# Patient Record
Sex: Male | Born: 1955 | Race: White | Hispanic: No | Marital: Married | Smoking: Former smoker
Health system: Southern US, Community
[De-identification: ages and names within clinical notes are randomized; demographics above are authoritative.]

## PROBLEM LIST (undated history)

## (undated) DIAGNOSIS — M19079 Primary osteoarthritis, unspecified ankle and foot: Secondary | ICD-10-CM

## (undated) DIAGNOSIS — I82409 Acute embolism and thrombosis of unspecified deep veins of unspecified lower extremity: Secondary | ICD-10-CM

## (undated) DIAGNOSIS — Z5189 Encounter for other specified aftercare: Secondary | ICD-10-CM

## (undated) DIAGNOSIS — D689 Coagulation defect, unspecified: Secondary | ICD-10-CM

## (undated) DIAGNOSIS — R519 Headache, unspecified: Secondary | ICD-10-CM

## (undated) DIAGNOSIS — R51 Headache: Secondary | ICD-10-CM

## (undated) DIAGNOSIS — I509 Heart failure, unspecified: Secondary | ICD-10-CM

## (undated) DIAGNOSIS — I739 Peripheral vascular disease, unspecified: Secondary | ICD-10-CM

## (undated) DIAGNOSIS — M545 Low back pain, unspecified: Secondary | ICD-10-CM

## (undated) DIAGNOSIS — Z944 Liver transplant status: Secondary | ICD-10-CM

## (undated) DIAGNOSIS — K635 Polyp of colon: Secondary | ICD-10-CM

## (undated) DIAGNOSIS — R030 Elevated blood-pressure reading, without diagnosis of hypertension: Secondary | ICD-10-CM

## (undated) DIAGNOSIS — T7840XA Allergy, unspecified, initial encounter: Secondary | ICD-10-CM

## (undated) DIAGNOSIS — C801 Malignant (primary) neoplasm, unspecified: Secondary | ICD-10-CM

## (undated) DIAGNOSIS — S32000A Wedge compression fracture of unspecified lumbar vertebra, initial encounter for closed fracture: Secondary | ICD-10-CM

## (undated) DIAGNOSIS — I2699 Other pulmonary embolism without acute cor pulmonale: Secondary | ICD-10-CM

## (undated) DIAGNOSIS — K7460 Unspecified cirrhosis of liver: Secondary | ICD-10-CM

## (undated) DIAGNOSIS — E119 Type 2 diabetes mellitus without complications: Secondary | ICD-10-CM

## (undated) HISTORY — DX: Polyp of colon: K63.5

## (undated) HISTORY — DX: Acute embolism and thrombosis of unspecified deep veins of unspecified lower extremity: I82.409

## (undated) HISTORY — DX: Primary osteoarthritis, unspecified ankle and foot: M19.079

## (undated) HISTORY — DX: Coagulation defect, unspecified: D68.9

## (undated) HISTORY — DX: Low back pain: M54.5

## (undated) HISTORY — PX: FOOT SURGERY: SHX648

## (undated) HISTORY — PX: LAMINECTOMY: SHX219

## (undated) HISTORY — DX: Low back pain, unspecified: M54.50

## (undated) HISTORY — DX: Wedge compression fracture of unspecified lumbar vertebra, initial encounter for closed fracture: S32.000A

## (undated) HISTORY — PX: FRACTURE SURGERY: SHX138

## (undated) HISTORY — DX: Liver transplant status: Z94.4

## (undated) HISTORY — DX: Allergy, unspecified, initial encounter: T78.40XA

## (undated) HISTORY — PX: LIVER TRANSPLANT: SHX410

## (undated) HISTORY — DX: Encounter for other specified aftercare: Z51.89

## (undated) HISTORY — PX: HAND SURGERY: SHX662

## (undated) HISTORY — DX: Elevated blood-pressure reading, without diagnosis of hypertension: R03.0

## (undated) HISTORY — DX: Other pulmonary embolism without acute cor pulmonale: I26.99

## (undated) HISTORY — DX: Type 2 diabetes mellitus without complications: E11.9

---

## 1998-05-08 ENCOUNTER — Ambulatory Visit (HOSPITAL_BASED_OUTPATIENT_CLINIC_OR_DEPARTMENT_OTHER): Admission: RE | Admit: 1998-05-08 | Discharge: 1998-05-08 | Payer: Self-pay | Admitting: Orthopedic Surgery

## 2008-04-28 ENCOUNTER — Inpatient Hospital Stay: Payer: Self-pay | Admitting: Internal Medicine

## 2008-04-28 IMAGING — CR DG CHEST 2V
1 series · 2 of 2 positions shown · non-contrast
Comparison: none

REASON FOR EXAM: Chest pain
COMMENTS:

PROCEDURE:     DXR - DXR CHEST PA (OR AP) AND LATERAL  - [DATE]  [DATE]
RESULT:     The lungs are adequately inflated. There is no focal infiltrate.
The heart is normal in size. The pulmonary vascularity is not engorged.

[Series 1: view not recorded · 0.17mm/px · 2 of 2 slices shown]
[im 1/2]
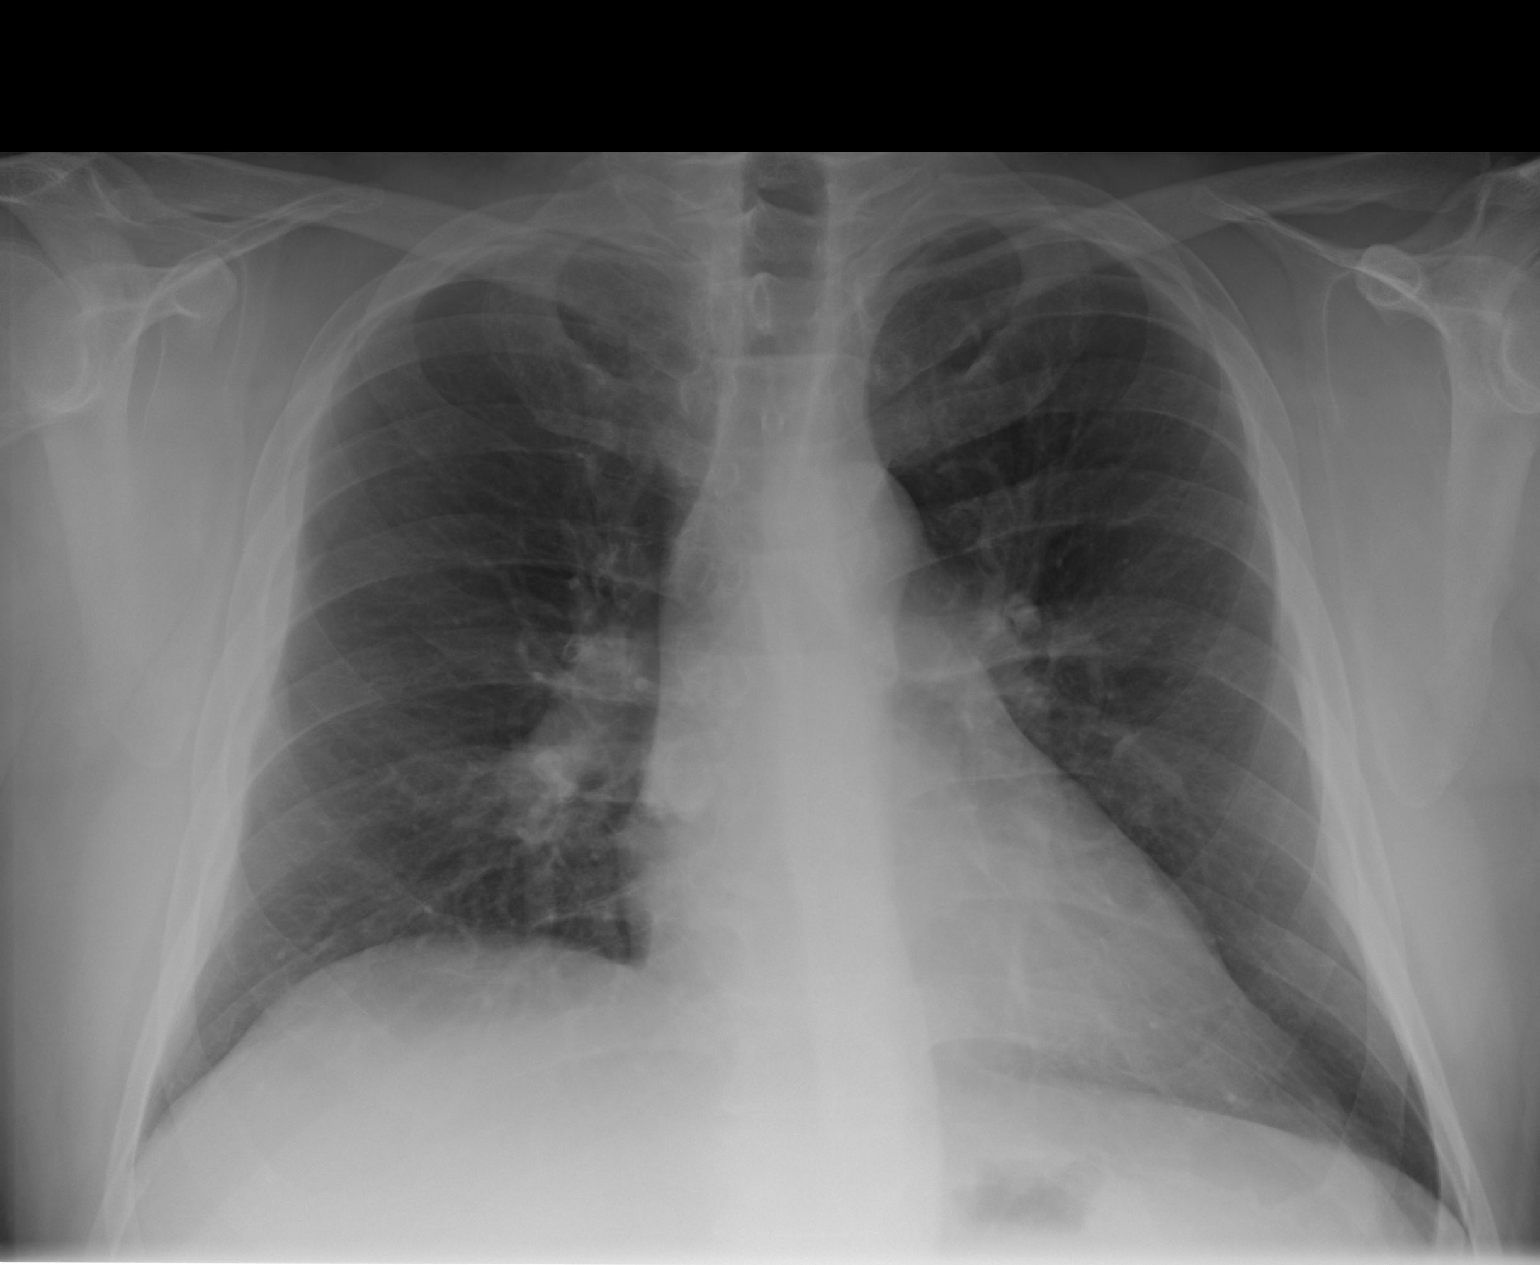
[im 2/2]
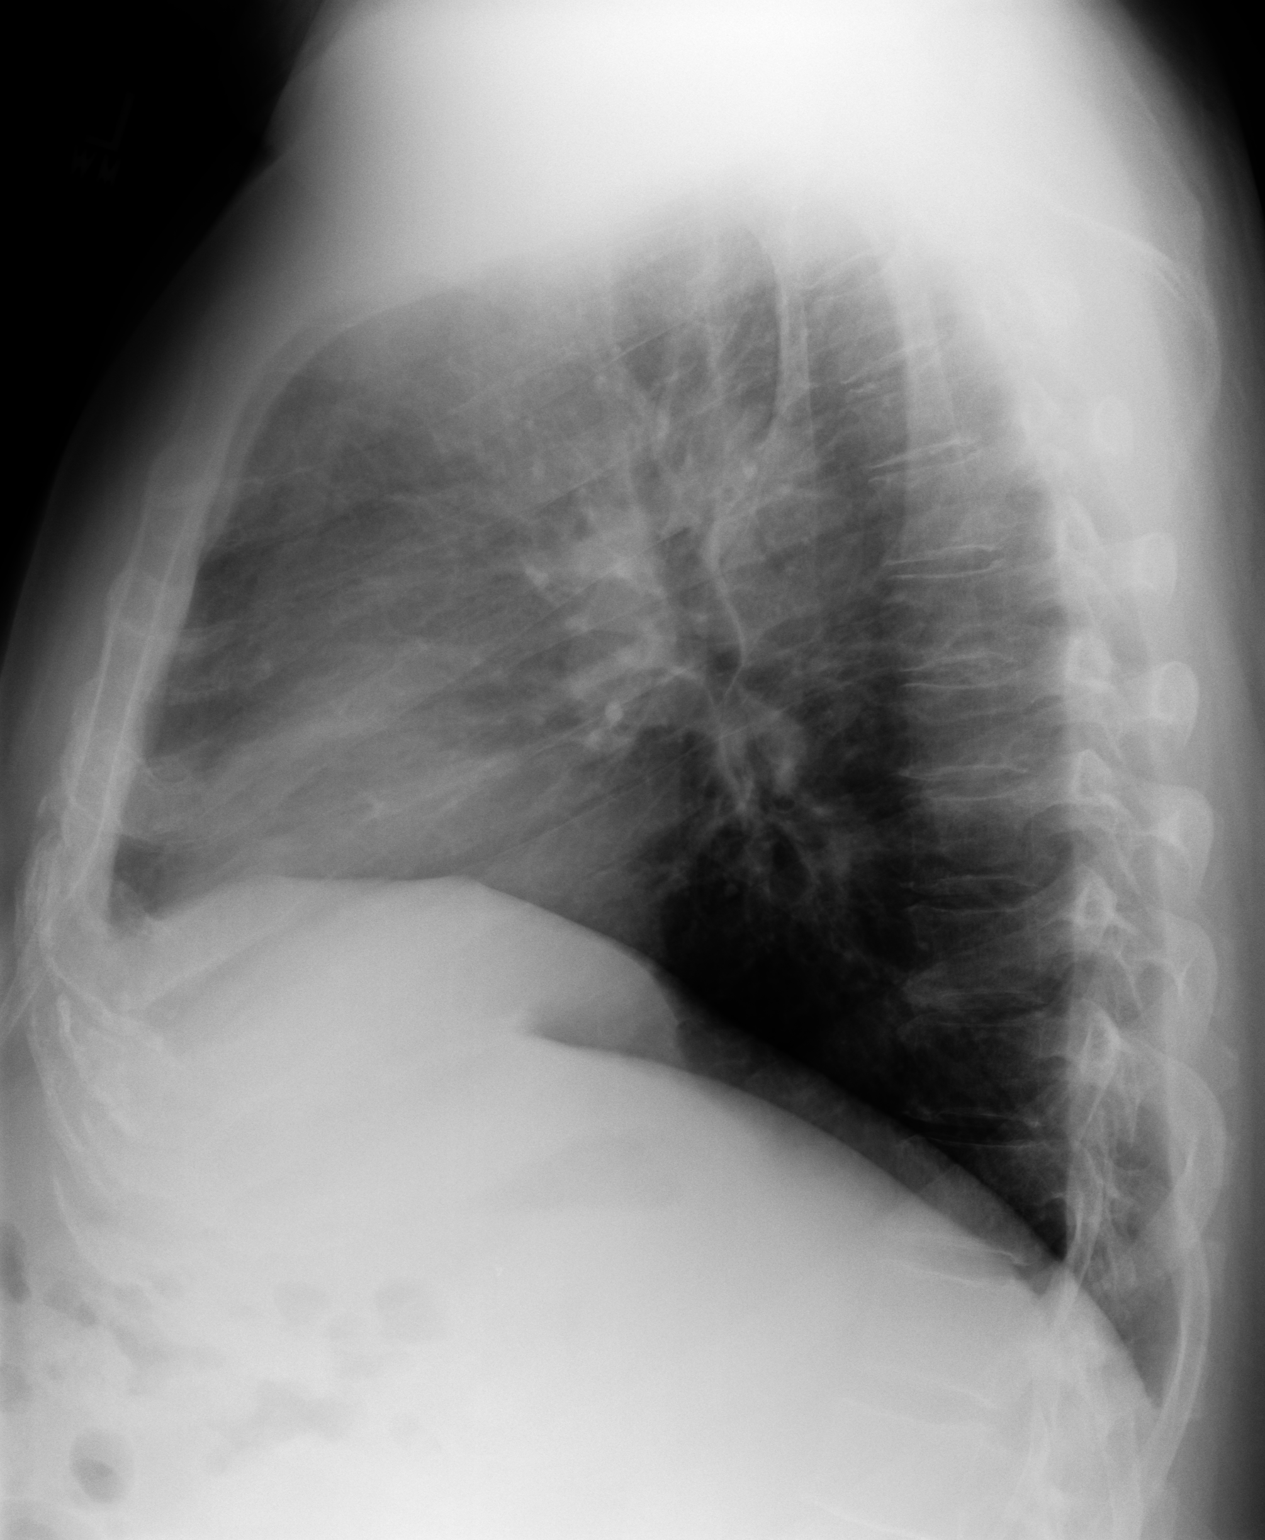

[2 of 2 positions shown; findings below may reference images not displayed]

IMPRESSION: I do not see evidence of acute cardiopulmonary abnormality.
Follow-up imaging is available if the patient's symptoms persist and remain
unexplained.

## 2008-04-28 IMAGING — CT CT CHEST W/O CM
1 series · 15 of 33 positions shown, 19 images · non-contrast
Comparison: none

REASON FOR EXAM: JIM lymphadenopathy allergic to dye
COMMENTS:

PROCEDURE:     CT  - CT CHEST WITHOUT CONTRAST  - [DATE] [DATE]
RESULT:     Comparison: No comparison
INDICATION: Chest pain
TECHNIQUE: Multiple axial images of the chest are obtained without
intravenous contrast.

[Series 3: soft tissue · axial · 0.84mm/px · z∈[+126,+416]mm · 15 of 70 slices shown, 19 images]
[im 6/70  mediastinal]
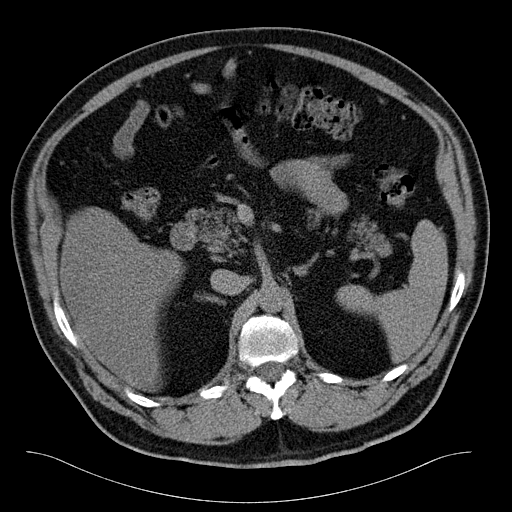
[im 6/70  lung]
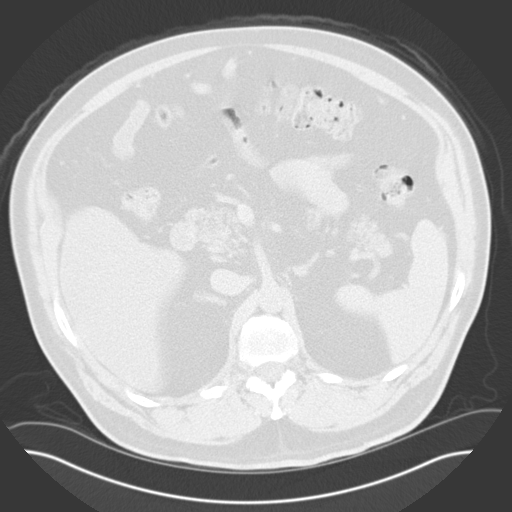
[im 11/70  lung]
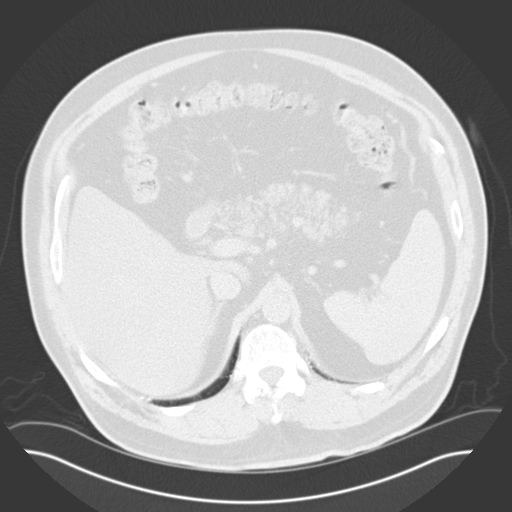
[im 14/70  lung]
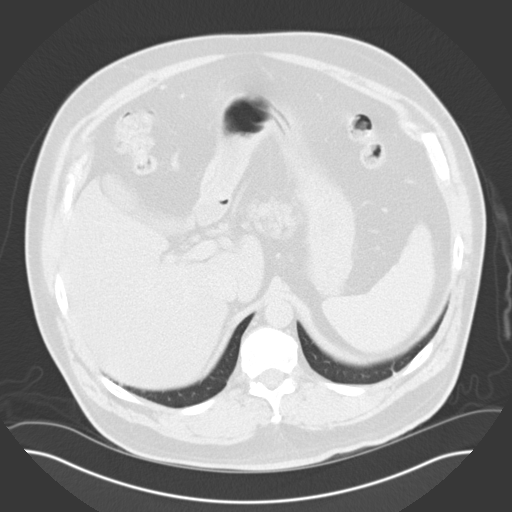
[im 18/70  lung]
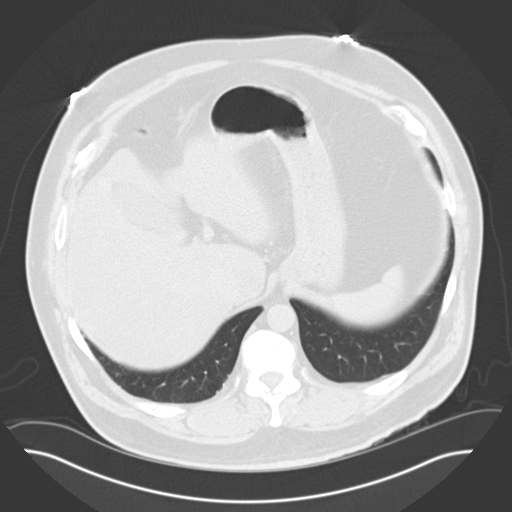
[im 24/70  mediastinal]
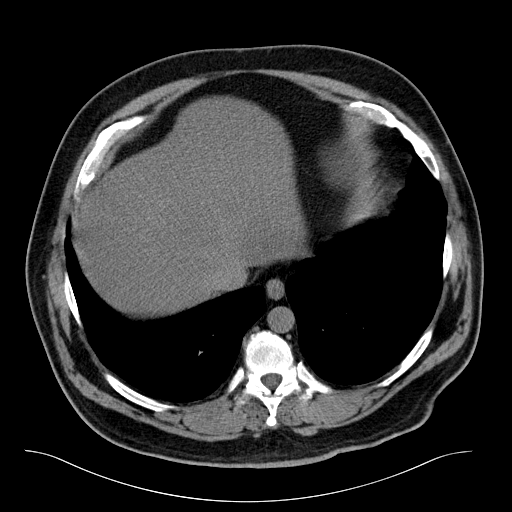
[im 24/70  lung]
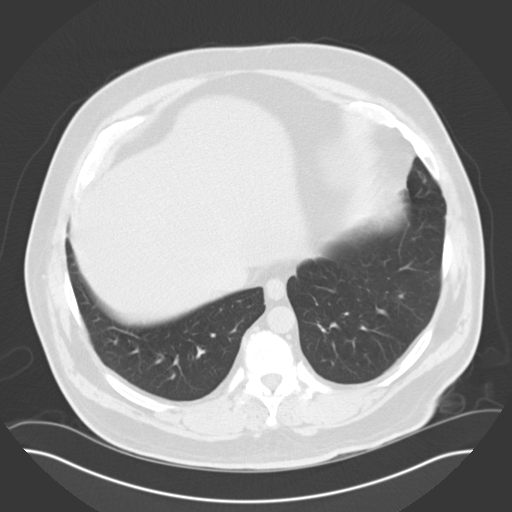
[im 28/70  lung]
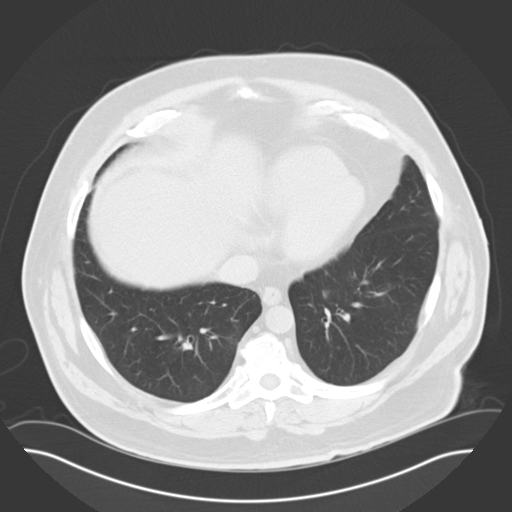
[im 31/70  lung]
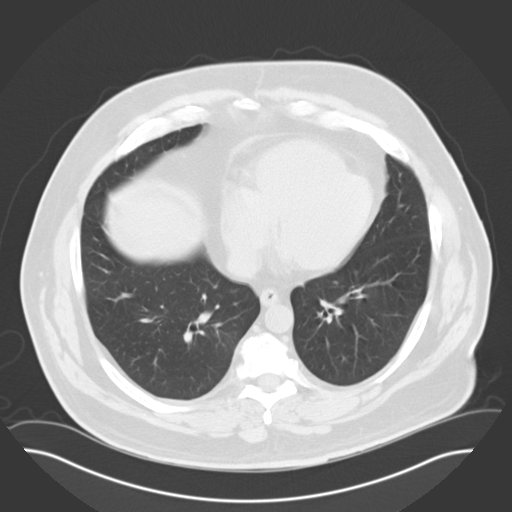
[im 36/70  lung]
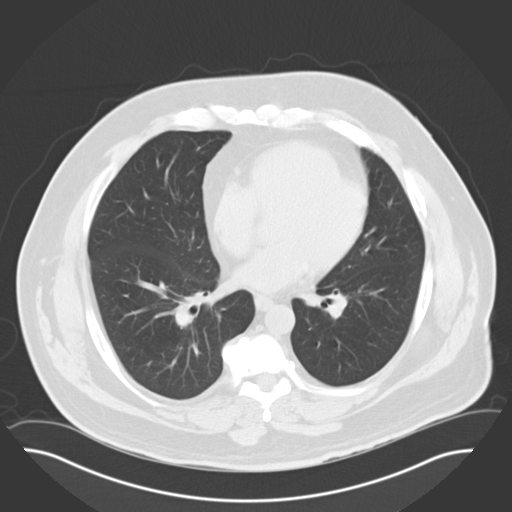
[im 39/70  mediastinal]
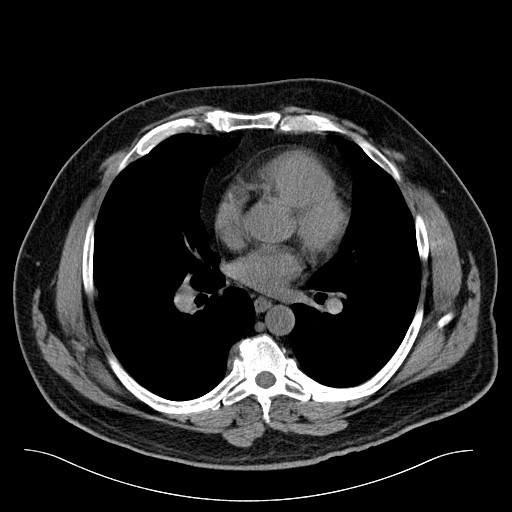
[im 39/70  lung]
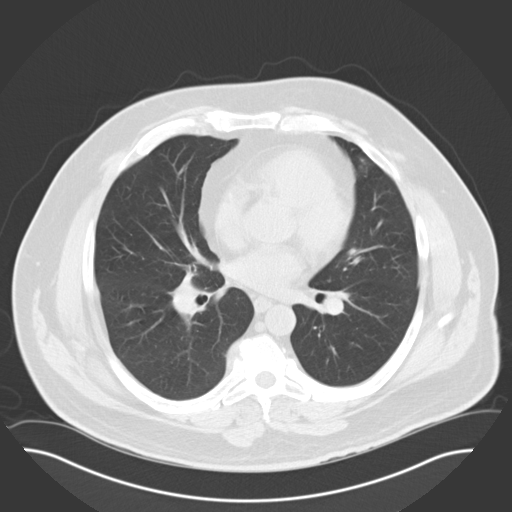
[im 42/70  lung]
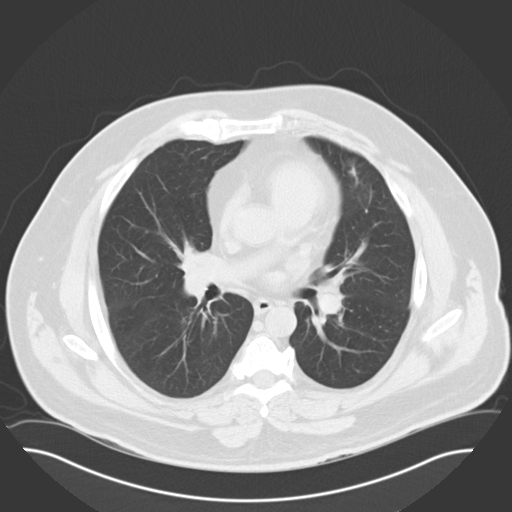
[im 47/70  lung]
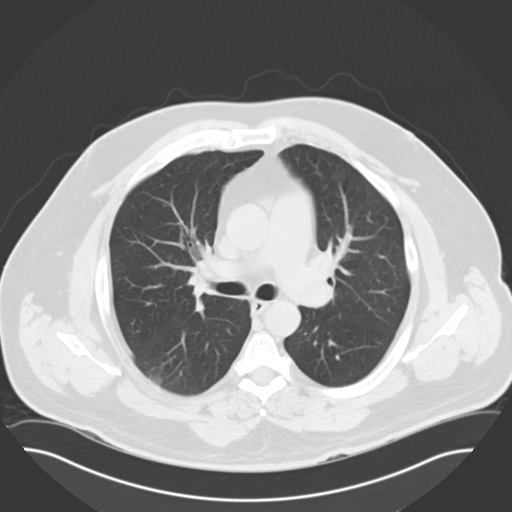
[im 52/70  lung]
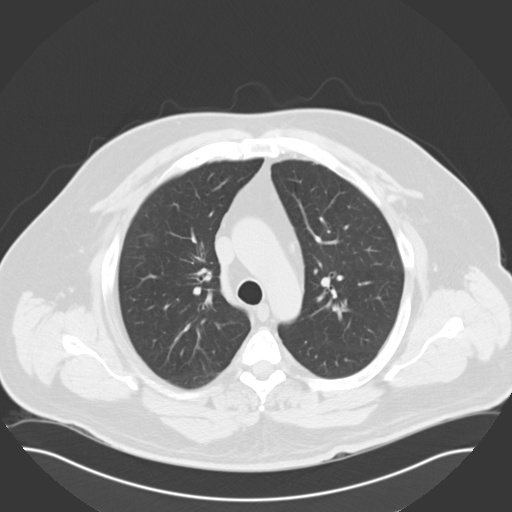
[im 56/70  mediastinal]
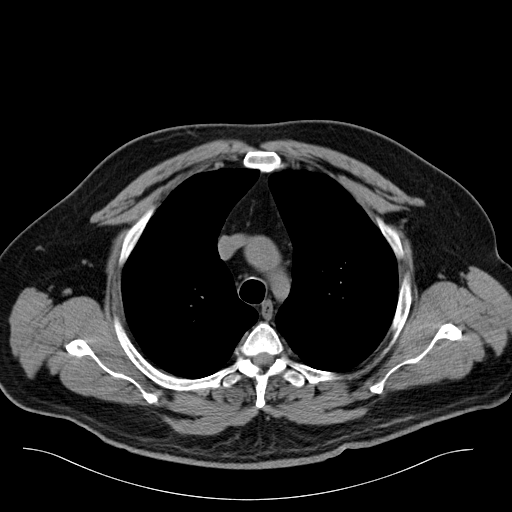
[im 56/70  lung]
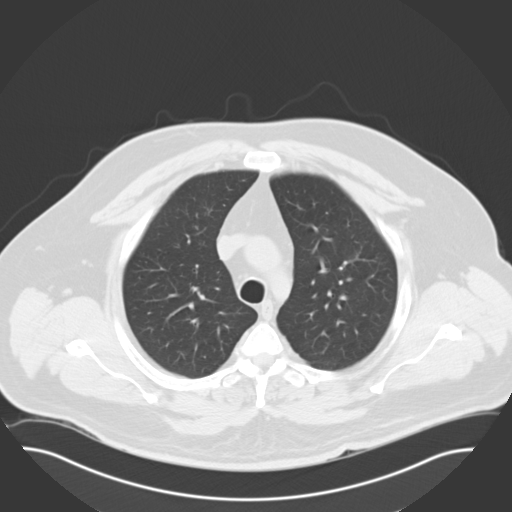
[im 59/70  lung]
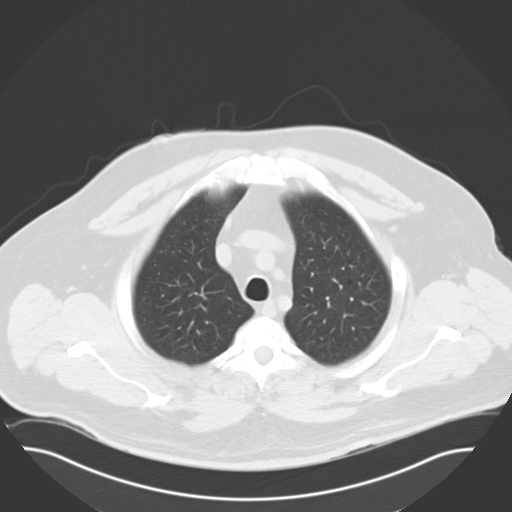
[im 64/70  lung]
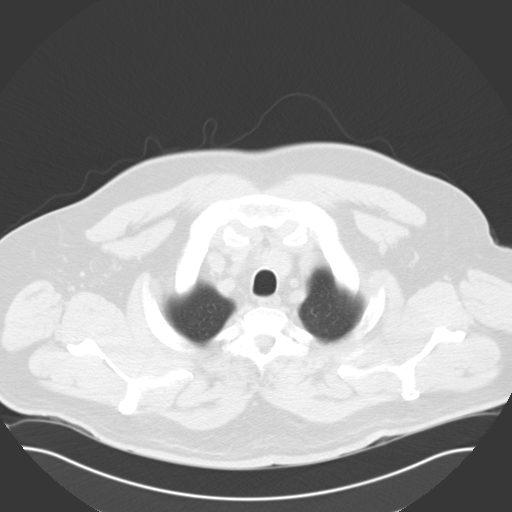

[15 of 33 positions shown; findings below may reference images not displayed]

FINDINGS: The central airways are patent. There is faint airspace disease in the left
upper lobe. There is no pleural effusion or pneumothorax.

There are no pathologically enlarged axillary or mediastinal lymph nodes.
There is no gross hilar lymphadenopathy, but evaluation is limited secondary
to lack of intravenous contrast.

The heart size is normal. There is no pericardial effusion. The thoracic
aorta is normal in caliber.

Review of bone windows demonstrates no focal lytic or sclerotic lesions.

Limited noncontrast images of the upper abdomen were obtained. The adrenal
glands appear normal. The liver is diffusely low in attenuation most
consistent with hepatic steatosis.
IMPRESSION: Small area of faint airspace disease in the left upper lobe which may
represent atelectasis versus developing infiltrate.

## 2008-04-29 IMAGING — NM NM LUNG SCAN
2 series · 14 of 14 positions shown · non-contrast
Comparison: none

REASON FOR EXAM: chest pain, pos d dimer, + dvt
COMMENTS:   LMP: (Male)

[Series 1000: lung perfusion · 1.65mm/px · 3 acquisitions, 6 frames shown]
[im 1/3]
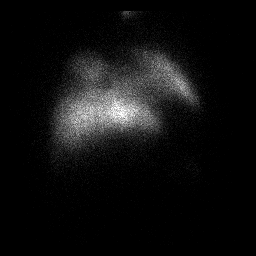
[im 1/3]
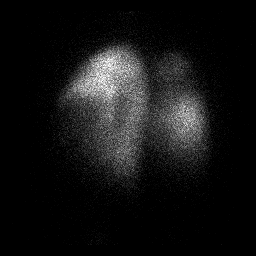
[im 2/3  full-range]
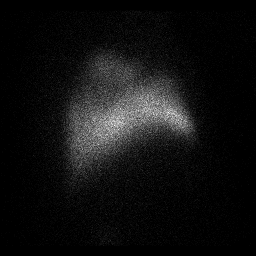
[im 2/3  full-range]
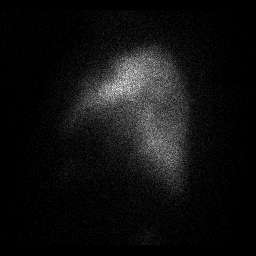
[im 3/3]
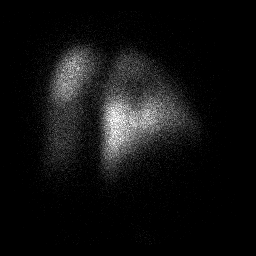
[im 3/3]
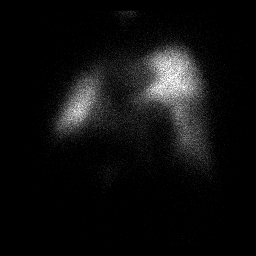

[Series 1000: lung ventilation · 3.30mm/px · 4 acquisitions, 8 frames shown]
[im 1/4]
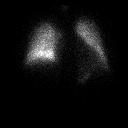
[im 1/4]
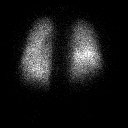
[im 2/4]
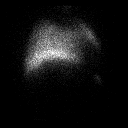
[im 2/4]
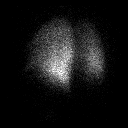
[im 3/4]
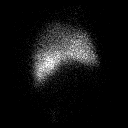
[im 3/4]
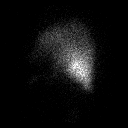
[im 4/4]
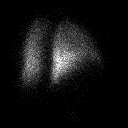
[im 4/4]
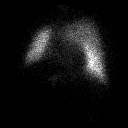

[14 of 14 positions shown; findings below may reference images not displayed]

PROCEDURE:     NM  - NM VQ LUNG SCAN  - [DATE]  [DATE] [DATE]  [DATE]

RESULT:     The patient received 37.2 mCi of technetium 99m labeled DTPA for
the ventilation study via nebulizer. The patient received 4.79 mCi of
technetium 99m labeled MAA for the perfusion study. The patient has known
positive D-dimer and lower extremitydeep venous thrombosis and  is
complaining of chest discomfort. Comparison is made to a chest x-ray [DATE].

There is relatively is symmetric distribution of the radiopharmaceutical
over both lungs on the ventilation study. On the perfusion study however
much of the left mid and lower lung and the right upper lung exhibit
photopenia consistent with pulmonary embolism.
IMPRESSION: The findings are consistent with high probability for acute
pulmonary embolism involving principally the left mid and lower lung and the
right upper lung.

Dr. BOJAN was page with this result at[DATE] on [DATE].at
the conclusion of the study.

Critical Value

## 2008-04-29 IMAGING — US US EXTREM LOW VENOUS BILAT
1 series · 17 of 24 positions shown · non-contrast
Comparison: none

REASON FOR EXAM: Elevated D-Ddimer, evaluate for DVT
COMMENTS:

[Series 1: us extrem low venous bilat · 17 of 45 slices shown]
[im 1/45]
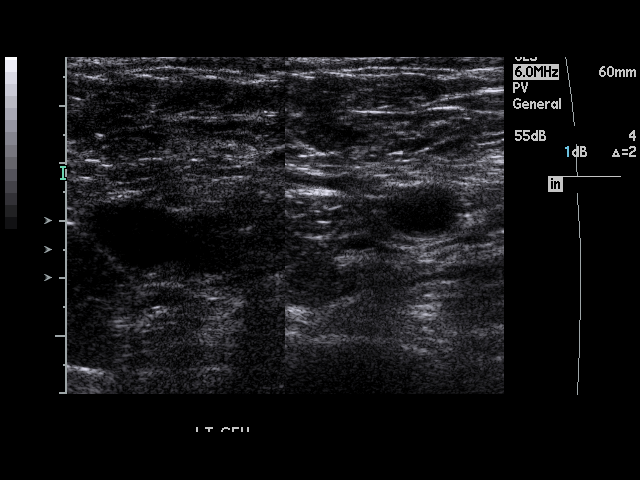
[im 4/45]
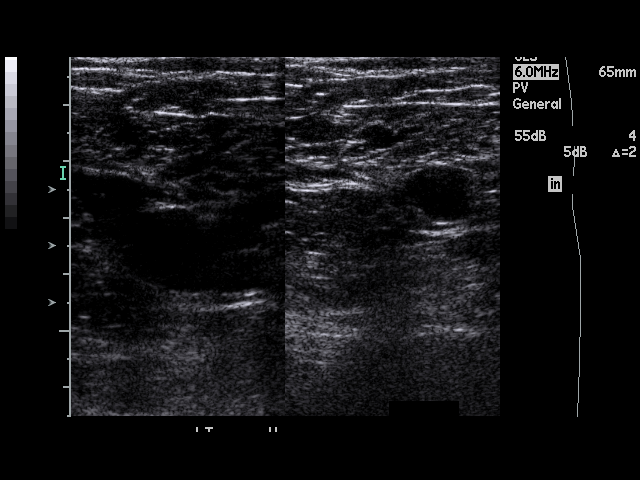
[im 6/45]
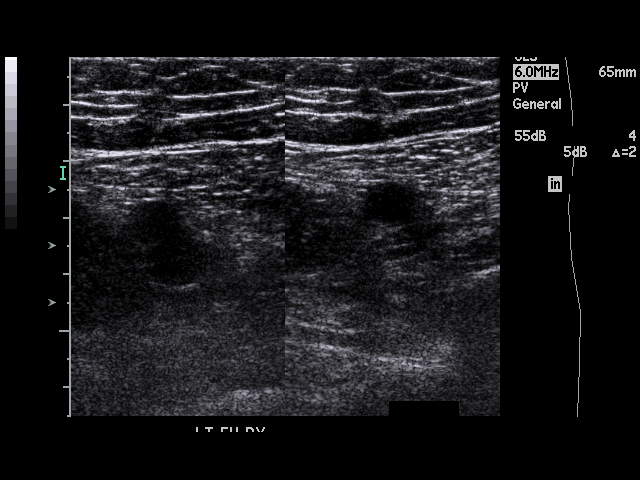
[im 8/45]
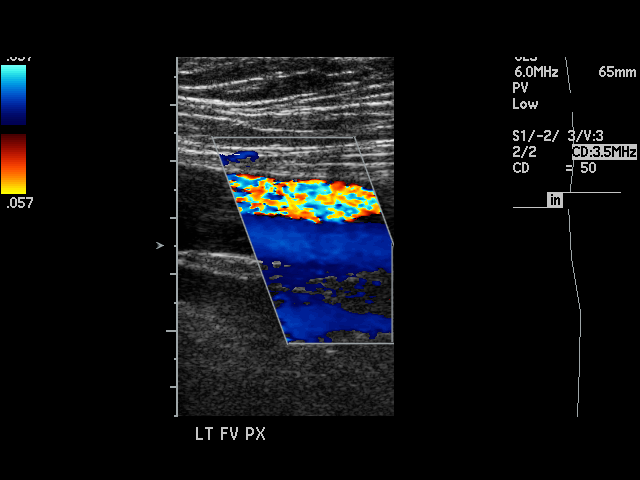
[im 12/45]
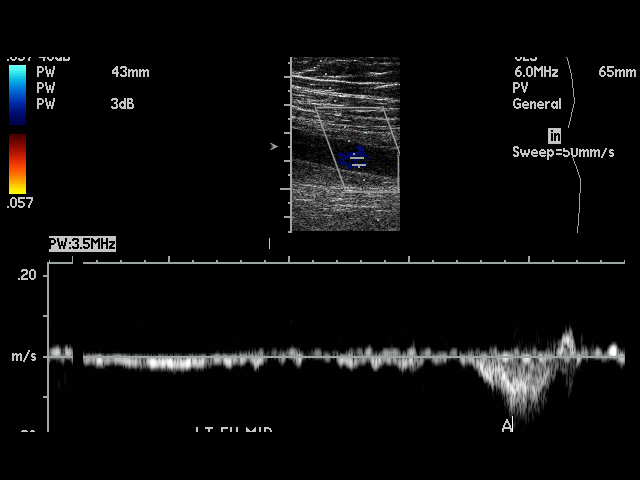
[im 14/45]
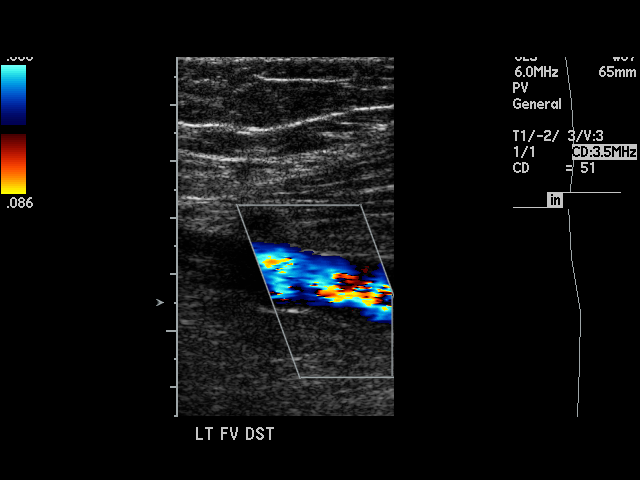
[im 18/45]
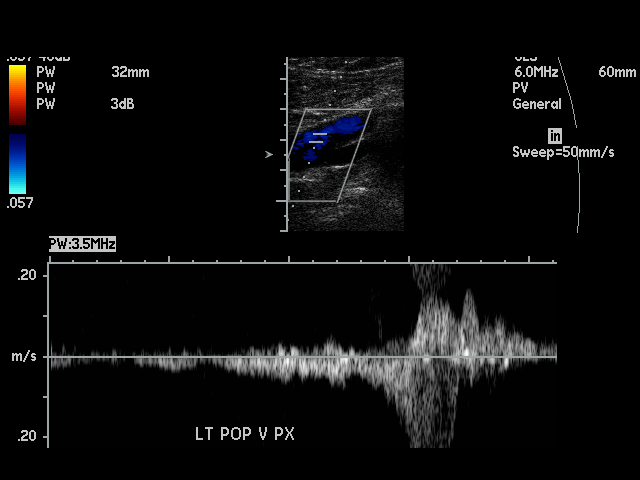
[im 20/45]
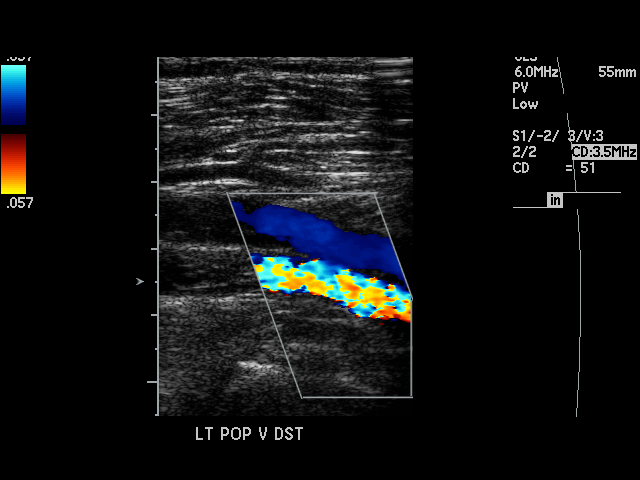
[im 23/45]
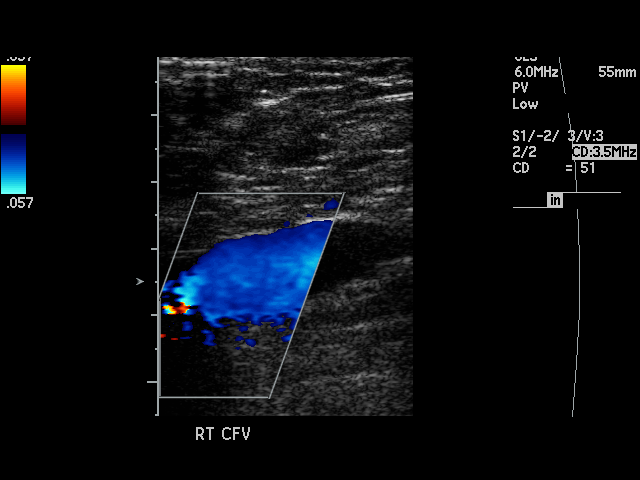
[im 25/45]
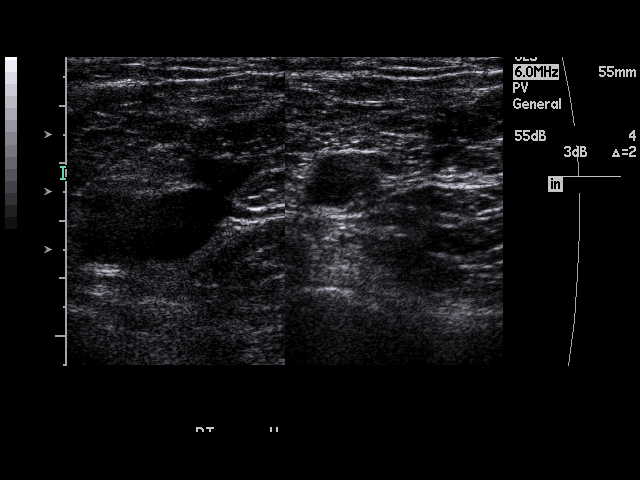
[im 27/45]
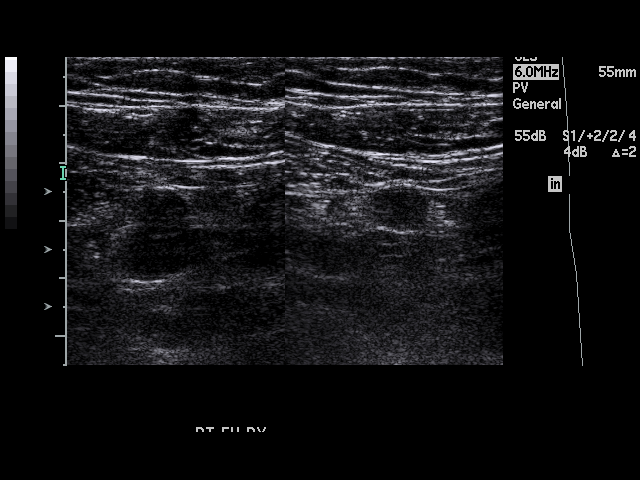
[im 31/45]
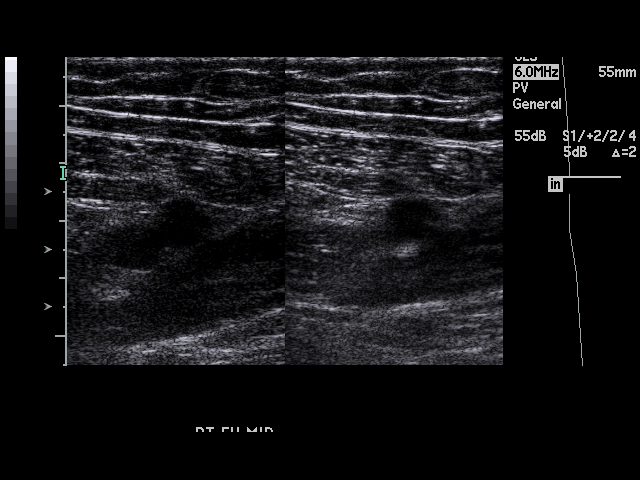
[im 33/45]
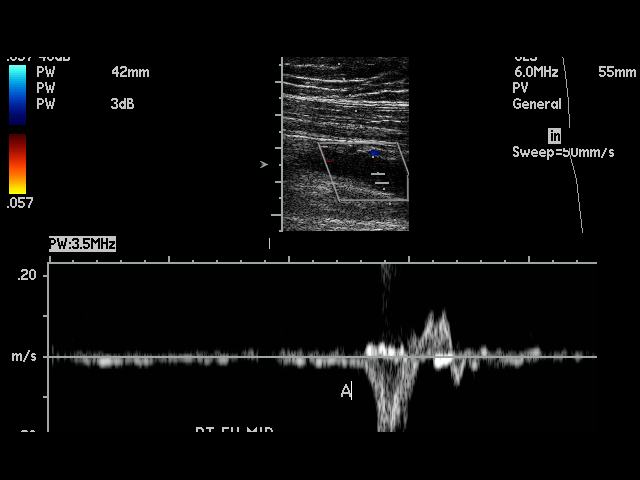
[im 37/45]
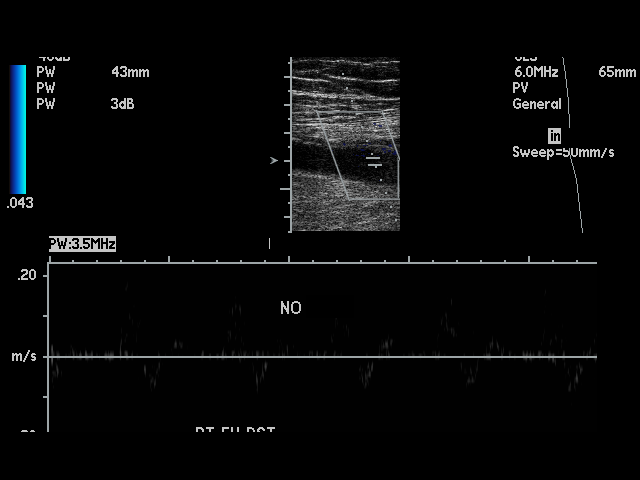
[im 39/45]
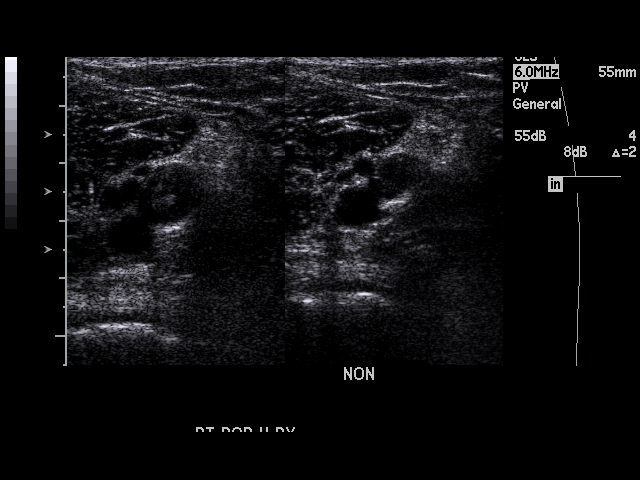
[im 41/45]
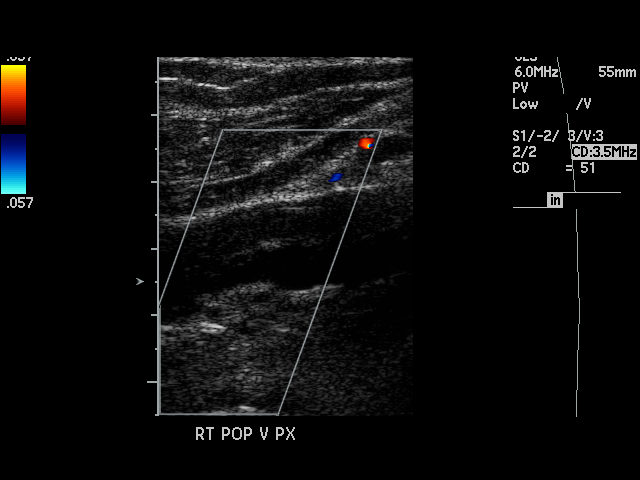
[im 45/45]
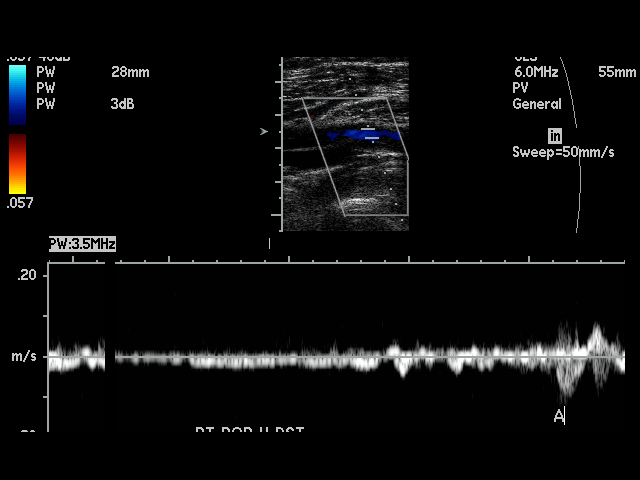

[17 of 24 positions shown; findings below may reference images not displayed]

PROCEDURE:     US  - US DOPPLER LOW EXTR BILATERAL  - [DATE] [DATE]

RESULT:     On the right, there is evidence of thrombus in the distal
superficial femoral vein. This extends into the popliteal vein. The vessel
here is non-compressible and abnormal intraluminal echoes are demonstrated
and the waveform patterns are disrupted. The common femoral vein does not
exhibit evidence of thrombus.

On the left, the common femoral as well as superficial femoral and popliteal
veins are clear.
IMPRESSION: There is obstructing thrombus in the superficial femoral
and popliteal veins on the right. The deep veins of the left lower extremity
appear clear.

## 2010-02-21 ENCOUNTER — Encounter (INDEPENDENT_AMBULATORY_CARE_PROVIDER_SITE_OTHER): Payer: Self-pay | Admitting: *Deleted

## 2010-03-07 ENCOUNTER — Encounter (INDEPENDENT_AMBULATORY_CARE_PROVIDER_SITE_OTHER): Payer: Self-pay | Admitting: *Deleted

## 2010-04-26 ENCOUNTER — Ambulatory Visit: Admit: 2010-04-26 | Payer: Self-pay | Admitting: Gastroenterology

## 2010-05-22 NOTE — Letter (Signed)
Summary: New Patient letter  [facility]  [address] [place], [place] [address]   Phone: [phone]  Fax: [phone]       03/07/2010 MRN: [id number]  Tim Hodge [address] [place], [place]  [address]  Dear Tim Hodge,  Welcome to the Gastroenterology Division at [facility].    You are scheduled to see Dr.  Arlyce Dice on 04-26-09 at 8:30a.m. on the 3rd floor at [facility], [address].  We ask that you try to arrive at our office 15 minutes prior to your appointment time to allow for check-in.  We would like you to complete the enclosed self-administered evaluation form prior to your visit and bring it with you on the day of your appointment.  We will review it with you.  Also, please bring a complete list of all your medications or, if you prefer, bring the medication bottles and we will list them.  Please bring your insurance card so that we may make a copy of it.  If your insurance requires a referral to see a specialist, please bring your referral form from your primary care physician.  Co-payments are due at the time of your visit and may be paid by cash, check or credit card.     Your office visit will consist of a consult with your physician (includes a physical exam), any laboratory testing he/she may order, scheduling of any necessary diagnostic testing (e.g. x-ray, ultrasound, CT-scan), and scheduling of a procedure (e.g. Endoscopy, Colonoscopy) if required.  Please allow enough time on your schedule to allow for any/all of these possibilities.    If you cannot keep your appointment, please call [phone] to cancel or reschedule prior to your appointment date.  This allows [place] the opportunity to schedule an appointment for another patient in need of care.  If you do not cancel or reschedule by 5 p.m. the business day prior to your appointment date, you will be charged a $50.00 late cancellation/no-show fee.    Thank you for choosing [facility] for your medical needs.  We appreciate the opportunity to care for you.  Please visit [place] at our website  to learn more about our practice.                     Sincerely,                                                             The Gastroenterology Division

## 2010-05-22 NOTE — Letter (Signed)
Summary: Pre Visit Letter Revised  [facility]  [address] [place], [place] [address]   Phone: [phone]  Fax: [phone]        02/21/2010 MRN: [id number] Tim Hodge [address] [place], [place]  [address]             Procedure Date:  04-03-10  Welcome to the Gastroenterology Division at [facility].    You are scheduled to see a nurse for your pre-procedure visit on 03-20-10 at 8:00A.M. on the 3rd floor at [facility], [address].  We ask that you try to arrive at our office 15 minutes prior to your appointment time to allow for check-in.  Please take a minute to review the attached form.  If you answer "Yes" to one or more of the questions on the first page, we ask that you call the person listed at your earliest opportunity.  If you answer "No" to all of the questions, please complete the rest of the form and bring it to your appointment.    Your nurse visit will consist of discussing your medical and surgical history, your immediate family medical history, and your medications.   If you are unable to list all of your medications on the form, please bring the medication bottles to your appointment and we will list them.  We will need to be aware of both prescribed and over the counter drugs.  We will need to know exact dosage information as well.    Please be prepared to read and sign documents such as consent forms, a financial agreement, and acknowledgement forms.  If necessary, and with your consent, a friend or relative is welcome to sit-in on the nurse visit with you.  Please bring your insurance card so that we may make a copy of it.  If your insurance requires a referral to see a specialist, please bring your referral form from your primary care physician.  No co-pay is required for this nurse visit.     If you cannot keep your appointment, please call [phone] to cancel or reschedule prior to your appointment date.  This allows [place]  the opportunity to schedule an appointment for another patient in need of care.    Thank you for choosing [facility] for your medical needs.  We appreciate the opportunity to care for you.  Please visit [place] at our website  to learn more about our practice.  Sincerely, The Gastroenterology Division

## 2010-08-08 LAB — HM COLONOSCOPY

## 2012-07-01 ENCOUNTER — Ambulatory Visit: Payer: Self-pay | Admitting: Internal Medicine

## 2012-07-07 IMAGING — US US EXTREM LOW VENOUS*R*
1 series · 14 of 24 positions shown · non-contrast
Comparison: none

REASON FOR EXAM: Follow up DVT
COMMENTS:

[Series 1: us extrem low venous*right* · 0.13mm/px · 14 of 25 slices shown]
[im 1/25]
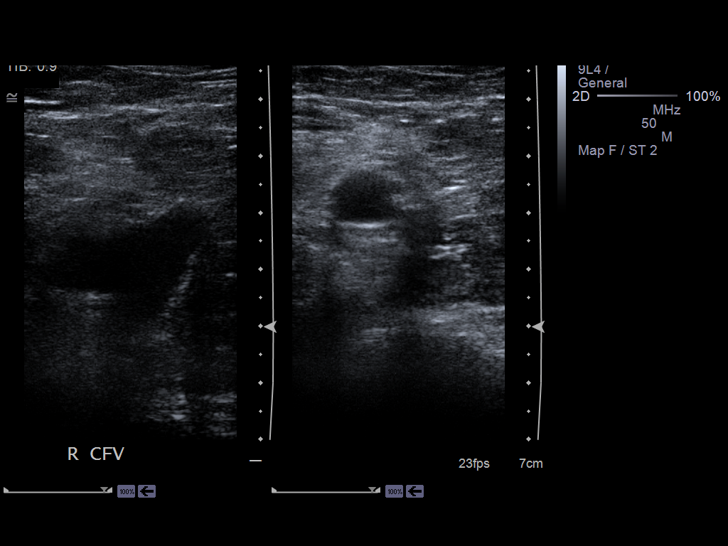
[im 3/25]
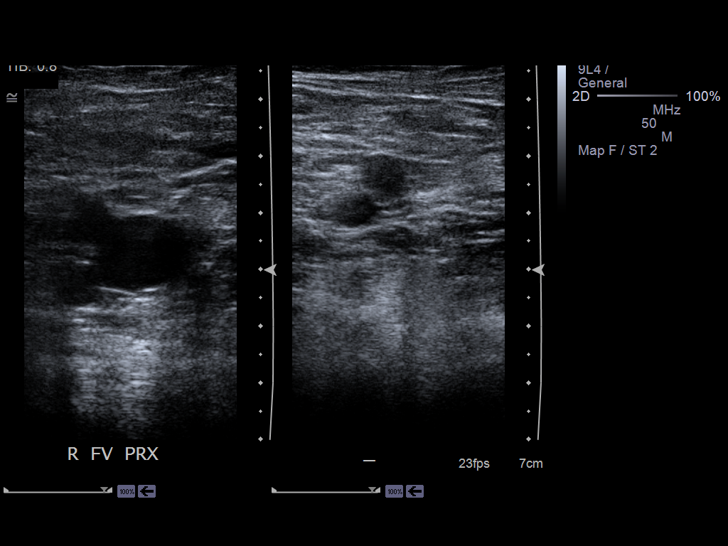
[im 5/25]
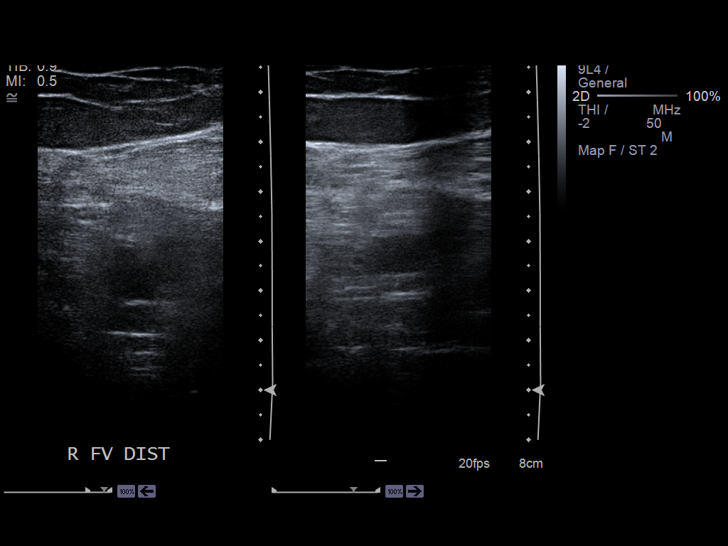
[im 7/25]
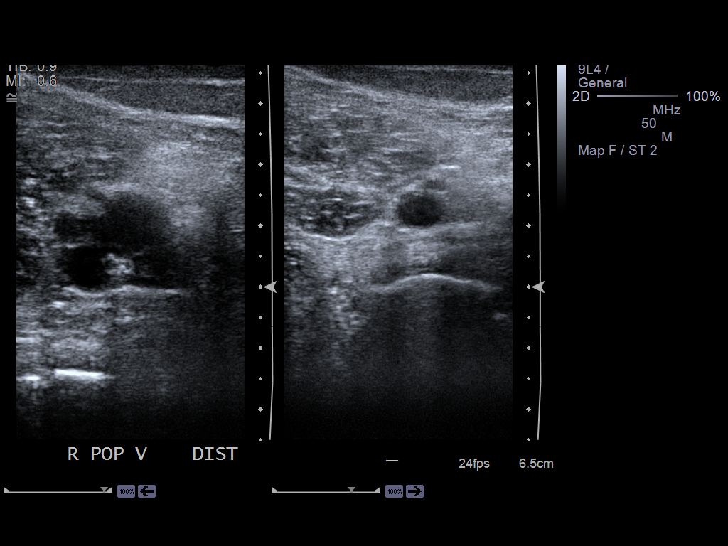
[im 8/25]
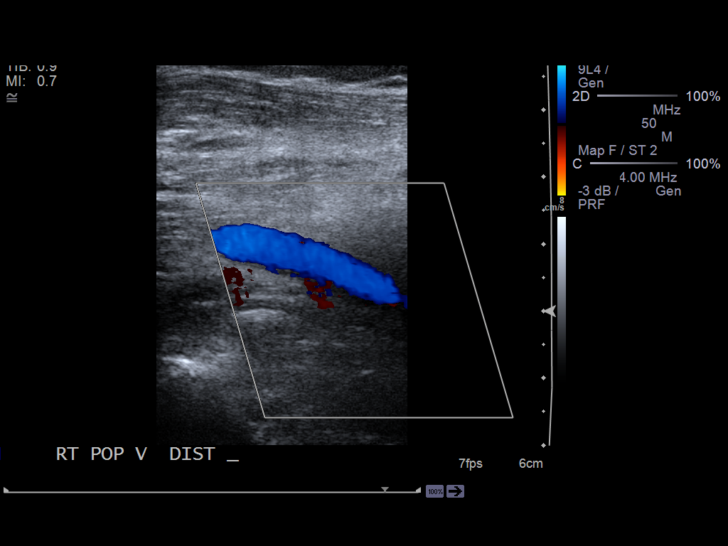
[im 10/25]
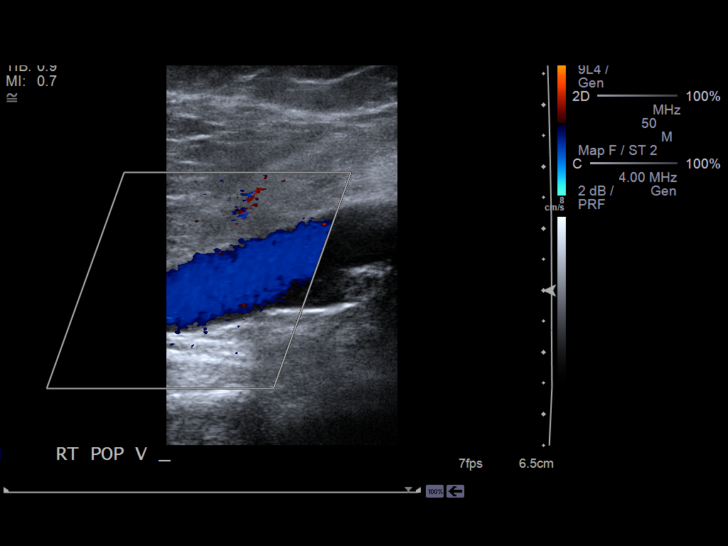
[im 12/25]
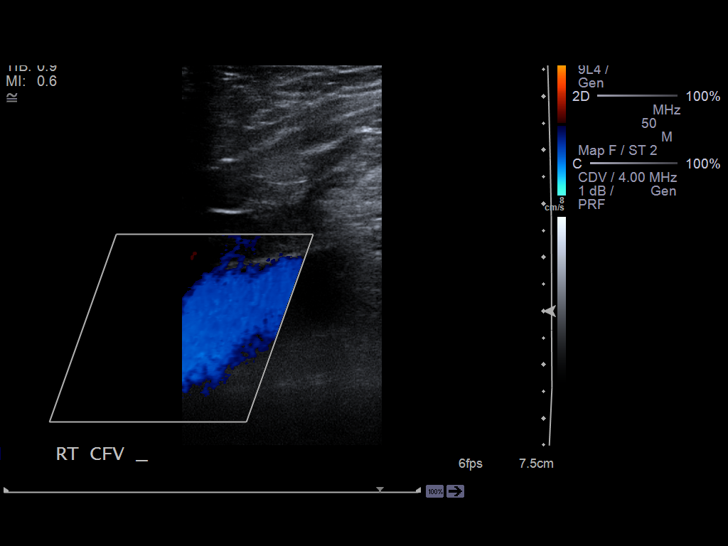
[im 13/25]
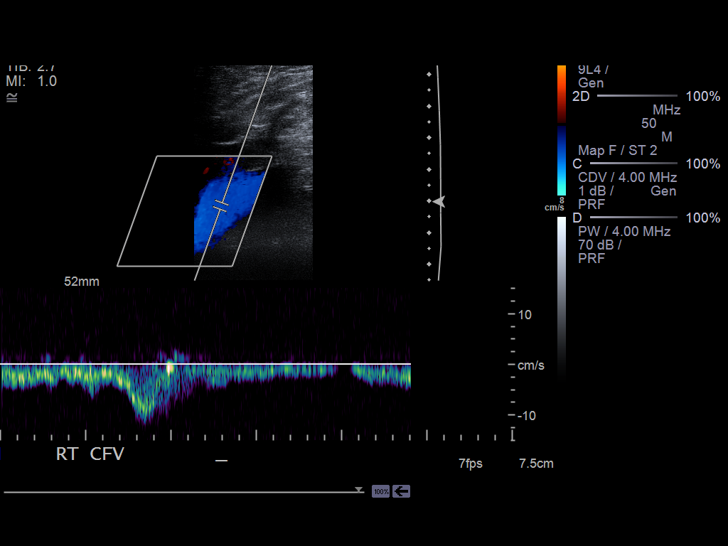
[im 15/25]
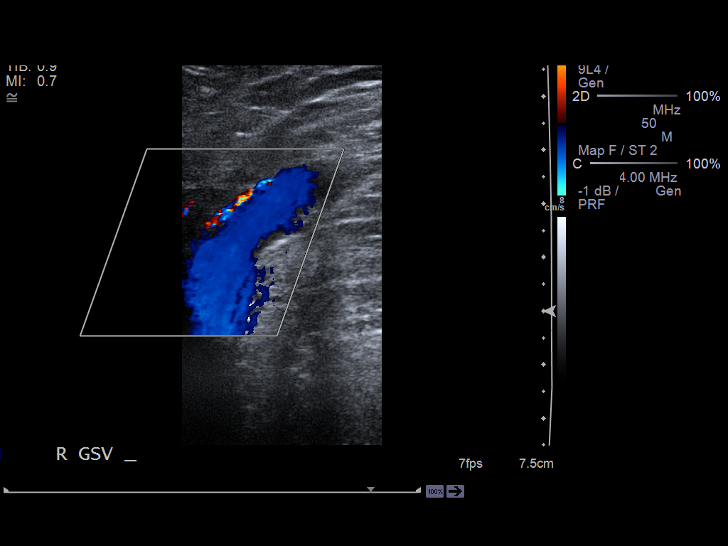
[im 17/25]
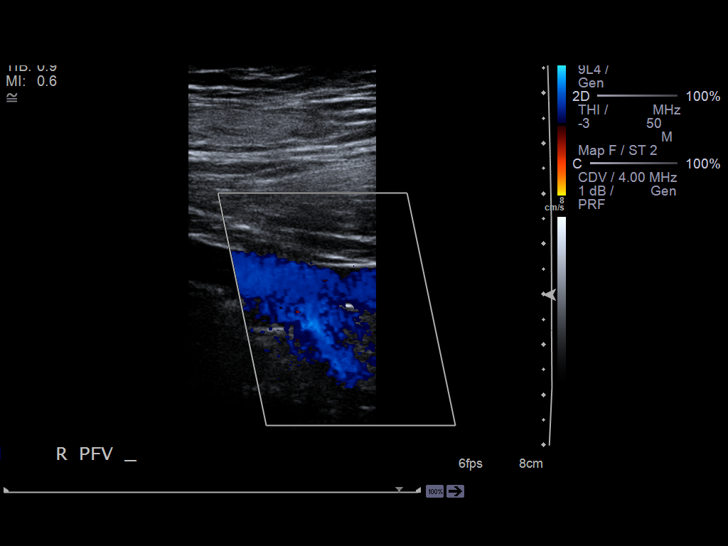
[im 19/25]
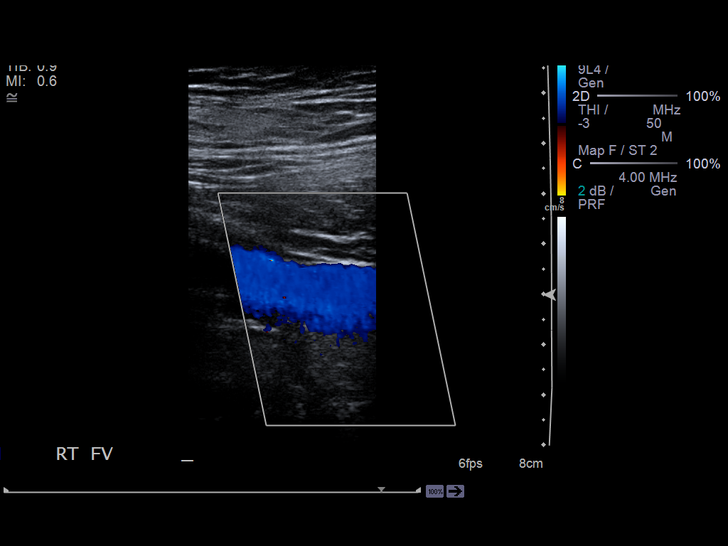
[im 20/25]
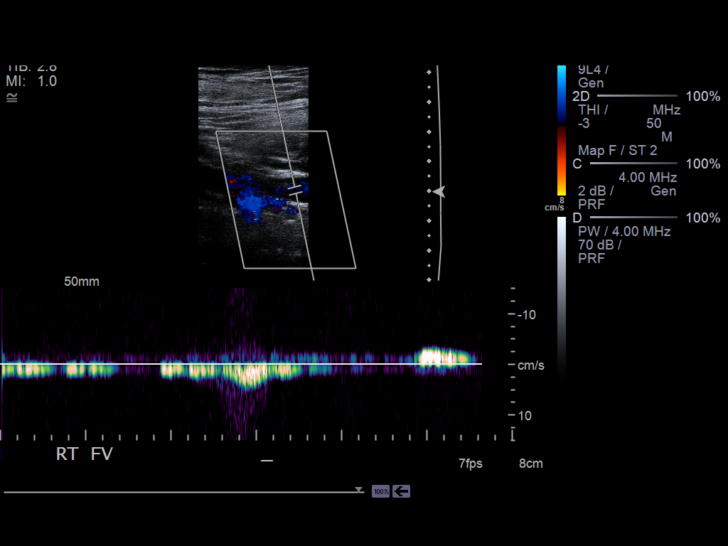
[im 22/25]
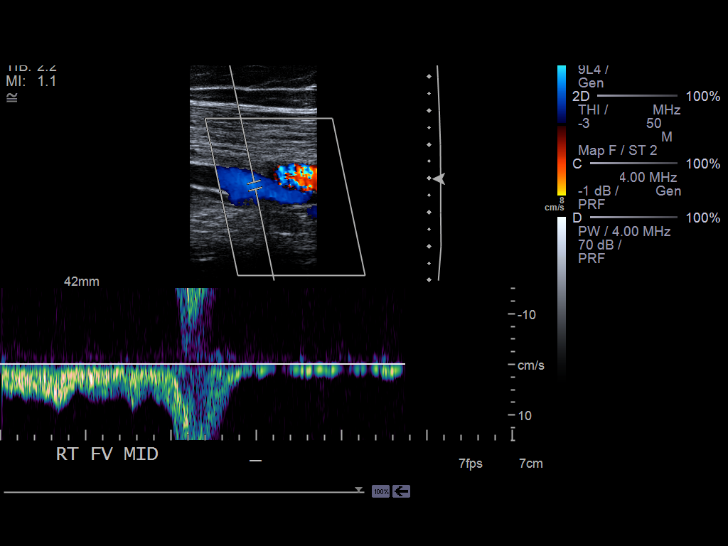
[im 25/25]
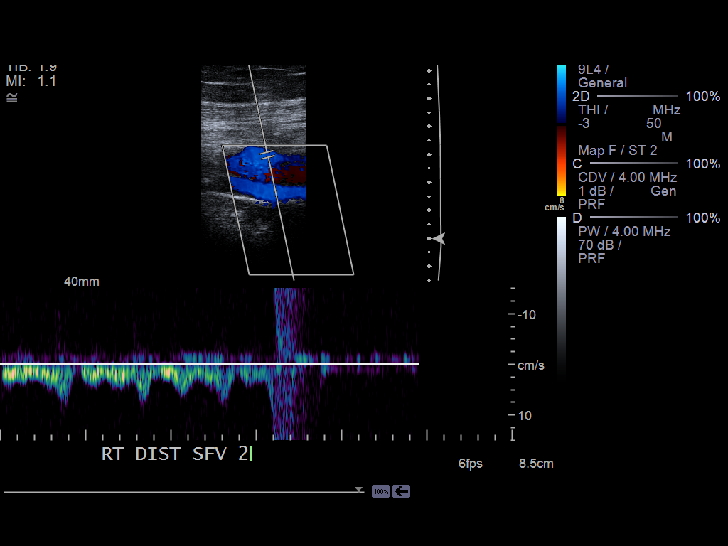

[14 of 24 positions shown; findings below may reference images not displayed]

PROCEDURE:     MARIA CLARA - MARIA CLARA DOPPLER VEINS RT LEG EXTR  - [DATE]  [DATE]

RESULT:     Comparison: None

Technique and findings: Multiple longitudinal and transverse grayscale as
well as color and spectral Doppler images of the right lower extremity veins
were obtained from the common femoral veins through the popliteal veins.

The right common femoral, femoral, and popliteal veins are patent,
demonstrating normal color-flow and compressibility. No intraluminal
thrombus is identified.  There is normal respiratory variation and
augmentation demonstrated at all vein levels.
IMPRESSION: No evidence of DVT in the right lower extremity.

## 2012-07-21 ENCOUNTER — Ambulatory Visit: Payer: Self-pay | Admitting: Internal Medicine

## 2013-03-01 ENCOUNTER — Ambulatory Visit (INDEPENDENT_AMBULATORY_CARE_PROVIDER_SITE_OTHER): Payer: BC Managed Care – PPO | Admitting: Cardiovascular Disease

## 2013-03-01 ENCOUNTER — Encounter: Payer: Self-pay | Admitting: *Deleted

## 2013-03-01 ENCOUNTER — Encounter: Payer: Self-pay | Admitting: Cardiovascular Disease

## 2013-03-01 ENCOUNTER — Encounter (INDEPENDENT_AMBULATORY_CARE_PROVIDER_SITE_OTHER): Payer: Self-pay

## 2013-03-01 VITALS — BP 114/79 | HR 80 | Ht 73.0 in | Wt 307.0 lb

## 2013-03-01 DIAGNOSIS — I4949 Other premature depolarization: Secondary | ICD-10-CM

## 2013-03-01 DIAGNOSIS — R0600 Dyspnea, unspecified: Secondary | ICD-10-CM

## 2013-03-01 DIAGNOSIS — R0989 Other specified symptoms and signs involving the circulatory and respiratory systems: Secondary | ICD-10-CM

## 2013-03-01 DIAGNOSIS — R0609 Other forms of dyspnea: Secondary | ICD-10-CM

## 2013-03-01 DIAGNOSIS — R9431 Abnormal electrocardiogram [ECG] [EKG]: Secondary | ICD-10-CM

## 2013-03-01 DIAGNOSIS — I493 Ventricular premature depolarization: Secondary | ICD-10-CM | POA: Insufficient documentation

## 2013-03-01 NOTE — Assessment & Plan Note (Signed)
Patient has an abnormal EKG with left anterior fascicular block with PVCs. His symptoms included exertional dyspnea without chest discomfort. I recommend a treadmill nuclear stress test for evaluation. A treadmill stress test unknown is not sufficient due to abnormal baseline EKG.

## 2013-03-01 NOTE — Progress Notes (Signed)
Primary care physician: Dr. Marguerite Olea  HPI  This is a 57 year old man who was referred for evaluation of abnormal EKG and PVCs. 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 Other chronic medical conditions include type 2 diabetes, hypertension and obesity. He has no previous cardiac history. He was noted recently to have irregular heartbeats. EKG showed sinus rhythm with a PVC. He denies any chest discomfort. He has chronic exertional dyspnea with no recent worsening. No orthopnea or PND. He denies dizziness, syncope or presyncope. He is a former smoker. There is no family history of premature coronary artery disease or arrhythmia. He consumes one cup of coffee a day only.  Allergies  Allergen Reactions  . Iodinated Diagnostic Agents   . Metformin And Related   . Vioxx [Rofecoxib]      No current outpatient prescriptions on file prior to visit.   No current facility-administered medications on file prior to visit.     Past Medical History  Diagnosis Date  . Unspecified arthropathy, ankle and foot   . Elevated blood pressure reading without diagnosis of hypertension   . Pulmonary embolism   . Lumbago   . Acute venous embolism and thrombosis of unspecified deep vessels of lower extremity   . Type II or unspecified type diabetes mellitus without mention of complication, not stated as uncontrolled   . Polyp of colon   . Clotting disorder     Hx lung blood clot     Past Surgical History  Procedure Laterality Date  . Foot surgery Left   . Hand surgery Right      Family History  Problem Relation Age of Onset  . Breast cancer Mother   . Alzheimer's disease Mother   . Breast cancer Sister   . Stroke Father      History   Social History  . Marital Status: Single    Spouse Name: N/A    Number of Children: N/A  . Years of Education: N/A   Occupational History  . Not on file.   Social History Main Topics  .  Smoking status: Former Smoker    Types: Cigarettes  . Smokeless tobacco: Not on file  . Alcohol Use: Yes     Comment: occassional  . Drug Use: No  . Sexual Activity: Not on file   Other Topics Concern  . Not on file   Social History Narrative  . No narrative on file     ROS A 10 point review of system was performed. It is negative other than that mentioned in the history of present illness.   PHYSICAL EXAM   BP 114/79  Pulse 80  Ht 6\' 1"  (1.854 m)  Wt 307 lb (139.254 kg)  BMI 40.51 kg/m2 Constitutional: He is oriented to person, place, and time. He appears well-developed and well-nourished. No distress.  HENT: No nasal discharge.  Head: Normocephalic and atraumatic.  Eyes: Pupils are equal and round.  No discharge. Neck: Normal range of motion. Neck supple. No JVD present. No thyromegaly present.  Cardiovascular: Normal rate, regular rhythm, normal heart sounds. Exam reveals no gallop and no friction rub. No murmur heard.  Pulmonary/Chest: Effort normal and breath sounds normal. No stridor. No respiratory distress. He has no wheezes. He has no rales. He exhibits no tenderness.  Abdominal: Soft. Bowel sounds are normal. He exhibits no distension. There is no tenderness. There is no rebound and no guarding.  Musculoskeletal: Normal range of motion. He exhibits no  edema and no tenderness.  Neurological: He is alert and oriented to person, place, and time. Coordination normal.  Skin: Skin is warm and dry. No rash noted. He is not diaphoretic. No erythema. No pallor.  Psychiatric: He has a normal mood and affect. His behavior is normal. Judgment and thought content normal.       EKG: Recent EKG was reviewed which showed sinus rhythm with a PVC and left anterior fascicular block.   ASSESSMENT AND PLAN

## 2013-03-01 NOTE — Assessment & Plan Note (Signed)
Overall, he denies symptoms related to this. We would have to make sure he has no evidence of underlying ischemic or structural heart disease which will be evaluated as outlined above. Recent TSH was normal. He does not consume excessive amounts of caffeine. If stress test is unremarkable, no indication to treat this. Previous echocardiogram in 2000 and showed normal LV systolic function with mild left ventricular hypertrophy and no evidence of pulmonary hypertension.

## 2013-03-01 NOTE — Patient Instructions (Addendum)
Your physician has requested that you have en exercise stress myoview. For further information please visit [email or web address] Please follow instruction sheet, as given.  Follow up as needed.   [facility] MYOVIEW  Your caregiver has ordered a Stress Test with nuclear imaging. The purpose of this test is to evaluate the blood supply to your heart muscle. This procedure is referred to as a "Non-Invasive Stress Test." This is because other than having an IV started in your vein, nothing is inserted or "invades" your body. Cardiac stress tests are done to find areas of poor blood flow to the heart by determining the extent of coronary artery disease (CAD). Some patients exercise on a treadmill, which naturally increases the blood flow to your heart, while others who are  unable to walk on a treadmill due to physical limitations have a pharmacologic/chemical stress agent called Lexiscan . This medicine will mimic walking on a treadmill by temporarily increasing your coronary blood flow.   Please note: these test may take anywhere between 2-4 hours to complete  PLEASE REPORT TO [facility] MEDICAL MALL ENTRANCE  THE VOLUNTEERS AT THE FIRST DESK WILL DIRECT YOU WHERE TO GO  Date of Procedure:_____________________________________  Arrival Time for Procedure:______________________________  Instructions regarding medication:   ____ : Hold diabetes medication morning of procedure  ____:  Hold betablocker(s) night before procedure and morning of procedure  ____:  Hold other medications as follows:_________________________________________________________________________________________________________________________________________________________________________________________________________________________________________________________________________________________  PLEASE NOTIFY THE OFFICE AT LEAST 24 HOURS IN ADVANCE IF YOU ARE UNABLE TO KEEP YOUR APPOINTMENT.  [phone] AND  PLEASE NOTIFY NUCLEAR  MEDICINE AT [facility] AT LEAST 24 HOURS IN ADVANCE IF YOU ARE UNABLE TO KEEP YOUR APPOINTMENT. [phone]  How to prepare for your Myoview test:  1. Do not eat or drink after midnight 2. No caffeine for 24 hours prior to test 3. No smoking 24 hours prior to test. 4. Your medication may be taken with water.  If your doctor stopped a medication because of this test, do not take that medication. 5. Ladies, please do not wear dresses.  Skirts or pants are appropriate. Please wear a short sleeve shirt. 6. No perfume, cologne or lotion. 7. Wear comfortable walking shoes. No heels!

## 2013-03-08 ENCOUNTER — Ambulatory Visit: Payer: Self-pay | Admitting: Cardiovascular Disease

## 2013-03-08 DIAGNOSIS — R0602 Shortness of breath: Secondary | ICD-10-CM

## 2013-03-12 ENCOUNTER — Other Ambulatory Visit: Payer: Self-pay

## 2013-03-12 DIAGNOSIS — R0600 Dyspnea, unspecified: Secondary | ICD-10-CM

## 2013-03-22 ENCOUNTER — Other Ambulatory Visit: Payer: Self-pay | Admitting: *Deleted

## 2013-03-22 DIAGNOSIS — I493 Ventricular premature depolarization: Secondary | ICD-10-CM

## 2013-03-22 DIAGNOSIS — R9431 Abnormal electrocardiogram [ECG] [EKG]: Secondary | ICD-10-CM

## 2013-03-23 ENCOUNTER — Telehealth: Payer: Self-pay

## 2013-03-23 NOTE — Telephone Encounter (Signed)
Patient would like to know why he needs to wear a holter monitor. Please advise.

## 2013-03-26 ENCOUNTER — Telehealth: Payer: Self-pay | Admitting: *Deleted

## 2013-03-26 NOTE — Telephone Encounter (Signed)
[name] called and stated did get Dr. Jari Sportsman voice mail regarding his test but would like to wait until the first of the year before he has his holter monitor placed. He will call when he is ready to have the monitor placed.

## 2013-03-26 NOTE — Telephone Encounter (Signed)
Patient stated he received Dr Sheilah Pigeon message and does not wish to have the cardiac monitor at this time.

## 2013-11-29 ENCOUNTER — Ambulatory Visit (INDEPENDENT_AMBULATORY_CARE_PROVIDER_SITE_OTHER): Payer: BC Managed Care – PPO | Admitting: Family Medicine

## 2013-11-29 ENCOUNTER — Encounter: Payer: Self-pay | Admitting: Family Medicine

## 2013-11-29 ENCOUNTER — Encounter (INDEPENDENT_AMBULATORY_CARE_PROVIDER_SITE_OTHER): Payer: Self-pay

## 2013-11-29 VITALS — BP 108/72 | HR 89 | Temp 98.0°F | Ht 73.0 in | Wt 306.2 lb

## 2013-11-29 DIAGNOSIS — Z86711 Personal history of pulmonary embolism: Secondary | ICD-10-CM

## 2013-11-29 DIAGNOSIS — E119 Type 2 diabetes mellitus without complications: Secondary | ICD-10-CM

## 2013-11-29 DIAGNOSIS — G8929 Other chronic pain: Secondary | ICD-10-CM

## 2013-11-29 DIAGNOSIS — I2699 Other pulmonary embolism without acute cor pulmonale: Secondary | ICD-10-CM

## 2013-11-29 DIAGNOSIS — I10 Essential (primary) hypertension: Secondary | ICD-10-CM

## 2013-11-29 DIAGNOSIS — M549 Dorsalgia, unspecified: Secondary | ICD-10-CM

## 2013-11-29 LAB — POCT INR: INR: 2.1

## 2013-11-29 MED ORDER — WARFARIN SODIUM 4 MG PO TABS: 4.0000 mg | ORAL_TABLET | Freq: Every day | ORAL | Status: DC

## 2013-11-29 MED ORDER — WARFARIN SODIUM 1 MG PO TABS: 2.0000 mg | ORAL_TABLET | ORAL | Status: DC

## 2013-11-29 MED ORDER — LISINOPRIL 10 MG PO TABS: 10.0000 mg | ORAL_TABLET | Freq: Every day | ORAL | Status: DC

## 2013-11-29 MED ORDER — GLIMEPIRIDE 4 MG PO TABS: 4.0000 mg | ORAL_TABLET | Freq: Every day | ORAL | Status: DC

## 2013-11-29 MED ORDER — HYDROCODONE-ACETAMINOPHEN 5-325 MG PO TABS: 1.0000 | ORAL_TABLET | Freq: Every day | ORAL | Status: DC | PRN

## 2013-11-29 NOTE — Patient Instructions (Addendum)
Let me know where you want to go for the follow up colonoscopy.   Try to get me the name of the previous GI clinic so we can request your records.   See about getting the records on the previous pneumonia shot.   Recheck in about 6 months, labs ahead of time.   Take care.  Glad to see you.

## 2013-11-29 NOTE — Progress Notes (Signed)
Pre visit review using our clinic review tool, if applicable. No additional management support is needed unless otherwise documented below in the visit note.  Hypertension:    Using medication without problems or lightheadedness: yes Chest pain with exertion:no Edema:no Short of breath:no  Diabetes:  Using medications without difficulties: yes Hypoglycemic episodes: no Hyperglycemic episodes: no Feet problems: arthritis at baseline, no tingling.  Blood Sugars averaging: ~150 recently.  eye exam within last year: due for fu this fall.  A1c done at prev MD office recently.  Requesting records.  Weight d/w pt.  Diet and exercise d/w pt.  Cut out soda for 4 weeks. More walking. Not weight changes yet.  He is working on it.  He had lost up to 60 lbs prev.   He is aware of diabetic diet.  He cut out bread and is packing his lunch.    H/o PE.  No bleeding, bruising.  Compliant with meds.  Due for INR.   Back pain and foot pain. Both chronic.  Rare med use overall.  A few hydrocodone a month, with relief, work cautions given.    PMH and SH reviewed  Meds, vitals, and allergies reviewed.   ROS: See HPI.  Otherwise negative.    GEN: nad, alert and oriented HEENT: mucous membranes moist NECK: supple w/o LA CV: rrr. PULM: ctab, no inc wob ABD: soft, +bs EXT: no edema SKIN: no acute rash

## 2013-12-01 ENCOUNTER — Telehealth: Payer: Self-pay | Admitting: Family Medicine

## 2013-12-01 ENCOUNTER — Encounter: Payer: Self-pay | Admitting: Family Medicine

## 2013-12-01 DIAGNOSIS — I10 Essential (primary) hypertension: Secondary | ICD-10-CM | POA: Insufficient documentation

## 2013-12-01 DIAGNOSIS — Z86711 Personal history of pulmonary embolism: Secondary | ICD-10-CM | POA: Insufficient documentation

## 2013-12-01 DIAGNOSIS — Z1211 Encounter for screening for malignant neoplasm of colon: Secondary | ICD-10-CM

## 2013-12-01 DIAGNOSIS — E119 Type 2 diabetes mellitus without complications: Secondary | ICD-10-CM | POA: Insufficient documentation

## 2013-12-01 DIAGNOSIS — E11649 Type 2 diabetes mellitus with hypoglycemia without coma: Secondary | ICD-10-CM | POA: Insufficient documentation

## 2013-12-01 DIAGNOSIS — G8929 Other chronic pain: Secondary | ICD-10-CM | POA: Insufficient documentation

## 2013-12-01 DIAGNOSIS — M549 Dorsalgia, unspecified: Secondary | ICD-10-CM | POA: Insufficient documentation

## 2013-12-01 NOTE — Assessment & Plan Note (Signed)
Continue prn hydrocodone, taken rarely.  No ADE on meds.

## 2013-12-01 NOTE — Telephone Encounter (Signed)
Referral for GI placed.  Rosaria Ferries- see the hard copy before calling/making appointment.  Thanks.

## 2013-12-01 NOTE — Assessment & Plan Note (Addendum)
D/w pt about weight and diet.  Requesting records.  Okay for outpatient f/u.  >30 minutes spent in face to face time with patient, >50% spent in counselling or coordination of care.

## 2013-12-01 NOTE — Assessment & Plan Note (Signed)
Controlled, continue current meds.   

## 2013-12-01 NOTE — Assessment & Plan Note (Signed)
INR at goal, requesting records.

## 2013-12-02 ENCOUNTER — Encounter: Payer: Self-pay | Admitting: Family Medicine

## 2014-01-07 DIAGNOSIS — Z86718 Personal history of other venous thrombosis and embolism: Secondary | ICD-10-CM | POA: Insufficient documentation

## 2014-01-07 DIAGNOSIS — K635 Polyp of colon: Secondary | ICD-10-CM | POA: Insufficient documentation

## 2014-01-10 ENCOUNTER — Ambulatory Visit: Payer: BC Managed Care – PPO

## 2014-01-11 ENCOUNTER — Telehealth: Payer: Self-pay | Admitting: Family Medicine

## 2014-01-11 NOTE — Telephone Encounter (Signed)
Patient did not come for their scheduled appointment 01/10/14 pt/inr  Please let me know if the patient needs to be contacted immediately for follow up or if no follow up is necessary.

## 2014-01-11 NOTE — Telephone Encounter (Signed)
Need INR f/u.  Thanks.

## 2014-01-12 NOTE — Telephone Encounter (Signed)
Pt called to r/s appointment 01/13/14 @ 8:30 Pt aware

## 2014-01-13 ENCOUNTER — Ambulatory Visit (INDEPENDENT_AMBULATORY_CARE_PROVIDER_SITE_OTHER): Payer: BC Managed Care – PPO | Admitting: Family Medicine

## 2014-01-13 DIAGNOSIS — I2699 Other pulmonary embolism without acute cor pulmonale: Secondary | ICD-10-CM

## 2014-01-13 LAB — POCT INR: INR: 1.9

## 2014-02-07 ENCOUNTER — Ambulatory Visit (INDEPENDENT_AMBULATORY_CARE_PROVIDER_SITE_OTHER): Payer: BC Managed Care – PPO | Admitting: Family Medicine

## 2014-02-07 ENCOUNTER — Encounter: Payer: Self-pay | Admitting: Family Medicine

## 2014-02-07 VITALS — BP 120/70 | HR 60 | Temp 98.1°F | Wt 311.5 lb

## 2014-02-07 DIAGNOSIS — E119 Type 2 diabetes mellitus without complications: Secondary | ICD-10-CM

## 2014-02-07 DIAGNOSIS — Z86711 Personal history of pulmonary embolism: Secondary | ICD-10-CM

## 2014-02-07 NOTE — Patient Instructions (Addendum)
Get a diabetic eye exam.  Check to see that you got the pneumonia shot at work last year.  See what version of the vaccine you had previously gotten.   Thanks for getting a flu shot.   Go to the lab on the way out.  We'll contact you with your lab report. Try to gradually lose weight, 1-2 lbs a month.  Take care. Glad to see you.  I'll see you in 2016, sooner if needed.

## 2014-02-07 NOTE — Progress Notes (Signed)
Pre visit review using our clinic review tool, if applicable. No additional management support is needed unless otherwise documented below in the visit note.  Diabetes:  Using medications without difficulties: yes Hypoglycemic episodes: no Hyperglycemic episodes: no Feet problems: no Blood Sugars averaging: ~150, steady usually eye exam within last year: due, d/w pt.   He had gotten a PNA vaccine at work prev, he'll check on which one he got for our record keeping.  D/w pt.   Pending for colonoscopy.  Asking about lovenox bridge vs other oral meds in the meantime.  I told him I would look into this.  H/o PE noted.   Meds, vitals, and allergies reviewed.   ROS: See HPI.  Otherwise negative.    GEN: nad, alert and oriented HEENT: mucous membranes moist NECK: supple w/o LA CV: rrr. PULM: ctab, no inc wob ABD: soft, +bs EXT: no edema SKIN: no acute rash  Diabetic foot exam: Normal inspection No skin breakdown No calluses  Normal DP pulses Normal sensation to light touch and monofilament Nails normal

## 2014-02-08 LAB — LDL CHOLESTEROL, DIRECT: Direct LDL: 117.8 mg/dL

## 2014-02-08 LAB — HEMOGLOBIN A1C: HEMOGLOBIN A1C: 7.5 % — AB (ref 4.6–6.5)

## 2014-02-09 MED ORDER — RIVAROXABAN 20 MG PO TABS: 20.0000 mg | ORAL_TABLET | Freq: Every day | ORAL | Status: DC

## 2014-02-09 NOTE — Assessment & Plan Note (Signed)
D/w pt about gradual weight loss.

## 2014-02-09 NOTE — Assessment & Plan Note (Signed)
See notes on labs.  D/w pt about diet and exercise, gradual weight loss.  No change in DM2 meds.

## 2014-02-09 NOTE — Addendum Note (Signed)
Addended by: Tonia Ghent on: 02/09/2014 11:33 AM   Modules accepted: Level of Service

## 2014-02-09 NOTE — Assessment & Plan Note (Signed)
He would like to get off coumadin.  We can change him to xarelto 20mg  a day, but he can't start it until his INR is <3.  I would try to get the rx filled in the meantime, but don't start it yet.  Note that we may need to get a PA done.   When he has changed over to xarelto, he can notify GI about scheduling a colonoscopy.  He may be able to avoid a lovenox bridge if on xarelto.   >25 minutes spent in face to face time with patient, >50% spent in counselling or coordination of care.

## 2014-02-28 ENCOUNTER — Ambulatory Visit: Payer: BC Managed Care – PPO

## 2014-02-28 ENCOUNTER — Other Ambulatory Visit (INDEPENDENT_AMBULATORY_CARE_PROVIDER_SITE_OTHER): Payer: BC Managed Care – PPO

## 2014-02-28 ENCOUNTER — Telehealth: Payer: Self-pay | Admitting: *Deleted

## 2014-02-28 DIAGNOSIS — I493 Ventricular premature depolarization: Secondary | ICD-10-CM

## 2014-02-28 LAB — PROTIME-INR
INR: 2.9 ratio — ABNORMAL HIGH (ref 0.8–1.0)
Prothrombin Time: 31.2 s — ABNORMAL HIGH (ref 9.6–13.1)

## 2014-02-28 NOTE — Telephone Encounter (Signed)
Yes, we sent rx for xarelto to [facility] [address] [place], [place].  He is to try to get that filled in the meantime but not start it yet.  We may need to get a PA done.  Maudie Mercury talked to him about it on 02/11/14.  He'll need to notify us when he has xarelto filled.  We can't do much until he gets his xarelto rx filled.  Also, his INR was 2.9, okay for now. Continue current coumadin done.  Plan on recheck INR in 4 weeks.  Thanks.

## 2014-02-28 NOTE — Telephone Encounter (Signed)
He should be on coumadin currently, not on lovenox.  I didn't rx lovenox.  Did he get the xarelto filled yet? Let me know and we'll make plans for him.  Thanks.

## 2014-02-28 NOTE — Addendum Note (Signed)
Addended by: Marchia Bond on: 02/28/2014 08:44 AM   Modules accepted: Orders

## 2014-02-28 NOTE — Telephone Encounter (Signed)
Pt was in this am for inr check, he wanted to know when he could switch blood thinner and d/c lovenox?

## 2014-02-28 NOTE — Telephone Encounter (Signed)
Patient stated that he is on coumadin now, but is going to have a colonoscopy the first of the year. Patient stated that he has to be off of the coumadin for the procedure and will have to take Lovenox injections. Patient stated that the Lovenox is very expensive and he really prefers not to take those shots. Patient stated that he is going to need a refill on coumadin soon and does not want to spend the money for that if you are going to change it to something else. Patient stated that the last time he talked with Dr. Damita Dunnings they had talked about changing his coumadin to something else.

## 2014-03-01 NOTE — Telephone Encounter (Signed)
Left message on patient's voicemail to return call

## 2014-03-02 NOTE — Telephone Encounter (Signed)
Left message on voice mail  to call back

## 2014-03-02 NOTE — Telephone Encounter (Signed)
Pt left v/m returning call and requesting cb; did not leave contact #.

## 2014-03-03 NOTE — Telephone Encounter (Signed)
Patient notified as instructed by telephone and pt voiced understanding.

## 2014-03-18 ENCOUNTER — Ambulatory Visit (INDEPENDENT_AMBULATORY_CARE_PROVIDER_SITE_OTHER): Payer: BC Managed Care – PPO | Admitting: Family Medicine

## 2014-03-18 ENCOUNTER — Encounter: Payer: Self-pay | Admitting: Family Medicine

## 2014-03-18 VITALS — BP 124/84 | HR 64 | Temp 98.4°F | Wt 311.0 lb

## 2014-03-18 DIAGNOSIS — Z86711 Personal history of pulmonary embolism: Secondary | ICD-10-CM

## 2014-03-18 DIAGNOSIS — Z7901 Long term (current) use of anticoagulants: Secondary | ICD-10-CM

## 2014-03-18 LAB — PROTIME-INR
INR: 2.8 ratio — AB (ref 0.8–1.0)
PROTHROMBIN TIME: 29.7 s — AB (ref 9.6–13.1)

## 2014-03-18 NOTE — Patient Instructions (Signed)
Go to the lab on the way out.  We'll contact you with your lab report. If you INR is below 3, then we'll tell you stop the coumadin and start the xarelto.   Don't change before we talk.  We'll notify [facility] in the meantime.  Take care.

## 2014-03-18 NOTE — Progress Notes (Signed)
Pre visit review using our clinic review tool, if applicable. No additional management support is needed unless otherwise documented below in the visit note.  Here to discuss plans for long term anticoagulation.  See plan.  Father noted to have h/o of a blood clot.  On coumadin, wants to change to xarelto with colonoscopy upcoming with [facility].  No bleeding.  Not on xarelto yet.  See INR.   Meds, vitals, and allergies reviewed.   ROS: See HPI.  Otherwise, noncontributory.  nad ncat Mmm rrr ctab Abd soft, not ttp Ext w/o edema

## 2014-03-21 ENCOUNTER — Other Ambulatory Visit: Payer: Self-pay | Admitting: Family Medicine

## 2014-03-22 ENCOUNTER — Encounter: Payer: Self-pay | Admitting: Family Medicine

## 2014-03-22 ENCOUNTER — Telehealth: Payer: Self-pay | Admitting: Family Medicine

## 2014-03-22 NOTE — Telephone Encounter (Signed)
Paged Dr. Vira Agar.

## 2014-03-22 NOTE — Telephone Encounter (Signed)
Patient has an appointment with [facility] about a colonoscopy.  They should be able to get the last OV note off the EMR.  I'd like to talk to them in the meantime.  Please see if you can get them on the phone.  Thanks.

## 2014-03-22 NOTE — Assessment & Plan Note (Signed)
With his FH, I would continue anticoagulation.  On coumadin, wants to change to xarelto with colonoscopy upcoming with [facility].  No bleeding.  Not on xarelto yet.  See INR.  Addendum, will stop coumadin and change to xarelto.   Will notify GI.

## 2014-03-23 NOTE — Telephone Encounter (Signed)
I talked with Dr. Tamala Julian about the change to [facility] and he is aware, grateful for the call.  He should be able to get notes via epic.  I thanked him for the call.

## 2014-05-04 LAB — HM COLONOSCOPY

## 2014-05-19 ENCOUNTER — Encounter: Payer: Self-pay | Admitting: Family Medicine

## 2014-05-22 ENCOUNTER — Other Ambulatory Visit: Payer: Self-pay | Admitting: Family Medicine

## 2014-05-22 DIAGNOSIS — E119 Type 2 diabetes mellitus without complications: Secondary | ICD-10-CM

## 2014-05-26 ENCOUNTER — Other Ambulatory Visit (INDEPENDENT_AMBULATORY_CARE_PROVIDER_SITE_OTHER): Payer: BC Managed Care – PPO

## 2014-05-26 DIAGNOSIS — E119 Type 2 diabetes mellitus without complications: Secondary | ICD-10-CM

## 2014-05-26 LAB — HEMOGLOBIN A1C: Hgb A1c MFr Bld: 10.8 % — ABNORMAL HIGH (ref 4.6–6.5)

## 2014-06-02 ENCOUNTER — Encounter: Payer: Self-pay | Admitting: Family Medicine

## 2014-06-02 ENCOUNTER — Ambulatory Visit (INDEPENDENT_AMBULATORY_CARE_PROVIDER_SITE_OTHER): Payer: BC Managed Care – PPO | Admitting: Family Medicine

## 2014-06-02 VITALS — BP 120/80 | HR 73 | Temp 97.8°F | Wt 312.5 lb

## 2014-06-02 DIAGNOSIS — E119 Type 2 diabetes mellitus without complications: Secondary | ICD-10-CM

## 2014-06-02 MED ORDER — SITAGLIPTIN PHOSPHATE 100 MG PO TABS: 100.0000 mg | ORAL_TABLET | Freq: Every day | ORAL | Status: DC

## 2014-06-02 MED ORDER — GLIMEPIRIDE 4 MG PO TABS: 8.0000 mg | ORAL_TABLET | Freq: Every day | ORAL | Status: DC

## 2014-06-02 NOTE — Progress Notes (Signed)
Pre visit review using our clinic review tool, if applicable. No additional management support is needed unless otherwise documented below in the visit note.  DM2 f/u.  Prior to holidays sugar was better controlled.  Diet was totally off over the holidays.  A1c 7.5-->10.8.  Recently sugar 240 fasting.  Still on amaryl.  Intolerant of metformin.  Labs d/w pt.  No foot tingling.    Meds, vitals, and allergies reviewed.   ROS: See HPI.  Otherwise, noncontributory.  GEN: nad, alert and oriented HEENT: mucous membranes moist NECK: supple w/o LA CV: rrr. PULM: ctab, no inc wob ABD: soft, +bs EXT: no edema

## 2014-06-02 NOTE — Assessment & Plan Note (Signed)
A1c up, d/w pt.  Diet was way off.  Given diet handout, discussed.  He'll inc amaryl to 8mg  a day, try to add on januvia, work on diet, update me next week.  He'll check on insulin use with his job requirements.  He'll update me next week.  Recheck 3 months, sooner prn.  He had some recently nasal congestion, d/w pt about nasal saline and plain OTC claritin prn- nasal exam stuffy but sinuses not ttp, normal TMs and OP wnl, no fevers.

## 2014-06-02 NOTE — Patient Instructions (Signed)
Increase the glimepiride to 8mg  a day (2 pills in the AM). Use the eat right diet.  Price check the [place] and start that if at all possible.  Check on insulin restrictions at work.  Update me on your sugar in about 1 week.  Recheck in 3 months, but I want to hear from you in the meantime.

## 2014-06-09 ENCOUNTER — Telehealth: Payer: Self-pay

## 2014-06-09 NOTE — Telephone Encounter (Signed)
Left message on patient's voicemail to return call

## 2014-06-09 NOTE — Telephone Encounter (Signed)
Give it another week with the meds as is, check on insulin at work, and let me know next week about the work regulations and his sugars at that point.  Thanks.

## 2014-06-09 NOTE — Telephone Encounter (Signed)
Pt said his FBS is averaging 200-215 and after eating BS is running around 200 as well. Pt is trying to follow diet and pt taking glimepiride 8 mg daily; pt started Januvia 100 mg one daily last week. Pt has not checked with work about insulin restrictions at work. [place]

## 2014-06-13 ENCOUNTER — Telehealth: Payer: Self-pay

## 2014-06-13 NOTE — Telephone Encounter (Signed)
Tim Hodge from dental office Dr Geoffry Paradise called to verify instructions given earlier;Carrie spoke with Dr Damita Dunnings earlier and was advised pt should not have tooth pulled today; appt rescheduled on 06/20/14 and pt can stop Xarelto on 06/15/14 until has dental appt. I called Dr Elna Breslow office back and spoke with Roselyn Reef and verified pt needs to stop Xarelto 5 days prior to dental procedure. Roselyn Reef voiced understanding. Will send to Dr Damita Dunnings for confirmation.

## 2014-06-14 NOTE — Telephone Encounter (Signed)
Patient says his BS's have dropped now to much better levels.  He states he will not be able to do the insulin at work.  Patient states that from what he has discussed with them, it could cost him his job.

## 2014-06-14 NOTE — Telephone Encounter (Signed)
Agreed, thanks

## 2014-06-15 NOTE — Telephone Encounter (Signed)
Left message on voice mail  to call back

## 2014-06-15 NOTE — Telephone Encounter (Signed)
Have him update me on pre/2 hour post-meal sugars, continue on DM2 diet and work on weight loss.  Keep the May appointment as scheduled.  Thanks.

## 2014-06-17 NOTE — Telephone Encounter (Signed)
Wife advised and she will have him get the BS readings and call some in prior to his May appt.

## 2014-09-02 ENCOUNTER — Encounter: Payer: Self-pay | Admitting: Family Medicine

## 2014-09-02 ENCOUNTER — Ambulatory Visit (INDEPENDENT_AMBULATORY_CARE_PROVIDER_SITE_OTHER): Payer: BC Managed Care – PPO | Admitting: Family Medicine

## 2014-09-02 VITALS — BP 114/60 | HR 72 | Temp 98.5°F | Wt 303.0 lb

## 2014-09-02 DIAGNOSIS — E119 Type 2 diabetes mellitus without complications: Secondary | ICD-10-CM | POA: Diagnosis not present

## 2014-09-02 LAB — HEMOGLOBIN A1C
HEMOGLOBIN A1C: 6 % — AB (ref <5.7)
Mean Plasma Glucose: 126 mg/dL — ABNORMAL HIGH (ref <117)

## 2014-09-02 NOTE — Patient Instructions (Signed)
Go to the lab on the way out.  We'll contact you with your lab report. Take care.  Glad to see you.   We'll see about when to come back when I your labs.

## 2014-09-02 NOTE — Progress Notes (Signed)
Pre visit review using our clinic review tool, if applicable. No additional management support is needed unless otherwise documented below in the visit note.  Tolerating xarelto w/o bleeding.    Diabetes:  Using medications without difficulties: yes Hypoglycemic episodes:no Hyperglycemic episodes:no Feet problems:no, other than walking on concrete at work.  Blood Sugars averaging: much improved per patient.  Recently ~160 in AM.  2 hours after eating ~145.  All way better than prev.   eye exam within last year: due, d/w pt.   Due for labs.   Down 9.5 lbs.  He has been working on weight, diet.  He has noted his pants fit looser.   He is using eat right diet.    Meds, vitals, and allergies reviewed.   ROS: See HPI.  Otherwise, noncontributory.  GEN: nad, alert and oriented HEENT: mucous membranes moist NECK: supple w/o LA CV: rrr.  no murmur PULM: ctab, no inc wob ABD: soft, +bs EXT: no edema SKIN: no acute rash

## 2014-09-04 NOTE — Assessment & Plan Note (Signed)
A1c much improved,  See notes on labs.   I thanked patient for his effort.   Continue as is for now.  Recheck labs in about 6 months.   He'll update me if he has troubles in the meantime.

## 2014-09-06 ENCOUNTER — Telehealth: Payer: Self-pay | Admitting: Family Medicine

## 2014-09-06 NOTE — Telephone Encounter (Signed)
Pt returned your call.  

## 2014-09-06 NOTE — Telephone Encounter (Signed)
Patient advised of most recent lab work and says he will call for the 6 mos FU appt with labs prior when he is not at work and can write it down.

## 2014-10-04 ENCOUNTER — Telehealth: Payer: Self-pay | Admitting: Family Medicine

## 2014-10-04 NOTE — Telephone Encounter (Signed)
Pt came in requesting a whooping cough shot this Friday anytime after 3:30pm. His daughter is having a baby and he needs one before he can be around them. He can be contacted at [phone].

## 2014-10-05 NOTE — Telephone Encounter (Signed)
Okay to schedule for Tdap.  Congrats on the pending birth.  Thanks.

## 2014-10-06 NOTE — Telephone Encounter (Signed)
Pt left v/m returning missed call and request cb.

## 2014-10-07 ENCOUNTER — Ambulatory Visit (INDEPENDENT_AMBULATORY_CARE_PROVIDER_SITE_OTHER): Payer: BC Managed Care – PPO

## 2014-10-07 DIAGNOSIS — Z23 Encounter for immunization: Secondary | ICD-10-CM | POA: Diagnosis not present

## 2014-10-11 ENCOUNTER — Other Ambulatory Visit: Payer: Self-pay

## 2014-10-11 NOTE — Telephone Encounter (Signed)
Pt said [facility] [address] requesting cb about how many tabs pt is supposed to be taking daily; pt said the last rx filled was one daily but pt has been taking 2 tabs at breakfast per instruction 06/02/14. Spoke with Micki at [facility] [employer] and she will d/c old rx refills available with instructions 1 tab at breakfast. Micki found 06/02/14 rx on hold and will fill for pt. Pt notified and will ck with pharmacy later today for pick up.

## 2014-11-30 ENCOUNTER — Other Ambulatory Visit: Payer: Self-pay | Admitting: Family Medicine

## 2014-11-30 DIAGNOSIS — E119 Type 2 diabetes mellitus without complications: Secondary | ICD-10-CM

## 2014-12-06 ENCOUNTER — Other Ambulatory Visit (INDEPENDENT_AMBULATORY_CARE_PROVIDER_SITE_OTHER): Payer: BC Managed Care – PPO

## 2014-12-06 DIAGNOSIS — E119 Type 2 diabetes mellitus without complications: Secondary | ICD-10-CM

## 2014-12-06 LAB — COMPREHENSIVE METABOLIC PANEL
ALBUMIN: 3.3 g/dL — AB (ref 3.5–5.2)
ALT: 43 U/L (ref 0–53)
AST: 45 U/L — AB (ref 0–37)
Alkaline Phosphatase: 104 U/L (ref 39–117)
BUN: 16 mg/dL (ref 6–23)
CO2: 28 meq/L (ref 19–32)
CREATININE: 1.03 mg/dL (ref 0.40–1.50)
Calcium: 8.9 mg/dL (ref 8.4–10.5)
Chloride: 105 mEq/L (ref 96–112)
GFR: 78.69 mL/min (ref >60.00)
GLUCOSE: 146 mg/dL — AB (ref 70–99)
POTASSIUM: 4 meq/L (ref 3.5–5.1)
SODIUM: 138 meq/L (ref 135–145)
Total Bilirubin: 0.6 mg/dL (ref 0.2–1.2)
Total Protein: 6.6 g/dL (ref 6.0–8.3)

## 2014-12-06 LAB — LIPID PANEL
CHOL/HDL RATIO: 6
Cholesterol: 146 mg/dL (ref 0–200)
HDL: 26 mg/dL — ABNORMAL LOW (ref >39.00)
LDL Cholesterol: 81 mg/dL (ref 0–99)
NONHDL: 120.18
Triglycerides: 194 mg/dL — ABNORMAL HIGH (ref 0.0–149.0)
VLDL: 38.8 mg/dL (ref 0.0–40.0)

## 2014-12-06 LAB — HEMOGLOBIN A1C: HEMOGLOBIN A1C: 6.1 % (ref 4.6–6.5)

## 2014-12-08 ENCOUNTER — Encounter: Payer: Self-pay | Admitting: *Deleted

## 2014-12-18 ENCOUNTER — Other Ambulatory Visit: Payer: Self-pay | Admitting: Family Medicine

## 2015-03-03 ENCOUNTER — Other Ambulatory Visit: Payer: Self-pay | Admitting: *Deleted

## 2015-03-03 NOTE — Telephone Encounter (Signed)
Ok to fill 

## 2015-03-05 MED ORDER — RIVAROXABAN 20 MG PO TABS: 20.0000 mg | ORAL_TABLET | Freq: Every day | ORAL | Status: DC

## 2015-03-05 NOTE — Telephone Encounter (Signed)
Sent. Thanks.   

## 2015-03-06 ENCOUNTER — Other Ambulatory Visit: Payer: Self-pay | Admitting: Family Medicine

## 2015-03-06 ENCOUNTER — Ambulatory Visit (INDEPENDENT_AMBULATORY_CARE_PROVIDER_SITE_OTHER): Payer: BC Managed Care – PPO | Admitting: Family Medicine

## 2015-03-06 ENCOUNTER — Encounter: Payer: Self-pay | Admitting: Family Medicine

## 2015-03-06 VITALS — BP 110/70 | HR 104 | Temp 98.4°F | Wt 297.5 lb

## 2015-03-06 DIAGNOSIS — Z119 Encounter for screening for infectious and parasitic diseases, unspecified: Secondary | ICD-10-CM | POA: Diagnosis not present

## 2015-03-06 DIAGNOSIS — E119 Type 2 diabetes mellitus without complications: Secondary | ICD-10-CM

## 2015-03-06 DIAGNOSIS — Z23 Encounter for immunization: Secondary | ICD-10-CM | POA: Diagnosis not present

## 2015-03-06 NOTE — Progress Notes (Signed)
Pre visit review using our clinic review tool, if applicable. No additional management support is needed unless otherwise documented below in the visit note.  Diabetes:  Using medications without difficulties:yes Hypoglycemic episodes:no Hyperglycemic episodes:no Feet problems:not from DM2 but has pain at baseline from prev OA Blood Sugars averaging: 90-160s eye exam within last year: due, d/w pt.   Meds, vitals, and allergies reviewed.   ROS: See HPI.  Otherwise negative.    GEN: nad, alert and oriented HEENT: mucous membranes moist NECK: supple w/o LA CV: rrr. PULM: ctab, no inc wob EXT: no edema  Diabetic foot exam: Normal inspection No skin breakdown No calluses  Normal DP pulses Normal sensation to light touch and monofilament Nails normal

## 2015-03-06 NOTE — Patient Instructions (Signed)
Go to the lab on the way out.  We'll contact you with your lab report. Call about an eye exam.  Take care.  Glad to see you.  Plan on a recheck in about 4 months

## 2015-03-07 LAB — HEPATITIS C ANTIBODY: HCV Ab: NEGATIVE

## 2015-03-07 LAB — HIV ANTIBODY (ROUTINE TESTING W REFLEX): HIV: NONREACTIVE

## 2015-03-07 LAB — HEMOGLOBIN A1C: Hgb A1c MFr Bld: 6.2 % (ref 4.6–6.5)

## 2015-03-07 NOTE — Assessment & Plan Note (Signed)
D/w pt about diet and exercise, A1c pending.  No change in meds at this point. Compliant with meds.  See notes on labs.   Pt opts in for HCV and HIV screening.  D/w pt re: routine screening.

## 2015-03-10 ENCOUNTER — Encounter: Payer: Self-pay | Admitting: *Deleted

## 2015-04-19 ENCOUNTER — Other Ambulatory Visit: Payer: Self-pay

## 2015-04-19 MED ORDER — HYDROCODONE-ACETAMINOPHEN 5-325 MG PO TABS: 1.0000 | ORAL_TABLET | Freq: Every day | ORAL | Status: DC | PRN

## 2015-04-19 NOTE — Telephone Encounter (Signed)
Pt left v/m requesting rx hydrocodone apap due to foot pain after going hunting. Call when ready for pick up. rx last printed # 30 on 11/29/2013. Last f/u on 03/06/15.

## 2015-04-19 NOTE — Telephone Encounter (Signed)
Patient advised.  Rx left at front desk for pick up. 

## 2015-04-19 NOTE — Telephone Encounter (Signed)
Printed.  Thanks.  

## 2015-06-03 ENCOUNTER — Other Ambulatory Visit: Payer: Self-pay | Admitting: Family Medicine

## 2015-07-04 ENCOUNTER — Encounter: Payer: Self-pay | Admitting: Family Medicine

## 2015-07-04 ENCOUNTER — Ambulatory Visit (INDEPENDENT_AMBULATORY_CARE_PROVIDER_SITE_OTHER): Payer: BC Managed Care – PPO | Admitting: Family Medicine

## 2015-07-04 VITALS — BP 126/80 | HR 76 | Temp 97.8°F | Wt 299.5 lb

## 2015-07-04 DIAGNOSIS — E119 Type 2 diabetes mellitus without complications: Secondary | ICD-10-CM

## 2015-07-04 LAB — HEMOGLOBIN A1C: HEMOGLOBIN A1C: 7.3 % — AB (ref 4.6–6.5)

## 2015-07-04 MED ORDER — SITAGLIPTIN PHOSPHATE 100 MG PO TABS: 100.0000 mg | ORAL_TABLET | Freq: Every day | ORAL | Status: DC

## 2015-07-04 MED ORDER — GLIMEPIRIDE 4 MG PO TABS: 8.0000 mg | ORAL_TABLET | Freq: Every day | ORAL | Status: DC

## 2015-07-04 MED ORDER — LISINOPRIL 10 MG PO TABS: 10.0000 mg | ORAL_TABLET | Freq: Every day | ORAL | Status: DC

## 2015-07-04 NOTE — Patient Instructions (Addendum)
Call about an eye exam.  Go to the lab on the way out.  We'll contact you with your lab report. Plan on a physical in about 4-5 months, labs ahead of time if possible.  Take care.  Glad to see you.

## 2015-07-04 NOTE — Assessment & Plan Note (Signed)
overall improved historically.  D/w pt.  A1c pending.   Continue meds as is.   Continue work on diet and exercise.   Recheck labs in about 4-5 months at CPE.  He agrees.

## 2015-07-04 NOTE — Progress Notes (Signed)
Pre visit review using our clinic review tool, if applicable. No additional management support is needed unless otherwise documented below in the visit note.  Diabetes:  Using medications without difficulties: yes Hypoglycemic episodes: not happening often at all- rare- only if not eating on a schedule.  D/w pt.  Hyperglycemic episodes: no  Feet problems: not  Blood Sugars averaging: usually ~150 in the AM eye exam within last year: due, d/w pt.  He'll call for appointment.    Still using prn hydrocodone for foot pain. No ADE on med. Rarely used, minimal use since the fall.    Meds, vitals, and allergies reviewed.   ROS: See HPI.  Otherwise negative.    GEN: nad, alert and oriented HEENT: mucous membranes moist NECK: supple w/o LA CV: rrr. PULM: ctab, no inc wob ABD: soft, +bs EXT: no edema

## 2015-09-04 ENCOUNTER — Telehealth: Payer: Self-pay

## 2015-09-04 NOTE — Telephone Encounter (Signed)
[facility] office called, wanting reply regarding medication.  Please call [facility]. Please ask to speak with ashley or jamie.  Pt waiting at [facility] for answer.  cb number is [phone] Thank you

## 2015-09-04 NOTE — Telephone Encounter (Signed)
Dr Geoffry Paradise called office and Dr Geoffry Paradise notified as instructed. Dr Geoffry Paradise voiced understanding and call ended.

## 2015-09-04 NOTE — Telephone Encounter (Signed)
Would have patient take last dose two days before procedure (ie, skip the dose on the day before the procedure and also skip the dose on the day of procedure).  Could restart med the day after the procedure.  Thanks.

## 2015-09-04 NOTE — Telephone Encounter (Signed)
Tim Hodge with Dr [facility]ella [facility] dental office left v/m; pt needs tooth extraction and wants to know if pt needs to be off xarelto prior to extraction and if so how long would pt need to be off Xarelto. Tim Hodge request cb ASAP.

## 2015-09-05 ENCOUNTER — Other Ambulatory Visit: Payer: Self-pay | Admitting: *Deleted

## 2015-09-05 NOTE — Telephone Encounter (Signed)
Pt left voicemail at Triage request refill of pain med, last refilled on 04/19/15 #30 with 0 refills

## 2015-09-06 MED ORDER — HYDROCODONE-ACETAMINOPHEN 5-325 MG PO TABS: 1.0000 | ORAL_TABLET | Freq: Every day | ORAL | Status: DC | PRN

## 2015-09-06 NOTE — Telephone Encounter (Signed)
Printed.  Thanks.  

## 2015-09-06 NOTE — Telephone Encounter (Signed)
Patient notified by telephone that script is up front ready for pickup. 

## 2015-12-11 ENCOUNTER — Emergency Department (HOSPITAL_COMMUNITY)
Admission: EM | Admit: 2015-12-11 | Discharge: 2015-12-11 | Disposition: A | Discharge status: Home / Self Care | Payer: Worker's Compensation | Attending: Dermatology | Admitting: Dermatology

## 2015-12-11 ENCOUNTER — Encounter (HOSPITAL_COMMUNITY): Payer: Self-pay | Admitting: Emergency Medicine

## 2015-12-11 ENCOUNTER — Emergency Department (HOSPITAL_COMMUNITY): Payer: Worker's Compensation

## 2015-12-11 DIAGNOSIS — E119 Type 2 diabetes mellitus without complications: Secondary | ICD-10-CM | POA: Insufficient documentation

## 2015-12-11 DIAGNOSIS — Z7901 Long term (current) use of anticoagulants: Secondary | ICD-10-CM | POA: Insufficient documentation

## 2015-12-11 DIAGNOSIS — Z7984 Long term (current) use of oral hypoglycemic drugs: Secondary | ICD-10-CM | POA: Diagnosis not present

## 2015-12-11 DIAGNOSIS — S20219A Contusion of unspecified front wall of thorax, initial encounter: Secondary | ICD-10-CM | POA: Diagnosis not present

## 2015-12-11 DIAGNOSIS — Z79899 Other long term (current) drug therapy: Secondary | ICD-10-CM | POA: Diagnosis not present

## 2015-12-11 DIAGNOSIS — S39012A Strain of muscle, fascia and tendon of lower back, initial encounter: Secondary | ICD-10-CM | POA: Insufficient documentation

## 2015-12-11 DIAGNOSIS — Y990 Civilian activity done for income or pay: Secondary | ICD-10-CM | POA: Insufficient documentation

## 2015-12-11 DIAGNOSIS — S3992XA Unspecified injury of lower back, initial encounter: Secondary | ICD-10-CM | POA: Diagnosis present

## 2015-12-11 DIAGNOSIS — Y929 Unspecified place or not applicable: Secondary | ICD-10-CM | POA: Insufficient documentation

## 2015-12-11 DIAGNOSIS — Y939 Activity, unspecified: Secondary | ICD-10-CM | POA: Diagnosis not present

## 2015-12-11 DIAGNOSIS — Z87891 Personal history of nicotine dependence: Secondary | ICD-10-CM | POA: Diagnosis not present

## 2015-12-11 DIAGNOSIS — I10 Essential (primary) hypertension: Secondary | ICD-10-CM | POA: Insufficient documentation
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
## 2015-12-11 MED ORDER — HYDROCODONE-ACETAMINOPHEN 5-325 MG PO TABS
2.0000 | ORAL_TABLET | Freq: Once | ORAL | Status: AC
  Administered 2015-12-11: 2 via ORAL
  Filled 2015-12-11: qty 2

## 2015-12-11 MED ORDER — HYDROCODONE-ACETAMINOPHEN 5-325 MG PO TABS: 1.0000 | ORAL_TABLET | Freq: Four times a day (QID) | ORAL | 0 refills | Status: DC | PRN

## 2015-12-11 NOTE — Discharge Instructions (Signed)
It was our pleasure to provide your ER care today - we hope that you feel better.  Rest.   You may take hydrocodone as need for pain. No driving for the next 6 hours or when taking hydrocodone. Also, do not take tylenol or acetaminophen containing medication when taking hydrocodone.  Follow up with primary care doctor in 1 week if symptoms fail to improve/resolve.  Return to ER  right away if worse, new symptoms, headaches, vomiting, abdominal pain, trouble breathing, other concern.
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
## 2015-12-11 NOTE — ED Notes (Signed)
Patient returned placed back on monitor by Charlett Nose EMT .

## 2015-12-11 NOTE — ED Notes (Signed)
MD aware RN attempted to get patient out bed. Patient unable to stand.
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
## 2016-01-09 ENCOUNTER — Ambulatory Visit (INDEPENDENT_AMBULATORY_CARE_PROVIDER_SITE_OTHER): Payer: Worker's Compensation | Admitting: Family Medicine

## 2016-01-09 VITALS — BP 130/80 | HR 81 | Temp 98.0°F | Resp 17 | Ht 72.5 in | Wt 301.0 lb

## 2016-01-09 DIAGNOSIS — M5442 Lumbago with sciatica, left side: Secondary | ICD-10-CM

## 2016-01-09 DIAGNOSIS — S20212A Contusion of left front wall of thorax, initial encounter: Secondary | ICD-10-CM | POA: Diagnosis not present

## 2016-01-09 MED ORDER — CYCLOBENZAPRINE HCL 5 MG PO TABS: ORAL_TABLET | ORAL | 0 refills | Status: DC

## 2016-01-09 NOTE — Progress Notes (Signed)
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 padding under your knees for comfort.  Drop your head and point your tail bone toward the ground below you. This will round out your lower back like an angry cat. Hold this position for __________ seconds.  Slowly lift your head and release your tail bone so that your back sags into a large arch, like an old horse.  Hold this position for __________ seconds.  Repeat this until you feel limber in your lower back.  Now, find your "sweet spot." This will be the most comfortable position somewhere between the two previous positions. This is your neutral spine. Once you have found this position, tense your stomach muscles to support your lower back.  Hold this position for __________ seconds. Repeat __________ times. Complete this exercise __________ times per day.  STRENGTHENING EXERCISES -  Low Back Strain These exercises may help you when beginning to rehabilitate your injury. These exercises should be done near your "sweet spot." This is the neutral, low-back arch, somewhere between fully rounded and fully arched, that is your least painful position. When performed in this safe range of motion, these exercises can be used for people who have either a flexion or extension based injury. These exercises may resolve your symptoms with or without further involvement from your physician, physical therapist or athletic trainer. While completing these exercises, remember:   Muscles can gain both the endurance and the strength needed for everyday activities through controlled exercises.  Complete these exercises as instructed by your physician, physical therapist or athletic trainer. Increase the resistance and repetitions only as guided.  You may experience muscle soreness or fatigue, but the pain or discomfort you are trying to eliminate should never worsen during these exercises. If this pain does worsen, stop and make certain you are following the directions exactly. If the pain is still present after adjustments, discontinue the exercise until you can discuss the trouble with your caregiver. STRENGTHENING - Deep Abdominals, Pelvic Tilt  Lie on a firm bed or floor. Keeping your legs in front of you, bend your knees so they are both pointed toward the ceiling and your feet are flat on the floor.  Tense your lower abdominal muscles to press your lower back into the floor. This motion will rotate your pelvis so that your tail bone is scooping upwards rather than pointing at your feet or into the floor.  With a gentle tension and even breathing, hold this position for __________ seconds. Repeat __________ times. Complete this exercise __________ times per day.  STRENGTHENING - Abdominals, Crunches   Lie on a firm bed or floor. Keeping your legs in front of you, bend your knees so they are  both pointed toward the ceiling and your feet are flat on the floor. Cross your arms over your chest.  Slightly tip your chin down without bending your neck.  Tense your abdominals and slowly lift your trunk high enough to just clear your shoulder blades. Lifting higher can put excessive stress on the lower back and does not  further strengthen your abdominal muscles.  Control your return to the starting position. Repeat __________ times. Complete this exercise __________ times per day.  STRENGTHENING - Quadruped, Opposite UE/LE Lift   Assume a hands and knees position on a firm surface. Keep your hands under your shoulders and your knees under your hips. You may place padding under your knees for comfort.  Find your neutral spine and gently tense your abdominal muscles so that you can maintain this position. Your shoulders and hips should form a rectangle that is parallel with the floor and is not twisted.  Keeping your trunk steady, lift your right hand no higher than your shoulder and then your left leg no higher than your hip. Make sure you are not holding your breath. Hold this position __________ seconds.  Continuing to keep your abdominal muscles tense and your back steady, slowly return to your starting position. Repeat with the opposite arm and leg. Repeat __________ times. Complete this exercise __________ times per day.  STRENGTHENING - Lower Abdominals, Double Knee Lift  Lie on a firm bed or floor. Keeping your legs in front of you, bend your knees so they are both pointed toward the ceiling and your feet are flat on the floor.  Tense your abdominal muscles to brace your lower back and slowly lift both of your knees until they come over your hips. Be certain not to hold your breath.  Hold __________ seconds. Using your abdominal muscles, return to the starting position in a slow and controlled manner. Repeat __________ times. Complete this exercise __________ times per day.    POSTURE AND BODY MECHANICS CONSIDERATIONS - Low Back Strain Keeping correct posture when sitting, standing or completing your activities will reduce the stress put on different body tissues, allowing injured tissues a chance to heal and limiting painful experiences. The following are general guidelines for improved posture. Your physician or physical therapist will provide you with any instructions specific to your needs. While reading these guidelines, remember:  The exercises prescribed by your provider will help you have the flexibility and strength to maintain correct postures.  The correct posture provides the best environment for your joints to work. All of your joints have less wear and tear when properly supported by a spine with good posture. This means you will experience a healthier, less painful body.  Correct posture must be practiced with all of your activities, especially prolonged sitting and standing. Correct posture is as important when doing repetitive low-stress activities (typing) as it is when doing a single heavy-load activity (lifting). RESTING POSITIONS Consider which positions are most painful for you when choosing a resting position. If you have pain with flexion-based activities (sitting, bending, stooping, squatting), choose a position that allows you to rest in a less flexed posture. You would want to avoid curling into a fetal position on your side. If your pain worsens with extension-based activities (prolonged standing, working overhead), avoid resting in an extended position such as sleeping on your stomach. Most people will find more comfort when they rest with their spine in a more neutral position, neither too rounded nor too arched. Lying on a non-sagging bed on your side with a pillow between your knees, or on your back with a pillow under your knees will often provide some relief. Keep in mind, being in any one position for a prolonged period of time, no matter how  correct your posture, can still lead to stiffness. PROPER SITTING POSTURE In order to minimize stress  and discomfort on your spine, you must sit with correct posture. Sitting with good posture should be effortless for a healthy body. Returning to good posture is a gradual process. Many people can work toward this most comfortably by using various supports until they have the flexibility and strength to maintain this posture on their own. When sitting with proper posture, your ears will fall over your shoulders and your shoulders will fall over your hips. You should use the back of the chair to support your upper back. Your lower back will be in a neutral position, just slightly arched. You may place a small pillow or folded towel at the base of your lower back for support.  When working at a desk, create an environment that supports good, upright posture. Without extra support, muscles tire, which leads to excessive strain on joints and other tissues. Keep these recommendations in mind: CHAIR:  A chair should be able to slide under your desk when your back makes contact with the back of the chair. This allows you to work closely.  The chair's height should allow your eyes to be level with the upper part of your monitor and your hands to be slightly lower than your elbows. BODY POSITION  Your feet should make contact with the floor. If this is not possible, use a foot rest.  Keep your ears over your shoulders. This will reduce stress on your neck and lower back. INCORRECT SITTING POSTURES  If you are feeling tired and unable to assume a healthy sitting posture, do not slouch or slump. This puts excessive strain on your back tissues, causing more damage and pain. Healthier options include:  Using more support, like a lumbar pillow.  Switching tasks to something that requires you to be upright or walking.  Talking a brief walk.  Lying down to rest in a neutral-spine position. PROLONGED STANDING  WHILE SLIGHTLY LEANING FORWARD  When completing a task that requires you to lean forward while standing in one place for a long time, place either foot up on a stationary 2-4 inch high object to help maintain the best posture. When both feet are on the ground, the lower back tends to lose its slight inward curve. If this curve flattens (or becomes too large), then the back and your other joints will experience too much stress, tire more quickly, and can cause pain. CORRECT STANDING POSTURES Proper standing posture should be assumed with all daily activities, even if they only take a few moments, like when brushing your teeth. As in sitting, your ears should fall over your shoulders and your shoulders should fall over your hips. You should keep a slight tension in your abdominal muscles to brace your spine. Your tailbone should point down to the ground, not behind your body, resulting in an over-extended swayback posture.  INCORRECT STANDING POSTURES  Common incorrect standing postures include a forward head, locked knees and/or an excessive swayback. WALKING Walk with an upright posture. Your ears, shoulders and hips should all line-up. PROLONGED ACTIVITY IN A FLEXED POSITION When completing a task that requires you to bend forward at your waist or lean over a low surface, try to find a way to stabilize 3 out of 4 of your limbs. You can place a hand or elbow on your thigh or rest a knee on the surface you are reaching across. This will provide you more stability so that your muscles do not fatigue as quickly. By keeping your knees relaxed, or slightly bent,  you will also reduce stress across your lower back. CORRECT LIFTING TECHNIQUES DO :   Assume a wide stance. This will provide you more stability and the opportunity to get as close as possible to the object which you are lifting.  Tense your abdominals to brace your spine. Bend at the knees and hips. Keeping your back locked in a neutral-spine  position, lift using your leg muscles. Lift with your legs, keeping your back straight.  Test the weight of unknown objects before attempting to lift them.  Try to keep your elbows locked down at your sides in order get the best strength from your shoulders when carrying an object.  Always ask for help when lifting heavy or awkward objects. INCORRECT LIFTING TECHNIQUES DO NOT:   Lock your knees when lifting, even if it is a small object.  Bend and twist. Pivot at your feet or move your feet when needing to change directions.  Assume that you can safely pick up even a paper clip without proper posture.   This information is not intended to replace advice given to you by your health care provider. Make sure you discuss any questions you have with your health care provider.   Document Released: 04/08/2005 Document Revised: 04/29/2014 Document Reviewed: 07/21/2008 Elsevier Interactive Patient Education 2016 [employer].    IF you received an x-ray today, you will receive an invoice from [facility]. Please contact [facility] at [phone] with questions or concerns regarding your invoice.   IF you received labwork today, you will receive an invoice from [employer]. Please contact [facility] at [phone] with questions or concerns regarding your invoice.   Our billing staff will not be able to assist you with questions regarding bills from these companies.  You will be contacted with the lab results as soon as they are available. The fastest way to get your results is to activate your My Chart account. Instructions are located on the last page of this paperwork. If you have not heard from [place] regarding the results in 2 weeks, please contact this office.        I personally performed the services described in this documentation, which was scribed in my presence. The recorded information has been reviewed and considered, and addended by  me as needed.   Signed,   Merri Ray, MD [facility].  01/09/16 11:21 AM

## 2016-01-09 NOTE — Patient Instructions (Addendum)
I will refer you to orthopedics. In the meantime, gentle range of motion, stretches, Flexeril up to every 8 hours, but this can make you sleepy - so start at bedtime. Tylenol over-the-counter is ok to take with this medication. Heat or ice to affected areas if that improves your symptoms. Make sure you drink plenty of water and a stool softener if those medicines have caused constipation in the past.  Return to the clinic or go to the nearest emergency room if any of your symptoms worsen or new symptoms occur.   Chest Wall Pain Chest wall pain is pain in or around the bones and muscles of your chest. Sometimes, an injury causes this pain. Sometimes, the cause may not be known. This pain may take several weeks or longer to get better. HOME CARE INSTRUCTIONS  Pay attention to any changes in your symptoms. Take these actions to help with your pain:   Rest as told by your health care provider.   Avoid activities that cause pain. These include any activities that use your chest muscles or your abdominal and side muscles to lift heavy items.   If directed, apply ice to the painful area:  Put ice in a plastic bag.  Place a towel between your skin and the bag.  Leave the ice on for 20 minutes, 2-3 times per day.  Take over-the-counter and prescription medicines only as told by your health care provider.  Do not use tobacco products, including cigarettes, chewing tobacco, and e-cigarettes. If you need help quitting, ask your health care provider.  Keep all follow-up visits as told by your health care provider. This is important. SEEK MEDICAL CARE IF:  You have a fever.  Your chest pain becomes worse.  You have new symptoms. SEEK IMMEDIATE MEDICAL CARE IF:  You have nausea or vomiting.  You feel sweaty or light-headed.  You have a cough with phlegm (sputum) or you cough up blood.  You develop shortness of breath.   This information is not intended to replace advice given to you  by your health care provider. Make sure you discuss any questions you have with your health care provider.   Document Released: 04/08/2005 Document Revised: 12/28/2014 Document Reviewed: 07/04/2014 Elsevier Interactive Patient Education 2016 [employer]  Sciatica Sciatica is pain, weakness, numbness, or tingling along the path of the sciatic nerve. The nerve starts in the lower back and runs down the back of each leg. The nerve controls the muscles in the lower leg and in the back of the knee, while also providing sensation to the back of the thigh, lower leg, and the sole of your foot. Sciatica is a symptom of another medical condition. For instance, nerve damage or certain conditions, such as a herniated disk or bone spur on the spine, pinch or put pressure on the sciatic nerve. This causes the pain, weakness, or other sensations normally associated with sciatica. Generally, sciatica only affects one side of the body. CAUSES   Herniated or slipped disc.  Degenerative disk disease.  A pain disorder involving the narrow muscle in the buttocks (piriformis syndrome).  Pelvic injury or fracture.  Pregnancy.  Tumor (rare). SYMPTOMS  Symptoms can vary from mild to very severe. The symptoms usually travel from the low back to the buttocks and down the back of the leg. Symptoms can include:  Mild tingling or dull aches in the lower back, leg, or hip.  Numbness in the back of the calf or sole of the foot.  Burning sensations in the lower back, leg, or hip.  Sharp pains in the lower back, leg, or hip.  Leg weakness.  Severe back pain inhibiting movement. These symptoms may get worse with coughing, sneezing, laughing, or prolonged sitting or standing. Also, being overweight may worsen symptoms. DIAGNOSIS  Your caregiver will perform a physical exam to look for common symptoms of sciatica. He or she may ask you to do certain movements or activities that would trigger sciatic nerve pain.  Other tests may be performed to find the cause of the sciatica. These may include:  Blood tests.  X-rays.  Imaging tests, such as an MRI or CT scan. TREATMENT  Treatment is directed at the cause of the sciatic pain. Sometimes, treatment is not necessary and the pain and discomfort goes away on its own. If treatment is needed, your caregiver may suggest:  Over-the-counter medicines to relieve pain.  Prescription medicines, such as anti-inflammatory medicine, muscle relaxants, or narcotics.  Applying heat or ice to the painful area.  Steroid injections to lessen pain, irritation, and inflammation around the nerve.  Reducing activity during periods of pain.  Exercising and stretching to strengthen your abdomen and improve flexibility of your spine. Your caregiver may suggest losing weight if the extra weight makes the back pain worse.  Physical therapy.  Surgery to eliminate what is pressing or pinching the nerve, such as a bone spur or part of a herniated disk. HOME CARE INSTRUCTIONS   Only take over-the-counter or prescription medicines for pain or discomfort as directed by your caregiver.  Apply ice to the affected area for 20 minutes, 3-4 times a day for the first 48-72 hours. Then try heat in the same way.  Exercise, stretch, or perform your usual activities if these do not aggravate your pain.  Attend physical therapy sessions as directed by your caregiver.  Keep all follow-up appointments as directed by your caregiver.  Do not wear high heels or shoes that do not provide proper support.  Check your mattress to see if it is too soft. A firm mattress may lessen your pain and discomfort. SEEK IMMEDIATE MEDICAL CARE IF:   You lose control of your bowel or bladder (incontinence).  You have increasing weakness in the lower back, pelvis, buttocks, or legs.  You have redness or swelling of your back.  You have a burning sensation when you urinate.  You have pain that  gets worse when you lie down or awakens you at night.  Your pain is worse than you have experienced in the past.  Your pain is lasting longer than 4 weeks.  You are suddenly losing weight without reason. MAKE SURE YOU:  Understand these instructions.  Will watch your condition.  Will get help right away if you are not doing well or get worse.   This information is not intended to replace advice given to you by your health care provider. Make sure you discuss any questions you have with your health care provider.   Document Released: 04/02/2001 Document Revised: 12/28/2014 Document Reviewed: 08/18/2011 Else[employer] Interactive Patient Education 2016 [place] Strain With Rehab A strain is an injury in which a tendon or muscle is torn. The muscles and tendons of the lower back are vulnerable to strains. However, these muscles and tendons are very strong and require a great force to be injured. Strains are classified into three categories. Grade 1 strains cause pain, but the tendon is not lengthened. Grade 2 strains include a  lengthened ligament, due to the ligament being stretched or partially ruptured. With grade 2 strains there is still function, although the function may be decreased. Grade 3 strains involve a complete tear of the tendon or muscle, and function is usually impaired. SYMPTOMS   Pain in the lower back.  Pain that affects one side more than the other.  Pain that gets worse with movement and may be felt in the hip, buttocks, or back of the thigh.  Muscle spasms of the muscles in the back.  Swelling along the muscles of the back.  Loss of strength of the back muscles.  Crackling sound (crepitation) when the muscles are touched. CAUSES  Lower back strains occur when a force is placed on the muscles or tendons that is greater than they can handle. Common causes of injury include:  Prolonged overuse of the muscle-tendon units in the lower back, usually from  incorrect posture.  A single violent injury or force applied to the back. RISK INCREASES WITH:  Sports that involve twisting forces on the spine or a lot of bending at the waist (football, rugby, weightlifting, bowling, golf, tennis, speed skating, racquetball, swimming, running, gymnastics, diving).  Poor strength and flexibility.  Failure to warm up properly before activity.  Family history of lower back pain or disk disorders.  Previous back injury or surgery (especially fusion).  Poor posture with lifting, especially heavy objects.  Prolonged sitting, especially with poor posture. PREVENTION   Learn and use proper posture when sitting or lifting (maintain proper posture when sitting, lift using the knees and legs, not at the waist).  Warm up and stretch properly before activity.  Allow for adequate recovery between workouts.  Maintain physical fitness:  Strength, flexibility, and endurance.  Cardiovascular fitness. PROGNOSIS  If treated properly, lower back strains usually heal within 6 weeks. RELATED COMPLICATIONS   Recurring symptoms, resulting in a chronic problem.  Chronic inflammation, scarring, and partial muscle-tendon tear.  Delayed healing or resolution of symptoms.  Prolonged disability. TREATMENT  Treatment first involves the use of ice and medicine, to reduce pain and inflammation. The use of strengthening and stretching exercises may help reduce pain with activity. These exercises may be performed at home or with a therapist. Severe injuries may require referral to a therapist for further evaluation and treatment, such as ultrasound. Your caregiver may advise that you wear a back brace or corset, to help reduce pain and discomfort. Often, prolonged bed rest results in greater harm then benefit. Corticosteroid injections may be recommended. However, these should be reserved for the most serious cases. It is important to avoid using your back when lifting  objects. At night, sleep on your back on a firm mattress with a pillow placed under your knees. If non-surgical treatment is unsuccessful, surgery may be needed.  MEDICATION   If pain medicine is needed, nonsteroidal anti-inflammatory medicines (aspirin and ibuprofen), or other minor pain relievers (acetaminophen), are often advised.  Do not take pain medicine for 7 days before surgery.  Prescription pain relievers may be given, if your caregiver thinks they are needed. Use only as directed and only as much as you need.  Ointments applied to the skin may be helpful.  Corticosteroid injections may be given by your caregiver. These injections should be reserved for the most serious cases, because they may only be given a certain number of times. HEAT AND COLD  Cold treatment (icing) should be applied for 10 to 15 minutes every 2 to 3 hours  for inflammation and pain, and immediately after activity that aggravates your symptoms. Use ice packs or an ice massage.  Heat treatment may be used before performing stretching and strengthening activities prescribed by your caregiver, physical therapist, or athletic trainer. Use a heat pack or a warm water soak. SEEK MEDICAL CARE IF:   Symptoms get worse or do not improve in 2 to 4 weeks, despite treatment.  You develop numbness, weakness, or loss of bowel or bladder function.  New, unexplained symptoms develop. (Drugs used in treatment may produce side effects.) EXERCISES  RANGE OF MOTION (ROM) AND STRETCHING EXERCISES - Low Back Strain Most people with lower back pain will find that their symptoms get worse with excessive bending forward (flexion) or arching at the lower back (extension). The exercises which will help resolve your symptoms will focus on the opposite motion.  Your physician, physical therapist or athletic trainer will help you determine which exercises will be most helpful to resolve your lower back pain. Do not complete any exercises  without first consulting with your caregiver. Discontinue any exercises which make your symptoms worse until you speak to your caregiver.  If you have pain, numbness or tingling which travels down into your buttocks, leg or foot, the goal of the therapy is for these symptoms to move closer to your back and eventually resolve. Sometimes, these leg symptoms will get better, but your lower back pain may worsen. This is typically an indication of progress in your rehabilitation. Be very alert to any changes in your symptoms and the activities in which you participated in the 24 hours prior to the change. Sharing this information with your caregiver will allow him/her to most efficiently treat your condition.  These exercises may help you when beginning to rehabilitate your injury. Your symptoms may resolve with or without further involvement from your physician, physical therapist or athletic trainer. While completing these exercises, remember:  Restoring tissue flexibility helps normal motion to return to the joints. This allows healthier, less painful movement and activity.  An effective stretch should be held for at least 30 seconds.  A stretch should never be painful. You should only feel a gentle lengthening or release in the stretched tissue. FLEXION RANGE OF MOTION AND STRETCHING EXERCISES: STRETCH - Flexion, Single Knee to Chest   Lie on a firm bed or floor with both legs extended in front of you.  Keeping one leg in contact with the floor, bring your opposite knee to your chest. Hold your leg in place by either grabbing behind your thigh or at your knee.  Pull until you feel a gentle stretch in your lower back. Hold __________ seconds.  Slowly release your grasp and repeat the exercise with the opposite side. Repeat __________ times. Complete this exercise __________ times per day.  STRETCH - Flexion, Double Knee to Chest   Lie on a firm bed or floor with both legs extended in front of  you.  Keeping one leg in contact with the floor, bring your opposite knee to your chest.  Tense your stomach muscles to support your back and then lift your other knee to your chest. Hold your legs in place by either grabbing behind your thighs or at your knees.  Pull both knees toward your chest until you feel a gentle stretch in your lower back. Hold __________ seconds.  Tense your stomach muscles and slowly return one leg at a time to the floor. Repeat __________ times. Complete this exercise __________ times per  day.  STRETCH - Low Trunk Rotation  Lie on a firm bed or floor. Keeping your legs in front of you, bend your knees so they are both pointed toward the ceiling and your feet are flat on the floor.  Extend your arms out to the side. This will stabilize your upper body by keeping your shoulders in contact with the floor.  Gently and slowly drop both knees together to one side until you feel a gentle stretch in your lower back. Hold for __________ seconds.  Tense your stomach muscles to support your lower back as you bring your knees back to the starting position. Repeat the exercise to the other side. Repeat __________ times. Complete this exercise __________ times per day  EXTENSION RANGE OF MOTION AND FLEXIBILITY EXERCISES: STRETCH - Extension, Prone on Elbows   Lie on your stomach on the floor, a bed will be too soft. Place your palms about shoulder width apart and at the height of your head.  Place your elbows under your shoulders. If this is too painful, stack pillows under your chest.  Allow your body to relax so that your hips drop lower and make contact more completely with the floor.  Hold this position for __________ seconds.  Slowly return to lying flat on the floor. Repeat __________ times. Complete this exercise __________ times per day.  RANGE OF MOTION - Extension, Prone Press Ups  Lie on your stomach on the floor, a bed will be too soft. Place your palms  about shoulder width apart and at the height of your head.  Keeping your back as relaxed as possible, slowly straighten your elbows while keeping your hips on the floor. You may adjust the placement of your hands to maximize your comfort. As you gain motion, your hands will come more underneath your shoulders.  Hold this position __________ seconds.  Slowly return to lying flat on the floor. Repeat __________ times. Complete this exercise __________ times per day.  RANGE OF MOTION- Quadruped, Neutral Spine   Assume a hands and knees position on a firm surface. Keep your hands under your shoulders and your knees under your hips. You may place padding under your knees for comfort.  Drop your head and point your tail bone toward the ground below you. This will round out your lower back like an angry cat. Hold this position for __________ seconds.  Slowly lift your head and release your tail bone so that your back sags into a large arch, like an old horse.  Hold this position for __________ seconds.  Repeat this until you feel limber in your lower back.  Now, find your "sweet spot." This will be the most comfortable position somewhere between the two previous positions. This is your neutral spine. Once you have found this position, tense your stomach muscles to support your lower back.  Hold this position for __________ seconds. Repeat __________ times. Complete this exercise __________ times per day.  STRENGTHENING EXERCISES - Low Back Strain These exercises may help you when beginning to rehabilitate your injury. These exercises should be done near your "sweet spot." This is the neutral, low-back arch, somewhere between fully rounded and fully arched, that is your least painful position. When performed in this safe range of motion, these exercises can be used for people who have either a flexion or extension based injury. These exercises may resolve your symptoms with or without further  involvement from your physician, physical therapist or athletic trainer. While completing these exercises,  remember:   Muscles can gain both the endurance and the strength needed for everyday activities through controlled exercises.  Complete these exercises as instructed by your physician, physical therapist or athletic trainer. Increase the resistance and repetitions only as guided.  You may experience muscle soreness or fatigue, but the pain or discomfort you are trying to eliminate should never worsen during these exercises. If this pain does worsen, stop and make certain you are following the directions exactly. If the pain is still present after adjustments, discontinue the exercise until you can discuss the trouble with your caregiver. STRENGTHENING - Deep Abdominals, Pelvic Tilt  Lie on a firm bed or floor. Keeping your legs in front of you, bend your knees so they are both pointed toward the ceiling and your feet are flat on the floor.  Tense your lower abdominal muscles to press your lower back into the floor. This motion will rotate your pelvis so that your tail bone is scooping upwards rather than pointing at your feet or into the floor.  With a gentle tension and even breathing, hold this position for __________ seconds. Repeat __________ times. Complete this exercise __________ times per day.  STRENGTHENING - Abdominals, Crunches   Lie on a firm bed or floor. Keeping your legs in front of you, bend your knees so they are both pointed toward the ceiling and your feet are flat on the floor. Cross your arms over your chest.  Slightly tip your chin down without bending your neck.  Tense your abdominals and slowly lift your trunk high enough to just clear your shoulder blades. Lifting higher can put excessive stress on the lower back and does not further strengthen your abdominal muscles.  Control your return to the starting position. Repeat __________ times. Complete this exercise  __________ times per day.  STRENGTHENING - Quadruped, Opposite UE/LE Lift   Assume a hands and knees position on a firm surface. Keep your hands under your shoulders and your knees under your hips. You may place padding under your knees for comfort.  Find your neutral spine and gently tense your abdominal muscles so that you can maintain this position. Your shoulders and hips should form a rectangle that is parallel with the floor and is not twisted.  Keeping your trunk steady, lift your right hand no higher than your shoulder and then your left leg no higher than your hip. Make sure you are not holding your breath. Hold this position __________ seconds.  Continuing to keep your abdominal muscles tense and your back steady, slowly return to your starting position. Repeat with the opposite arm and leg. Repeat __________ times. Complete this exercise __________ times per day.  STRENGTHENING - Lower Abdominals, Double Knee Lift  Lie on a firm bed or floor. Keeping your legs in front of you, bend your knees so they are both pointed toward the ceiling and your feet are flat on the floor.  Tense your abdominal muscles to brace your lower back and slowly lift both of your knees until they come over your hips. Be certain not to hold your breath.  Hold __________ seconds. Using your abdominal muscles, return to the starting position in a slow and controlled manner. Repeat __________ times. Complete this exercise __________ times per day.  POSTURE AND BODY MECHANICS CONSIDERATIONS - Low Back Strain Keeping correct posture when sitting, standing or completing your activities will reduce the stress put on different body tissues, allowing injured tissues a chance to heal and limiting painful  experiences. The following are general guidelines for improved posture. Your physician or physical therapist will provide you with any instructions specific to your needs. While reading these guidelines, remember:  The  exercises prescribed by your provider will help you have the flexibility and strength to maintain correct postures.  The correct posture provides the best environment for your joints to work. All of your joints have less wear and tear when properly supported by a spine with good posture. This means you will experience a healthier, less painful body.  Correct posture must be practiced with all of your activities, especially prolonged sitting and standing. Correct posture is as important when doing repetitive low-stress activities (typing) as it is when doing a single heavy-load activity (lifting). RESTING POSITIONS Consider which positions are most painful for you when choosing a resting position. If you have pain with flexion-based activities (sitting, bending, stooping, squatting), choose a position that allows you to rest in a less flexed posture. You would want to avoid curling into a fetal position on your side. If your pain worsens with extension-based activities (prolonged standing, working overhead), avoid resting in an extended position such as sleeping on your stomach. Most people will find more comfort when they rest with their spine in a more neutral position, neither too rounded nor too arched. Lying on a non-sagging bed on your side with a pillow between your knees, or on your back with a pillow under your knees will often provide some relief. Keep in mind, being in any one position for a prolonged period of time, no matter how correct your posture, can still lead to stiffness. PROPER SITTING POSTURE In order to minimize stress and discomfort on your spine, you must sit with correct posture. Sitting with good posture should be effortless for a healthy body. Returning to good posture is a gradual process. Many people can work toward this most comfortably by using various supports until they have the flexibility and strength to maintain this posture on their own. When sitting with proper posture,  your ears will fall over your shoulders and your shoulders will fall over your hips. You should use the back of the chair to support your upper back. Your lower back will be in a neutral position, just slightly arched. You may place a small pillow or folded towel at the base of your lower back for support.  When working at a desk, create an environment that supports good, upright posture. Without extra support, muscles tire, which leads to excessive strain on joints and other tissues. Keep these recommendations in mind: CHAIR:  A chair should be able to slide under your desk when your back makes contact with the back of the chair. This allows you to work closely.  The chair's height should allow your eyes to be level with the upper part of your monitor and your hands to be slightly lower than your elbows. BODY POSITION  Your feet should make contact with the floor. If this is not possible, use a foot rest.  Keep your ears over your shoulders. This will reduce stress on your neck and lower back. INCORRECT SITTING POSTURES  If you are feeling tired and unable to assume a healthy sitting posture, do not slouch or slump. This puts excessive strain on your back tissues, causing more damage and pain. Healthier options include:  Using more support, like a lumbar pillow.  Switching tasks to something that requires you to be upright or walking.  Talking a brief walk.  Lying down to rest in a neutral-spine position. PROLONGED STANDING WHILE SLIGHTLY LEANING FORWARD  When completing a task that requires you to lean forward while standing in one place for a long time, place either foot up on a stationary 2-4 inch high object to help maintain the best posture. When both feet are on the ground, the lower back tends to lose its slight inward curve. If this curve flattens (or becomes too large), then the back and your other joints will experience too much stress, tire more quickly, and can cause  pain. CORRECT STANDING POSTURES Proper standing posture should be assumed with all daily activities, even if they only take a few moments, like when brushing your teeth. As in sitting, your ears should fall over your shoulders and your shoulders should fall over your hips. You should keep a slight tension in your abdominal muscles to brace your spine. Your tailbone should point down to the ground, not behind your body, resulting in an over-extended swayback posture.  INCORRECT STANDING POSTURES  Common incorrect standing postures include a forward head, locked knees and/or an excessive swayback. WALKING Walk with an upright posture. Your ears, shoulders and hips should all line-up. PROLONGED ACTIVITY IN A FLEXED POSITION When completing a task that requires you to bend forward at your waist or lean over a low surface, try to find a way to stabilize 3 out of 4 of your limbs. You can place a hand or elbow on your thigh or rest a knee on the surface you are reaching across. This will provide you more stability so that your muscles do not fatigue as quickly. By keeping your knees relaxed, or slightly bent, you will also reduce stress across your lower back. CORRECT LIFTING TECHNIQUES DO :   Assume a wide stance. This will provide you more stability and the opportunity to get as close as possible to the object which you are lifting.  Tense your abdominals to brace your spine. Bend at the knees and hips. Keeping your back locked in a neutral-spine position, lift using your leg muscles. Lift with your legs, keeping your back straight.  Test the weight of unknown objects before attempting to lift them.  Try to keep your elbows locked down at your sides in order get the best strength from your shoulders when carrying an object.  Always ask for help when lifting heavy or awkward objects. INCORRECT LIFTING TECHNIQUES DO NOT:   Lock your knees when lifting, even if it is a small object.  Bend and  twist. Pivot at your feet or move your feet when needing to change directions.  Assume that you can safely pick up even a paper clip without proper posture.   This information is not intended to replace advice given to you by your health care provider. Make sure you discuss any questions you have with your health care provider.   Document Released: 04/08/2005 Document Revised: 04/29/2014 Document Reviewed: 07/21/2008 Elsevier Interactive Patient Education 2016 [employer].    IF you received an x-ray today, you will receive an invoice from [facility]. Please contact [facility] at [phone] with questions or concerns regarding your invoice.   IF you received labwork today, you will receive an invoice from [employer]. Please contact [facility] at [phone] with questions or concerns regarding your invoice.   Our billing staff will not be able to assist you with questions regarding bills from these companies.  You will be contacted with the lab results  as soon as they are available. The fastest way to get your results is to activate your My Chart account. Instructions are located on the last page of this paperwork. If you have not heard from [place] regarding the results in 2 weeks, please contact this office.

## 2016-02-11 IMAGING — US US RENAL
1 series · 14 of 25 positions shown · non-contrast
Comparison: None.

CLINICAL DATA: Outside MRI demonstrated a right renal cyst.

EXAM:
RENAL / URINARY TRACT ULTRASOUND COMPLETE

[Series 1: us renal · 0.25mm/px · 14 of 44 slices shown]
[im 1/44]
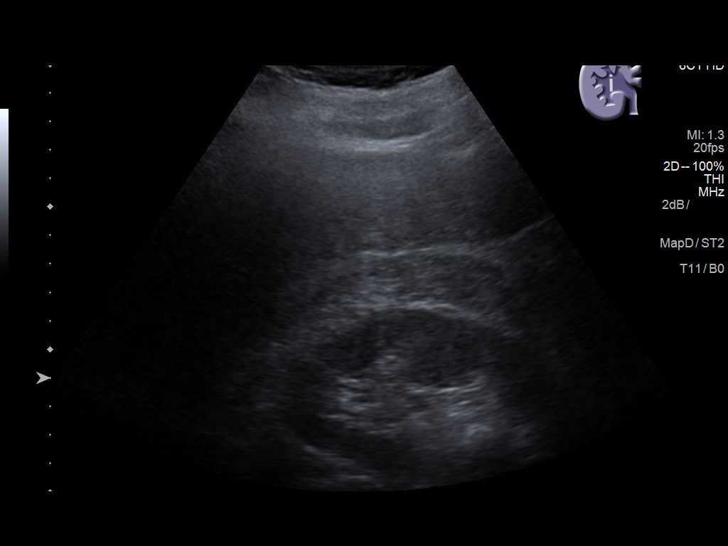
[im 4/44]
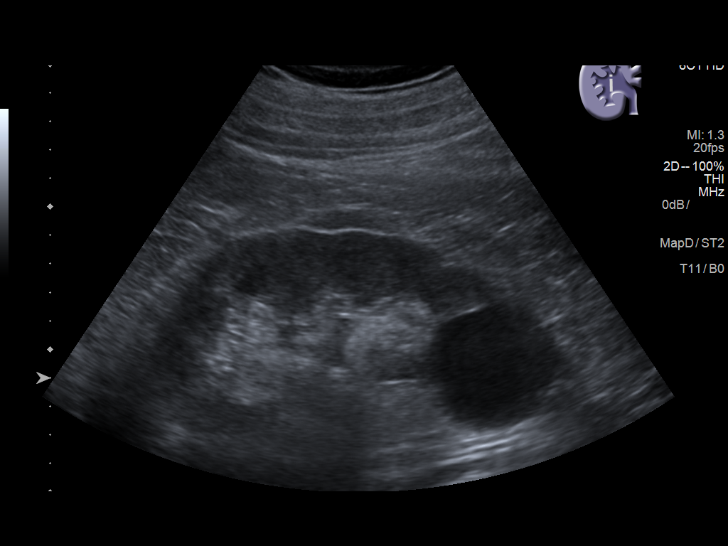
[im 8/44]
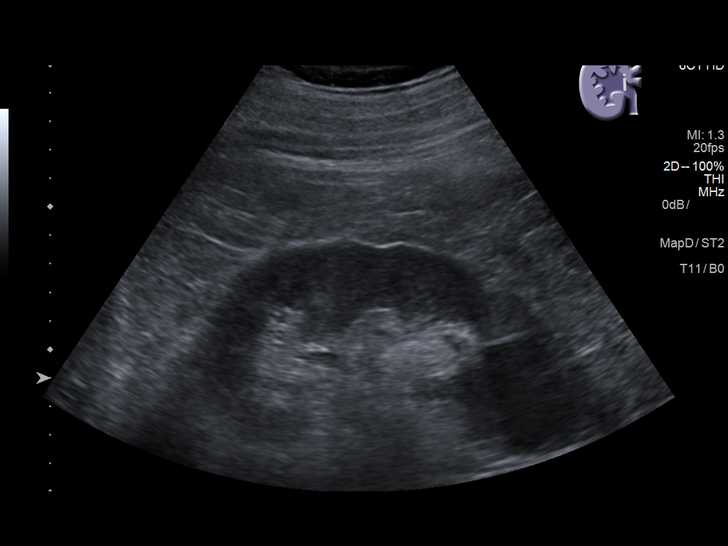
[im 11/44]
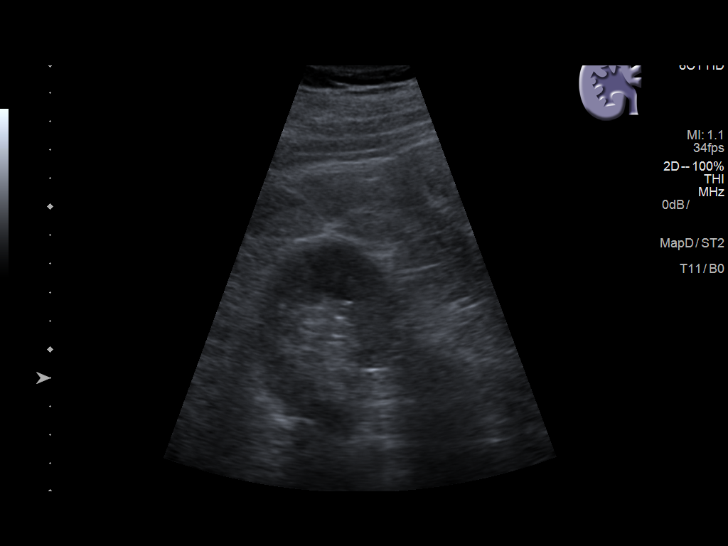
[im 15/44]
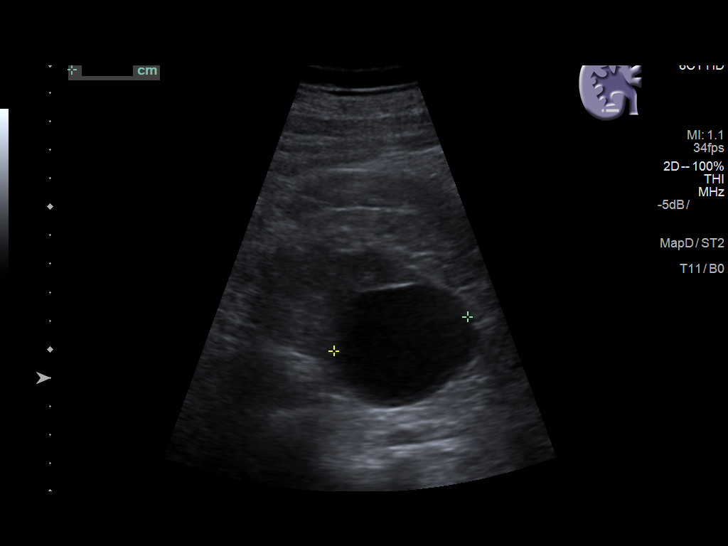
[im 17/44]
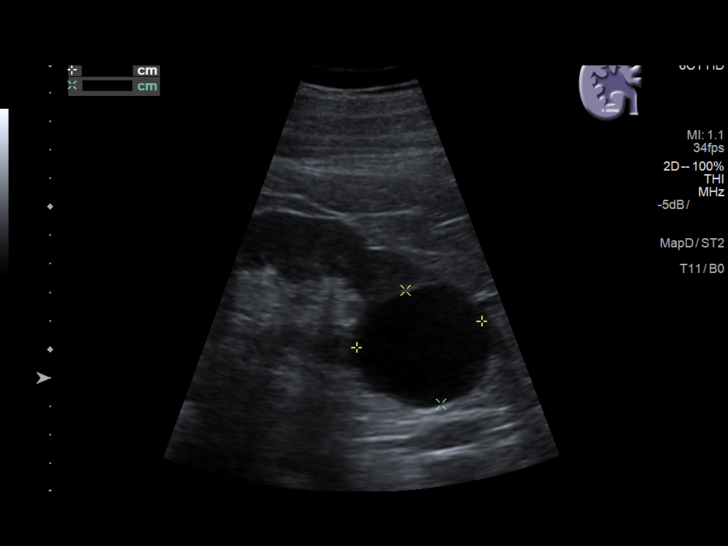
[im 20/44]
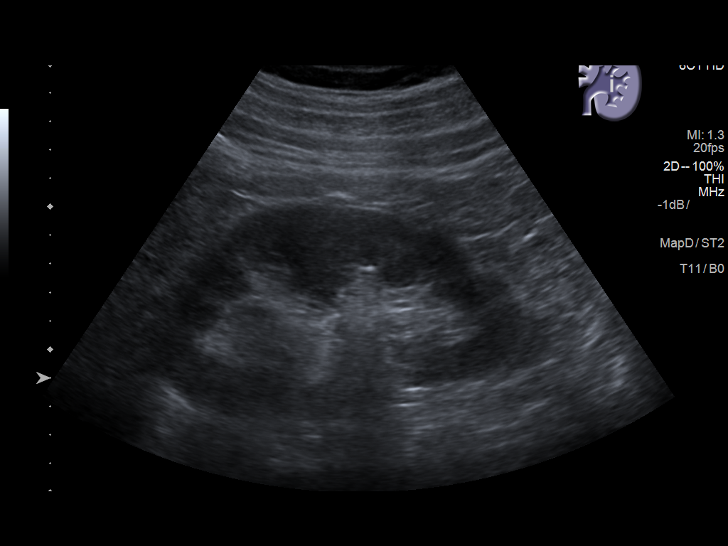
[im 24/44]
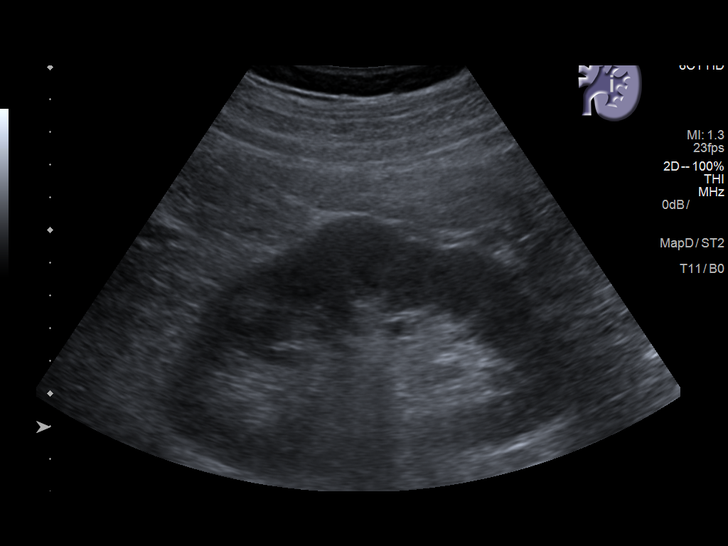
[im 27/44]
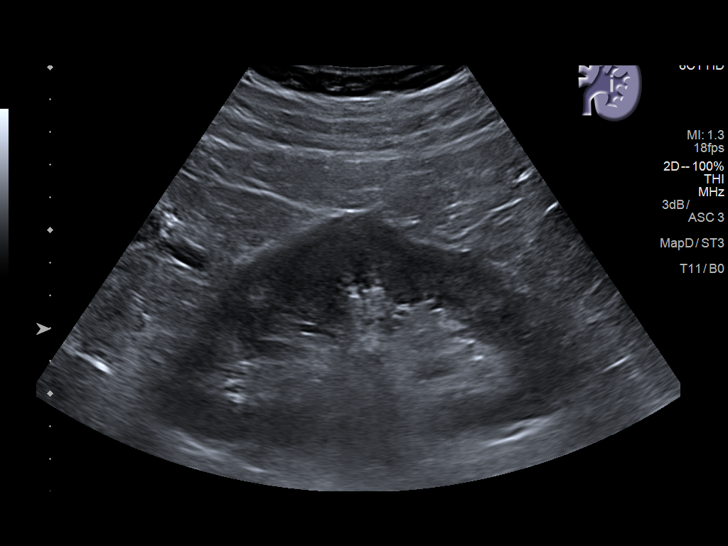
[im 29/44]
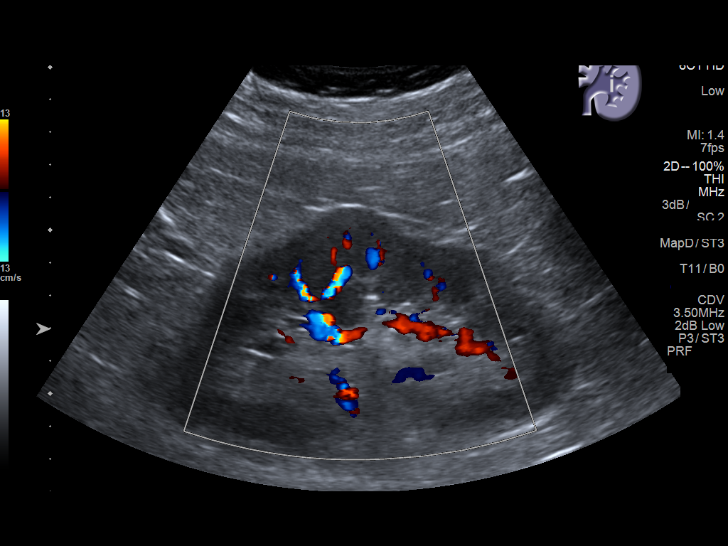
[im 33/44]
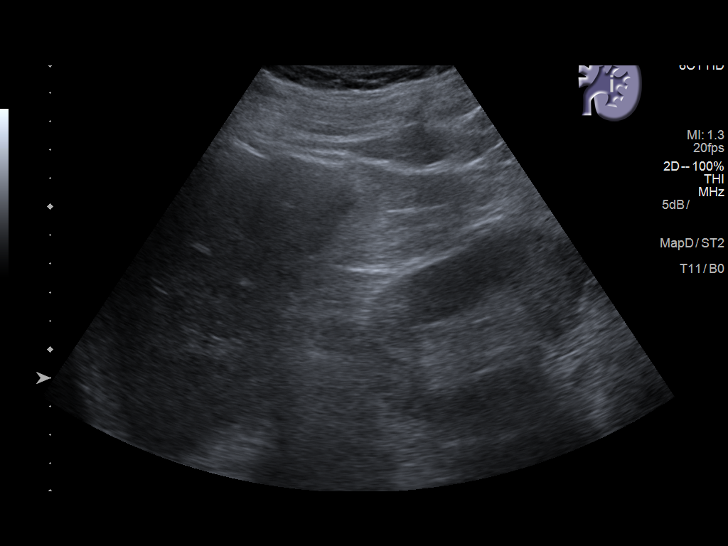
[im 36/44]
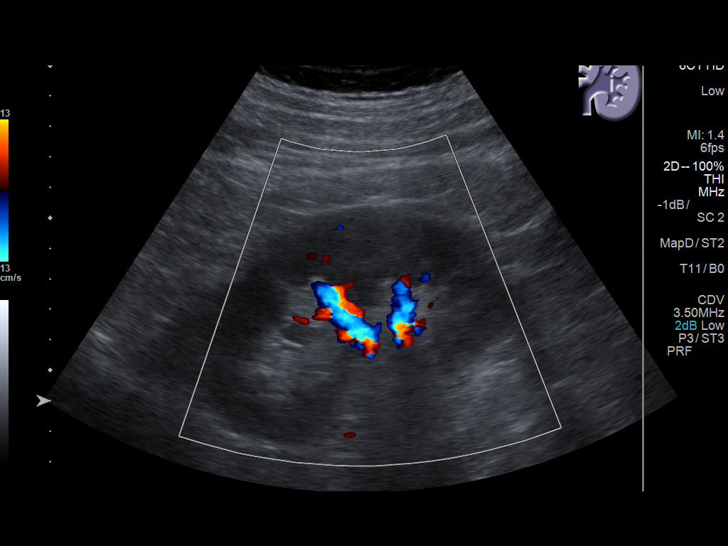
[im 40/44]
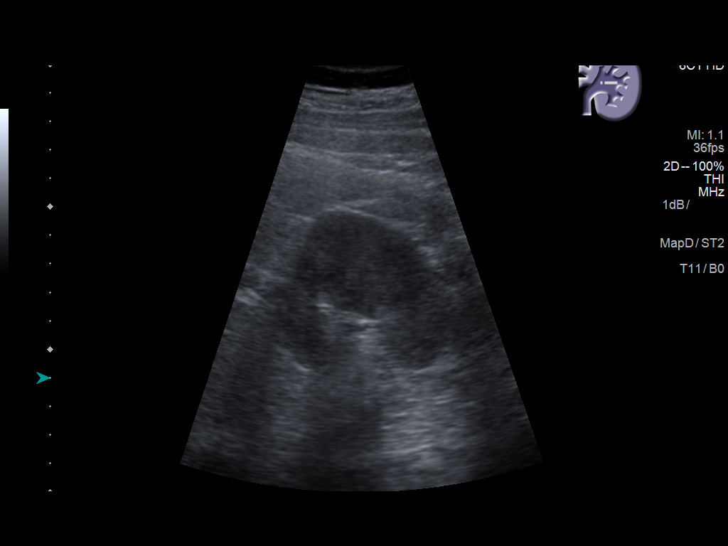
[im 44/44]
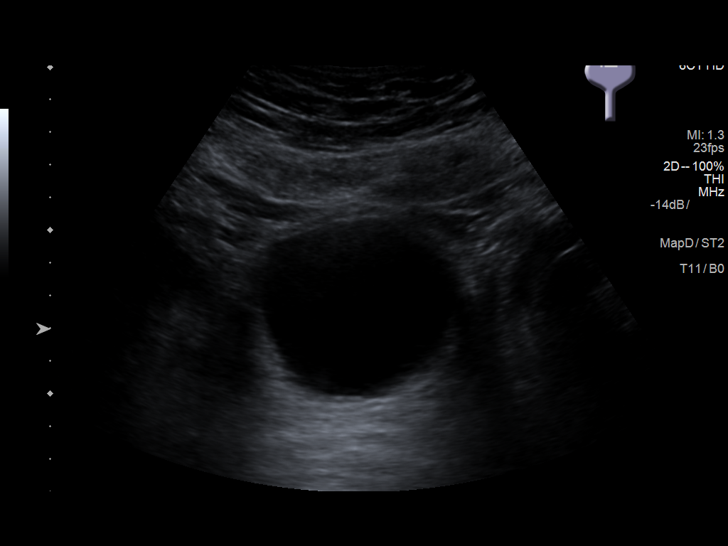

[14 of 25 positions shown; findings below may reference images not displayed]

FINDINGS: Right Kidney:

Length: 11.5 cm. A 4.8 cm cyst is seen of the lower pole right
kidney.

Left Kidney:

Length: 13 cm. A contour abnormality in the lateral left kidney is
thought to be a dromedary hump rather than a mass. By report, the
outside MRI did not mention the left kidney.

Bladder:

Appears normal for degree of bladder distention.
IMPRESSION: 1. 4.8 cm cyst off the lower pole the right kidney.
2. The contour abnormality in the lateral left kidney is favored
represent a dromedary hump.

## 2016-03-16 ENCOUNTER — Other Ambulatory Visit: Payer: Self-pay | Admitting: Family Medicine

## 2016-03-18 NOTE — Telephone Encounter (Signed)
Received refill electronically Last refill 03/05/15 #30/12 Last office visit 07/04/15

## 2016-03-19 NOTE — Telephone Encounter (Signed)
Sent. Has OV pending re: DM2.  Thanks.

## 2016-03-24 ENCOUNTER — Other Ambulatory Visit: Payer: Self-pay | Admitting: Family Medicine

## 2016-03-24 DIAGNOSIS — E119 Type 2 diabetes mellitus without complications: Secondary | ICD-10-CM

## 2016-03-27 ENCOUNTER — Other Ambulatory Visit (INDEPENDENT_AMBULATORY_CARE_PROVIDER_SITE_OTHER): Payer: BC Managed Care – PPO

## 2016-03-27 DIAGNOSIS — E119 Type 2 diabetes mellitus without complications: Secondary | ICD-10-CM | POA: Diagnosis not present

## 2016-03-27 LAB — HEMOGLOBIN A1C: HEMOGLOBIN A1C: 8.2 % — AB (ref 4.6–6.5)

## 2016-03-27 LAB — COMPREHENSIVE METABOLIC PANEL
ALBUMIN: 3.7 g/dL (ref 3.5–5.2)
ALK PHOS: 152 U/L — AB (ref 39–117)
ALT: 45 U/L (ref 0–53)
AST: 37 U/L (ref 0–37)
BUN: 16 mg/dL (ref 6–23)
CO2: 25 mEq/L (ref 19–32)
CREATININE: 1.05 mg/dL (ref 0.40–1.50)
Calcium: 9.3 mg/dL (ref 8.4–10.5)
Chloride: 102 mEq/L (ref 96–112)
GFR: 76.62 mL/min (ref >60.00)
Glucose, Bld: 348 mg/dL — ABNORMAL HIGH (ref 70–99)
Potassium: 4.2 mEq/L (ref 3.5–5.1)
SODIUM: 134 meq/L — AB (ref 135–145)
TOTAL PROTEIN: 6.7 g/dL (ref 6.0–8.3)
Total Bilirubin: 1.1 mg/dL (ref 0.2–1.2)

## 2016-03-27 LAB — LIPID PANEL
CHOLESTEROL: 206 mg/dL — AB (ref 0–200)
HDL: 37.9 mg/dL — ABNORMAL LOW (ref >39.00)
LDL Cholesterol: 138 mg/dL — ABNORMAL HIGH (ref 0–99)
NonHDL: 168.07
Total CHOL/HDL Ratio: 5
Triglycerides: 151 mg/dL — ABNORMAL HIGH (ref 0.0–149.0)
VLDL: 30.2 mg/dL (ref 0.0–40.0)

## 2016-04-02 ENCOUNTER — Encounter: Payer: Self-pay | Admitting: Family Medicine

## 2016-04-02 ENCOUNTER — Ambulatory Visit (INDEPENDENT_AMBULATORY_CARE_PROVIDER_SITE_OTHER): Payer: BC Managed Care – PPO | Admitting: Family Medicine

## 2016-04-02 VITALS — BP 132/100 | HR 95 | Temp 98.4°F | Wt 306.2 lb

## 2016-04-02 DIAGNOSIS — I10 Essential (primary) hypertension: Secondary | ICD-10-CM

## 2016-04-02 DIAGNOSIS — Z7189 Other specified counseling: Secondary | ICD-10-CM

## 2016-04-02 DIAGNOSIS — Z86711 Personal history of pulmonary embolism: Secondary | ICD-10-CM

## 2016-04-02 DIAGNOSIS — G8929 Other chronic pain: Secondary | ICD-10-CM

## 2016-04-02 DIAGNOSIS — Z0001 Encounter for general adult medical examination with abnormal findings: Secondary | ICD-10-CM | POA: Diagnosis not present

## 2016-04-02 DIAGNOSIS — M549 Dorsalgia, unspecified: Secondary | ICD-10-CM

## 2016-04-02 DIAGNOSIS — E119 Type 2 diabetes mellitus without complications: Secondary | ICD-10-CM

## 2016-04-02 DIAGNOSIS — M6530 Trigger finger, unspecified finger: Secondary | ICD-10-CM

## 2016-04-02 MED ORDER — RIVAROXABAN 20 MG PO TABS: ORAL_TABLET | ORAL | 3 refills | Status: DC

## 2016-04-02 MED ORDER — LISINOPRIL 10 MG PO TABS: 10.0000 mg | ORAL_TABLET | Freq: Every day | ORAL | 3 refills | Status: DC

## 2016-04-02 MED ORDER — SITAGLIPTIN PHOSPHATE 100 MG PO TABS: 100.0000 mg | ORAL_TABLET | Freq: Every day | ORAL | 3 refills | Status: DC

## 2016-04-02 MED ORDER — GLIMEPIRIDE 4 MG PO TABS: 8.0000 mg | ORAL_TABLET | Freq: Every day | ORAL | 3 refills | Status: DC

## 2016-04-02 NOTE — Progress Notes (Signed)
Pre visit review using our clinic review tool, if applicable. No additional management support is needed unless otherwise documented below in the visit note. 

## 2016-04-02 NOTE — Patient Instructions (Signed)
Call about an eye exam when possible.  Recheck in about 3 months, after your back is better.  Labs ahead of time.  Take care.  Glad to see you.  Update me as needed in the meantime.

## 2016-04-02 NOTE — Progress Notes (Signed)
CPE- See plan.  Routine anticipatory guidance given to patient.  See health maintenance. Tetanus 2016 Flu today PNA vaccine up to date.  Shingles not due.   Colonoscopy 2016 Prostate cancer screening and PSA options (with potential risks and benefits of testing vs not testing) were discussed along with recent recs/guidelines.  He declined testing PSA at this point. Living will d/w pt.  Wife designated if patient were incapacitated.   Diet and exercise affected by back pain.    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  No ADE on med.  No bleeding.  No reason to stop anticoagulation  He has had f/u with spine clinic re: L spine compression fracture.  Imaging report d/w pt:   FINDINGS: #Vertebral bodies: There is mild compression of the inferior aspect of the L2 vertebral body, predominantly centrally. There is associated marrow edema in the inferior two thirds of the L2 vertebral body indicating a recent compression fracture.  Remaining lumbar vertebral bodies are normal. #Alignment: Normal. #Marrow signal: Marrow edema and L2 as described above. #Conus medullaris: Normal. Terminates at L1 with no evidence of tethering. #Lower thoracic segments: No significant abnormality. #  #L1-2: Normal. #L2-3: Mild degenerative disc disease. Mild facet joint arthritis. #L3-4: Normal. #L4-5: Normal. #L5-S1: Moderate degenerative disc disease. Moderate facet joint arthritis.  Still on hydrocodone for back pain. No ADE on med.    Hypertension:    Using medication without problems or lightheadedness:yes.  Chest pain with exertion:no- only with prev injury did he have chest wall pain.  Edema:occ, at baseline Short of breath:no Had run out of lisinopril, discussed with patient.  Diabetes:  Using medications without difficulties: yes Hypoglycemic episodes:only if prolonged fasting Hyperglycemic episodes:no Feet problems: only tingling from prev injury- at  baseline Blood Sugars averaging: ~150 eye exam within last year: had to be deferred with prev back injury.    L 2nd finger occ locking with flexion. Stiffer in the AM, better as the day goes on.  We can refer to hand clinic if he doesn't improve on his own.  He agrees.  Defer for now.  He can put up with sx for now, while he has his back addressed.    PMH and SH reviewed  Meds, vitals, and allergies reviewed.   ROS: Per HPI.  Unless specifically indicated otherwise in HPI, the patient denies:  General: fever. Eyes: acute vision changes ENT: sore throat Cardiovascular: chest pain Respiratory: SOB GI: vomiting GU: dysuria Musculoskeletal: acute back pain Derm: acute rash Neuro: acute motor dysfunction Psych: worsening mood Endocrine: polydipsia Heme: bleeding Allergy: hayfever  GEN: nad, alert and oriented HEENT: mucous membranes moist NECK: supple w/o LA CV: rrr. PULM: ctab, no inc wob ABD: soft, +bs EXT: no edema SKIN: no acute rash

## 2016-04-03 DIAGNOSIS — M6530 Trigger finger, unspecified finger: Secondary | ICD-10-CM | POA: Insufficient documentation

## 2016-04-03 DIAGNOSIS — Z7189 Other specified counseling: Secondary | ICD-10-CM | POA: Insufficient documentation

## 2016-04-03 DIAGNOSIS — Z0001 Encounter for general adult medical examination with abnormal findings: Secondary | ICD-10-CM | POA: Insufficient documentation

## 2016-04-03 NOTE — Assessment & Plan Note (Signed)
Diet and exercise affected by pain and back injury. This likely affected his A1c. Discussed with patient. He agrees. He will get his back addressed and then start working on diet and exercise. Recheck in a few months. He agrees.

## 2016-04-03 NOTE — Assessment & Plan Note (Signed)
Living will d/w pt.  Wife designated if patient were incapacitated.   ?

## 2016-04-03 NOTE — Assessment & Plan Note (Signed)
Tetanus 2016 Flu today PNA vaccine up to date.  Shingles not due.   Colonoscopy 2016 Prostate cancer screening and PSA options (with potential risks and benefits of testing vs not testing) were discussed along with recent recs/guidelines.  He declined testing PSA at this point. Living will d/w pt.  Wife designated if patient were incapacitated.   Diet and exercise affected by back pain.

## 2016-04-03 NOTE — Assessment & Plan Note (Signed)
He will follow-up with the spine clinic for likely surgery. Discussed with patient as above. I will defer to the spine clinic.

## 2016-04-03 NOTE — Assessment & Plan Note (Signed)
Restart lisinopril. Gradually work on weight loss. This is affected by pain and his back injury.

## 2016-04-03 NOTE — Assessment & Plan Note (Signed)
Continue anticoagulation. No adverse effect. He agrees.

## 2016-04-03 NOTE — Assessment & Plan Note (Signed)
He is going to get his back addressed first. If symptoms worsen he will let me know and we can refer him. He agrees.

## 2016-06-17 ENCOUNTER — Other Ambulatory Visit: Payer: Self-pay | Admitting: Family Medicine

## 2016-06-17 DIAGNOSIS — E119 Type 2 diabetes mellitus without complications: Secondary | ICD-10-CM

## 2016-06-27 ENCOUNTER — Other Ambulatory Visit (INDEPENDENT_AMBULATORY_CARE_PROVIDER_SITE_OTHER): Payer: BC Managed Care – PPO

## 2016-06-27 ENCOUNTER — Encounter (INDEPENDENT_AMBULATORY_CARE_PROVIDER_SITE_OTHER): Payer: Self-pay

## 2016-06-27 DIAGNOSIS — E119 Type 2 diabetes mellitus without complications: Secondary | ICD-10-CM

## 2016-06-27 LAB — HEMOGLOBIN A1C: Hgb A1c MFr Bld: 8.9 % — ABNORMAL HIGH (ref 4.6–6.5)

## 2016-07-02 ENCOUNTER — Other Ambulatory Visit: Payer: Self-pay | Admitting: Orthopedic Surgery

## 2016-07-02 ENCOUNTER — Ambulatory Visit: Payer: BC Managed Care – PPO | Admitting: Family Medicine

## 2016-07-04 ENCOUNTER — Encounter: Payer: Self-pay | Admitting: Family Medicine

## 2016-07-04 ENCOUNTER — Telehealth: Payer: Self-pay | Admitting: Family Medicine

## 2016-07-04 ENCOUNTER — Ambulatory Visit (INDEPENDENT_AMBULATORY_CARE_PROVIDER_SITE_OTHER): Payer: BC Managed Care – PPO | Admitting: Family Medicine

## 2016-07-04 DIAGNOSIS — E119 Type 2 diabetes mellitus without complications: Secondary | ICD-10-CM

## 2016-07-04 DIAGNOSIS — G8929 Other chronic pain: Secondary | ICD-10-CM

## 2016-07-04 DIAGNOSIS — M549 Dorsalgia, unspecified: Secondary | ICD-10-CM | POA: Diagnosis not present

## 2016-07-04 NOTE — Patient Instructions (Addendum)
Check with your insurance to see if they will cover the shingles shot. Plan on recheck labs prior to a visit in about 3 months.   Update me about your sugar about 1 week after the surgery.   I'll check with ortho in the meantime.  Take care.  Glad to see you.

## 2016-07-04 NOTE — Progress Notes (Signed)
Diabetes:  Using medications without difficulties:yes Hypoglycemic episodes:no Hyperglycemic episodes: higher than prev, see below.   Feet problems: no Blood Sugars averaging: usually 160s in the AM eye exam within last year: Pending, d/w pt.   Diet has been off, exercise limited with back pain.   A1c up ,d/w pt.    We don't have flu shot to give to patient today.  D/w pt.    Back surgery is scheduled for 1 week from today.  On xarelto qd.    PMH and SH reviewed  Meds, vitals, and allergies reviewed.   ROS: Per HPI unless specifically indicated in ROS section   GEN: nad, alert and oriented HEENT: mucous membranes moist NECK: supple w/o LA,  CV: rrr. PULM: ctab, no inc wob ABD: soft, +bs EXT: no edema  Diabetic foot exam: Normal inspection No skin breakdown No calluses  Normal DP pulses Normal sensation to light touch and monofilament Nails normal

## 2016-07-04 NOTE — Telephone Encounter (Signed)
Call to Dr Lynann Bologna regarding patient blood thinner and upcoming surgery.  Dr Lynann Bologna is in surgery and will call when available.

## 2016-07-05 ENCOUNTER — Telehealth: Payer: Self-pay | Admitting: Family Medicine

## 2016-07-05 NOTE — Telephone Encounter (Signed)
-----   Message from Amado Coe, Leonard sent at 07/04/2016 10:23 AM EDT ----- Dr Lynann Bologna was in surgery, left message for him to call. ----- Message ----- From: Tonia Ghent, MD Sent: 07/04/2016   9:48 AM To: Josetta Huddle, CMA  Please get Dr. Lynann Bologna on the phone- ortho clinic.  I need to talk to him about pt's xarelto and DM2.  Thanks.  Brigitte Pulse

## 2016-07-05 NOTE — Assessment & Plan Note (Addendum)
We called over to orthopedics today to talk to his surgeon. We left a message since he was in surgery. The plan would likely be to hold his xarelto for 3 days, which would include the day of surgery, and then restart the day after surgery. This is not the final plan yet as I have not talked to his Psychologist, sport and exercise. I reserve the right talked with surgeon first. Explained to patient, he agreed.

## 2016-07-05 NOTE — Telephone Encounter (Addendum)
Please call pt.  I talked to Dr. Lynann Bologna.   Reasonable to hold xarelto on Tuesday Wednesday and Thursday of next week.  Thursday is the day of the procedure. Stay off on Friday, restart Saturday.  Thanks.

## 2016-07-05 NOTE — Telephone Encounter (Signed)
Spoke with patient and gave him instructions to hold Xarelto Tues, Wed, Thurs, Fri and restart on Saturday per MD order. We will not be bridging with Lovenox.  Patient verbalizes understanding of instructions and will call us if he has any further questions.

## 2016-07-05 NOTE — Assessment & Plan Note (Signed)
Diet and exercise have both been off recently. Hopefully his ability to exercise will improve after back surgery which is pending. At this point his sugar should not prevent him from having the surgery. He'll start to work harder on diet in the meantime. Discussed with patient. He agrees.

## 2016-07-07 NOTE — Telephone Encounter (Signed)
Thanks

## 2016-07-09 ENCOUNTER — Ambulatory Visit (HOSPITAL_COMMUNITY)
Admission: RE | Admit: 2016-07-09 | Discharge: 2016-07-09 | Disposition: A | Discharge status: Home / Self Care | Payer: Worker's Compensation | Source: Ambulatory Visit | Attending: Orthopedic Surgery | Admitting: Orthopedic Surgery

## 2016-07-09 ENCOUNTER — Encounter (HOSPITAL_COMMUNITY): Payer: Self-pay

## 2016-07-09 ENCOUNTER — Encounter (HOSPITAL_COMMUNITY)
Admission: RE | Admit: 2016-07-09 | Discharge: 2016-07-09 | Disposition: A | Discharge status: Home / Self Care | Payer: Worker's Compensation | Source: Ambulatory Visit | Attending: Orthopedic Surgery | Admitting: Orthopedic Surgery

## 2016-07-09 DIAGNOSIS — Z01818 Encounter for other preprocedural examination: Secondary | ICD-10-CM | POA: Insufficient documentation

## 2016-07-09 DIAGNOSIS — S32029A Unspecified fracture of second lumbar vertebra, initial encounter for closed fracture: Secondary | ICD-10-CM | POA: Diagnosis present

## 2016-07-09 DIAGNOSIS — Z86711 Personal history of pulmonary embolism: Secondary | ICD-10-CM | POA: Diagnosis not present

## 2016-07-09 DIAGNOSIS — Z79899 Other long term (current) drug therapy: Secondary | ICD-10-CM | POA: Diagnosis not present

## 2016-07-09 DIAGNOSIS — Z86718 Personal history of other venous thrombosis and embolism: Secondary | ICD-10-CM | POA: Diagnosis not present

## 2016-07-09 DIAGNOSIS — Z6841 Body Mass Index (BMI) 40.0 and over, adult: Secondary | ICD-10-CM | POA: Diagnosis not present

## 2016-07-09 DIAGNOSIS — Z7901 Long term (current) use of anticoagulants: Secondary | ICD-10-CM | POA: Diagnosis not present

## 2016-07-09 DIAGNOSIS — Z7984 Long term (current) use of oral hypoglycemic drugs: Secondary | ICD-10-CM | POA: Diagnosis not present

## 2016-07-09 DIAGNOSIS — E1151 Type 2 diabetes mellitus with diabetic peripheral angiopathy without gangrene: Secondary | ICD-10-CM | POA: Diagnosis not present

## 2016-07-09 DIAGNOSIS — X58XXXA Exposure to other specified factors, initial encounter: Secondary | ICD-10-CM | POA: Diagnosis not present

## 2016-07-09 DIAGNOSIS — I10 Essential (primary) hypertension: Secondary | ICD-10-CM | POA: Diagnosis not present

## 2016-07-09 DIAGNOSIS — Z87891 Personal history of nicotine dependence: Secondary | ICD-10-CM | POA: Diagnosis not present

## 2016-07-09 DIAGNOSIS — Y990 Civilian activity done for income or pay: Secondary | ICD-10-CM | POA: Diagnosis not present

## 2016-07-09 HISTORY — DX: Headache, unspecified: R51.9

## 2016-07-09 HISTORY — DX: Headache: R51

## 2016-07-09 LAB — COMPREHENSIVE METABOLIC PANEL
ALK PHOS: 127 U/L — AB (ref 38–126)
ALT: 52 U/L (ref 17–63)
AST: 50 U/L — AB (ref 15–41)
Albumin: 3.4 g/dL — ABNORMAL LOW (ref 3.5–5.0)
Anion gap: 9 (ref 5–15)
BUN: 12 mg/dL (ref 6–20)
CALCIUM: 9.3 mg/dL (ref 8.9–10.3)
CO2: 23 mmol/L (ref 22–32)
CREATININE: 0.93 mg/dL (ref 0.61–1.24)
Chloride: 107 mmol/L (ref 101–111)
GFR calc Af Amer: >60 mL/min (ref >60)
GFR calc non Af Amer: >60 mL/min (ref >60)
Glucose, Bld: 158 mg/dL — ABNORMAL HIGH (ref 65–99)
Potassium: 4 mmol/L (ref 3.5–5.1)
Sodium: 139 mmol/L (ref 135–145)
Total Bilirubin: 0.8 mg/dL (ref 0.3–1.2)
Total Protein: 6.6 g/dL (ref 6.5–8.1)

## 2016-07-09 LAB — CBC WITH DIFFERENTIAL/PLATELET
BASOS ABS: 0 10*3/uL (ref 0.0–0.1)
Basophils Relative: 1 %
EOS PCT: 5 %
Eosinophils Absolute: 0.3 10*3/uL (ref 0.0–0.7)
HCT: 43.1 % (ref 39.0–52.0)
HEMOGLOBIN: 14.4 g/dL (ref 13.0–17.0)
LYMPHS ABS: 2.3 10*3/uL (ref 0.7–4.0)
LYMPHS PCT: 40 %
MCH: 31 pg (ref 26.0–34.0)
MCHC: 33.4 g/dL (ref 30.0–36.0)
MCV: 92.7 fL (ref 78.0–100.0)
Monocytes Absolute: 0.6 10*3/uL (ref 0.1–1.0)
Monocytes Relative: 11 %
NEUTROS ABS: 2.5 10*3/uL (ref 1.7–7.7)
NEUTROS PCT: 43 %
PLATELETS: 170 10*3/uL (ref 150–400)
RBC: 4.65 MIL/uL (ref 4.22–5.81)
RDW: 13.1 % (ref 11.5–15.5)
WBC: 5.8 10*3/uL (ref 4.0–10.5)

## 2016-07-09 LAB — URINALYSIS, ROUTINE W REFLEX MICROSCOPIC
Bilirubin Urine: NEGATIVE
GLUCOSE, UA: NEGATIVE mg/dL
Hgb urine dipstick: NEGATIVE
Ketones, ur: NEGATIVE mg/dL
LEUKOCYTES UA: NEGATIVE
Nitrite: NEGATIVE
PH: 5 (ref 5.0–8.0)
Protein, ur: NEGATIVE mg/dL
Specific Gravity, Urine: 1.023 (ref 1.005–1.030)

## 2016-07-09 LAB — SURGICAL PCR SCREEN
MRSA, PCR: NEGATIVE
Staphylococcus aureus: NEGATIVE

## 2016-07-09 LAB — APTT: APTT: 34 s (ref 24–36)

## 2016-07-09 LAB — GLUCOSE, CAPILLARY: Glucose-Capillary: 160 mg/dL — ABNORMAL HIGH (ref 65–99)

## 2016-07-09 LAB — PROTIME-INR
INR: 1.24
PROTHROMBIN TIME: 15.7 s — AB (ref 11.4–15.2)

## 2016-07-09 IMAGING — CR DG CHEST 2V
2 series · 2 of 2 positions shown · non-contrast
Comparison: [DATE]

CLINICAL DATA: Pre-op respiratory exam.  Elective back surgery.

EXAM:
CHEST  2 VIEW

[w chest pa]
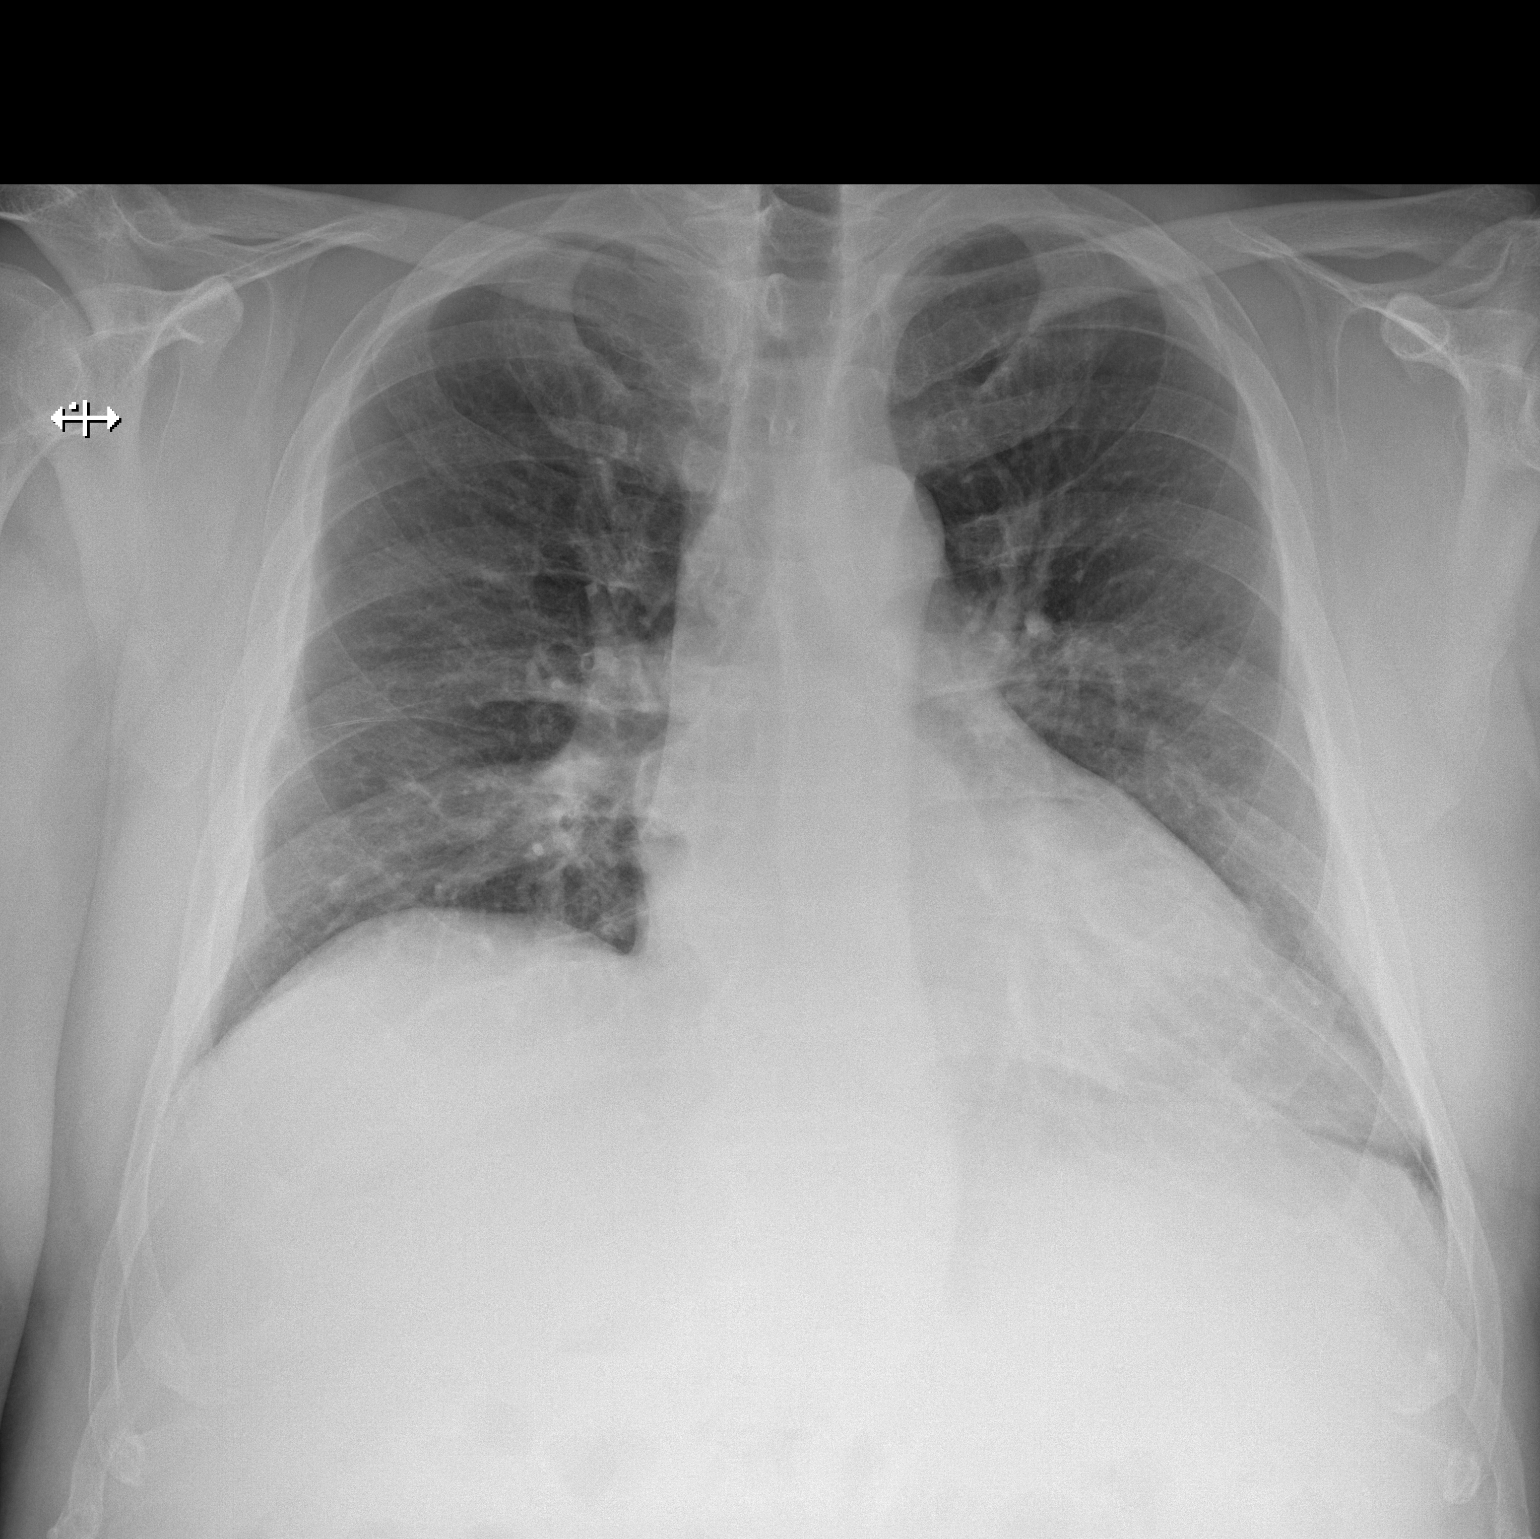

[w chest lat]
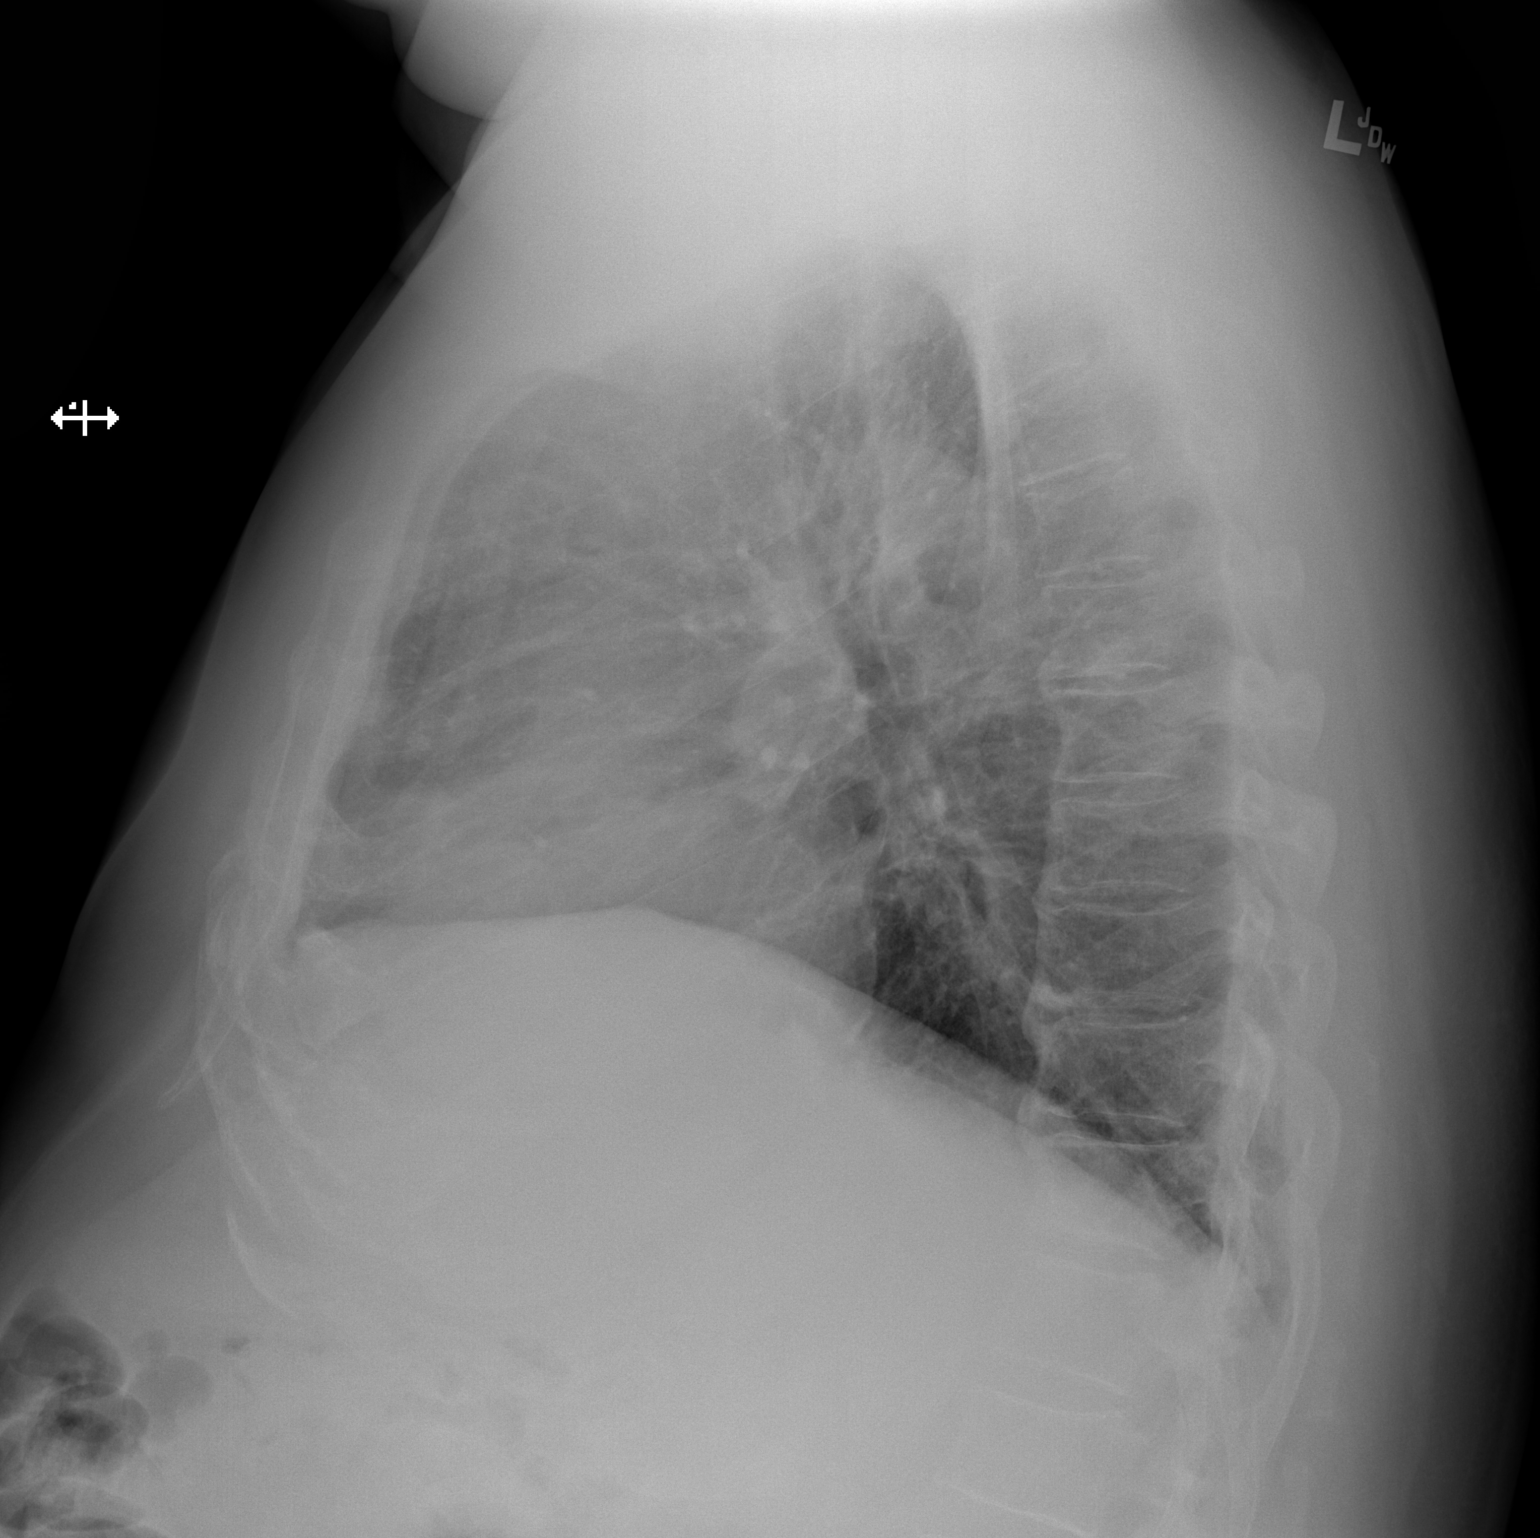

[2 of 2 positions shown; findings below may reference images not displayed]

FINDINGS: The heart size and mediastinal contours are within normal limits.
Both lungs are clear. The visualized skeletal structures are
unremarkable.
IMPRESSION: No active cardiopulmonary disease.

## 2016-07-09 NOTE — Pre-Procedure Instructions (Signed)
KI CORBO  07/09/2016      [facility] [place], [place] - [address] [place] [address] Phone: [phone] Fax: [phone]    Your procedure is scheduled on Thursday March 22.  Report to [facility] Admitting at 8:00 A.M.  Call this number if you have problems the morning of surgery:  [phone]   Remember:  Do not eat food or drink liquids after midnight.  Take these medicines the morning of surgery with A SIP OF WATER: hydrocodone (norco) if needed, methocarbamol (robaxin) if needed  7 days prior to surgery STOP taking any Aspirin, Aleve, Naproxen, Ibuprofen, Motrin, Advil, Goody's, BC's, all herbal medications, fish oil, and all vitamins  Follow MD's instructions on stopping Xarelto (Do not take Tuesday 07/09/16-Friday 07/11/16)     How to Manage Your Diabetes Before and After Surgery  Why is it important to control my blood sugar before and after surgery? . Improving blood sugar levels before and after surgery helps healing and can limit problems. . A way of improving blood sugar control is eating a healthy diet by: o  Eating less sugar and carbohydrates o  Increasing activity/exercise o  Talking with your doctor about reaching your blood sugar goals . High blood sugars (greater than 180 mg/dL) can raise your risk of infections and slow your recovery, so you will need to focus on controlling your diabetes during the weeks before surgery. . Make sure that the doctor who takes care of your diabetes knows about your planned surgery including the date and location.  How do I manage my blood sugar before surgery? . Check your blood sugar at least 4 times a day, starting 2 days before surgery, to make sure that the level is not too high or low. o Check your blood sugar the morning of your surgery when you wake up and every 2 hours until you get to the Short Stay unit. . If your blood sugar is less than 70 mg/dL, you will  need to treat for low blood sugar: o Do not take insulin. o Treat a low blood sugar (less than 70 mg/dL) with  cup of clear juice (cranberry or apple), 4 glucose tablets, OR glucose gel. o Recheck blood sugar in 15 minutes after treatment (to make sure it is greater than 70 mg/dL). If your blood sugar is not greater than 70 mg/dL on recheck, call [phone] for further instructions. . Report your blood sugar to the short stay nurse when you get to Short Stay.  . If you are admitted to the hospital after surgery: o Your blood sugar will be checked by the staff and you will probably be given insulin after surgery (instead of oral diabetes medicines) to make sure you have good blood sugar levels. o The goal for blood sugar control after surgery is 80-180 mg/dL.              WHAT DO I DO ABOUT MY DIABETES MEDICATION?   Marland Kitchen Do not take oral diabetes medicines (pills) the morning of surgery. Do not take sitagliptin (Januvia) or glimepiride (Amaryl) the day of surgery     Do not wear jewelry, make-up or nail polish.  Do not wear lotions, powders, or perfumes, or deoderant.  Do not shave 48 hours prior to surgery.  Men may shave face and neck.  Do not bring valuables to the hospital.  [facility] is not responsible for any belongings or valuables.  Contacts, dentures or bridgework may not be worn into surgery.  Leave your suitcase in the car.  After surgery it may be brought to your room.  For patients admitted to the hospital, discharge time will be determined by your treatment team.  Patients discharged the day of surgery will not be allowed to drive home.    Special instructions:    [facility]- Preparing For Surgery  Before surgery, you can play an important role. Because skin is not sterile, your skin needs to be as free of germs as possible. You can reduce the number of germs on your skin by washing with CHG (chlorahexidine gluconate) Soap before surgery.  CHG is an  antiseptic cleaner which kills germs and bonds with the skin to continue killing germs even after washing.  Please do not use if you have an allergy to CHG or antibacterial soaps. If your skin becomes reddened/irritated stop using the CHG.  Do not shave (including legs and underarms) for at least 48 hours prior to first CHG shower. It is OK to shave your face.  Please follow these instructions carefully.   1. Shower the NIGHT BEFORE SURGERY and the MORNING OF SURGERY with CHG.   2. If you chose to wash your hair, wash your hair first as usual with your normal shampoo.  3. After you shampoo, rinse your hair and body thoroughly to remove the shampoo.  4. Use CHG as you would any other liquid soap. You can apply CHG directly to the skin and wash gently with a scrungie or a clean washcloth.   5. Apply the CHG Soap to your body ONLY FROM THE NECK DOWN.  Do not use on open wounds or open sores. Avoid contact with your eyes, ears, mouth and genitals (private parts). Wash genitals (private parts) with your normal soap.  6. Wash thoroughly, paying special attention to the area where your surgery will be performed.  7. Thoroughly rinse your body with warm water from the neck down.  8. DO NOT shower/wash with your normal soap after using and rinsing off the CHG Soap.  9. Pat yourself dry with a CLEAN TOWEL.   10. Wear CLEAN PAJAMAS   11. Place CLEAN SHEETS on your bed the night of your first shower and DO NOT SLEEP WITH PETS.    Day of Surgery: Do not apply any deodorants/lotions. Please wear clean clothes to the hospital/surgery center.

## 2016-07-09 NOTE — Progress Notes (Signed)
PCP: Dr. Elsie Stain Pt denies cardiologist or cardiac hx  EKG: 07/09/16 CXR: 07/09/16 Stress test 2014 ECHO 2010  Pt denies with no complaints of chest pain, SOB or signs of infection at PAT appointment.  Pt diabetic. Hgb A1c last 8.9, pt states he doesn't check his blood sugar every day but when he does it is around 150. Chart forwarded to anesthesia for review.

## 2016-07-10 MED ORDER — DEXTROSE 5 % IV SOLN
3.0000 g | INTRAVENOUS | Status: AC
  Administered 2016-07-11: 3 g via INTRAVENOUS
  Filled 2016-07-10: qty 3000

## 2016-07-10 NOTE — Anesthesia Preprocedure Evaluation (Addendum)
Anesthesia Evaluation  Patient identified by MRN, date of birth, ID band Patient awake    Reviewed: Allergy & Precautions, H&P , NPO status , Patient's Chart, lab work & pertinent test results  Airway Mallampati: III  TM Distance: >3 FB Neck ROM: Full    Dental no notable dental hx. (+) Teeth Intact, Dental Advisory Given   Pulmonary neg pulmonary ROS, former smoker, PE   Pulmonary exam normal breath sounds clear to auscultation       Cardiovascular Exercise Tolerance: Good hypertension, On Medications + Peripheral Vascular Disease   Rhythm:Regular Rate:Normal     Neuro/Psych  Headaches, negative psych ROS   GI/Hepatic negative GI ROS, Neg liver ROS,   Endo/Other  diabetes, Type 2, Oral Hypoglycemic AgentsMorbid obesity  Renal/GU negative Renal ROS  negative genitourinary   Musculoskeletal   Abdominal   Peds  Hematology negative hematology ROS (+)   Anesthesia Other Findings   Reproductive/Obstetrics negative OB ROS                            Anesthesia Physical Anesthesia Plan  ASA: III  Anesthesia Plan: General   Post-op Pain Management:    Induction: Intravenous  Airway Management Planned: Oral ETT  Additional Equipment:   Intra-op Plan:   Post-operative Plan: Extubation in OR  Informed Consent: I have reviewed the patients History and Physical, chart, labs and discussed the procedure including the risks, benefits and alternatives for the proposed anesthesia with the patient or authorized representative who has indicated his/her understanding and acceptance.   Dental advisory given  Plan Discussed with: CRNA  Anesthesia Plan Comments:         Anesthesia Quick Evaluation

## 2016-07-10 NOTE — Progress Notes (Addendum)
Anesthesia chart review: Patient is a 61 year old male scheduled for L2 kyphoplasty on 07/11/2016 by Dr. Lynann Bologna.  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 BMI is consistent with morbid obesity.  PCP is Dr. Elsie Stain. He is aware of surgery plans and gave instructions on when to hold Xarelto for surgery.   Meds include Flexeril, Amaryl, lisinopril, fish oil, Xarelto (to hold after 07/08/16 dose), Januvia.  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 Tracing appears stable when compared to 02/26/13.  Nuclear stress test 03/08/13: Summary   1. Exercise myocardial perfusion study with no significant ischemia.  There  is a fixed inferior wall defect with normal wall motion likely due to  diaphragm attenuation.   2. Frequent PVCs at rest and in recovery.   3. No significant wall motion abnormality noted.   4. Overall, this is a Low risk scan.   5. The estimated ejection fraction is 55%.   6. The left ventricular global function was normal.   7. There are no EKG changes concerning for ischemia.   8. There is attenuation artifact noted on this study.   Echo 04/30/08 [facility], in the setting of PE): Summary: The study was technically difficult with many images been suboptimal in quality. Left ventricular systolic function is normal. Ejection fraction > 50%. There is mild concentric LVH. The right ventricular systolic function is normal. There is trace mitral regurgitation. There is trace tricuspid regurgitation.  CXR 07/09/16: IMPRESSION: No active cardiopulmonary disease.  Preoperative labs noted. Cr 0.93. Glucose 159. AST 50, ALT 52. CBC WNL. PT 15.7/INR 1.24. PTT 24. A1c on 06/27/16 was 8.9, consistent with average glucose of 209. He reports fasting CBG around 150.   He will get a fasting CBG on  arrival. If result acceptable and otherwise no acute changes then I would anticipate that he can proceed as planned.  Tim Hodge [facility] Short Stay Center/Anesthesiology Phone [phone] 07/10/2016 9:40 AM

## 2016-07-10 NOTE — H&P (Signed)
PREOPERATIVE H&P  Chief Complaint: Low back pain  HPI: Tim Hodge is a 61 y.o. male who presents with ongoing pain in the low back  MRI reveals edema throughout the L2 vertebral body consistent with a compression fracture  Patient has failed multiple forms of conservative care and continues to have pain (see office notes for additional details regarding the patient's full course of treatment)  Past Medical History:  Diagnosis Date  . Acute venous embolism and thrombosis of unspecified deep vessels of lower extremity   . Compression fx, lumbar spine ([place])    2017  . Elevated blood pressure reading without diagnosis of hypertension   . Headache   . Lumbago   . Polyp of colon   . Pulmonary embolism ([place])   . Type II or unspecified type diabetes mellitus without mention of complication, not stated as uncontrolled   . Unspecified arthropathy, ankle and foot    Past Surgical History:  Procedure Laterality Date  . FOOT SURGERY Left   . HAND SURGERY Right   . LAMINECTOMY  ~1998   Social History   Social History  . Marital status: Married    Spouse name: N/A  . Number of children: N/A  . Years of education: N/A   Social History Main Topics  . Smoking status: Former Smoker    Types: Cigarettes  . Smokeless tobacco: Never Used  . Alcohol use Yes     Comment: rare  . Drug use: No  . Sexual activity: Not on file   Other Topics Concern  . 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 Breast cancer Mother   . Alzheimer's disease Mother   . Breast cancer Sister   . Stroke Father   . Colon cancer Neg Hx   . Prostate cancer Neg Hx    Allergies  Allergen Reactions  . Iodinated Diagnostic Agents Shortness Of Breath and Rash  . Vioxx [Rofecoxib] Other (See Comments)    LFT elevation  . Metformin And Related Other (See Comments)    Muscle cramps   Prior to Admission  medications   Medication Sig Start Date End Date Taking? Authorizing Provider  cyclobenzaprine (FLEXERIL) 10 MG tablet Take 10 mg by mouth at bedtime.   Yes Historical Provider, MD  glimepiride (AMARYL) 4 MG tablet Take 2 tablets (8 mg total) by mouth daily with breakfast. 04/02/16  Yes Tonia Ghent, MD  HYDROcodone-acetaminophen (NORCO/VICODIN) 5-325 MG tablet Take 1-2 tablets by mouth every 6 (six) hours as needed for moderate pain. Patient taking differently: Take 1-2 tablets by mouth every 6 (six) hours as needed for moderate pain (depends on pain if takes 1-2 tablets).  12/11/15  Yes Lajean Saver, MD  lisinopril (PRINIVIL,ZESTRIL) 10 MG tablet Take 1 tablet (10 mg total) by mouth daily. 04/02/16  Yes Tonia Ghent, MD  methocarbamol (ROBAXIN) 500 MG tablet Take 500 mg by mouth 2 (two) times daily as needed for muscle spasms.   Yes Historical Provider, MD  Omega-3 Fatty Acids (FISH OIL) 1200 MG CAPS Take 1,200 mg by mouth daily.   Yes Historical Provider, MD  rivaroxaban (XARELTO) 20 MG TABS tablet TAKE ONE TABLET BY MOUTH ONCE DAILY WITH SUPPER Patient taking differently: Take 20 mg by mouth daily with supper.  04/02/16  Yes Tonia Ghent, MD  sitaGLIPtin (JANUVIA) 100  MG tablet Take 1 tablet (100 mg total) by mouth daily. 04/02/16  Yes Tonia Ghent, MD  cyclobenzaprine (FLEXERIL) 5 MG tablet 1 pill by mouth up to every 8 hours as needed. Start with one pill by mouth each bedtime as needed due to sedation Patient not taking: Reported on 07/05/2016 01/09/16   Wendie Agreste, MD     All other systems have been reviewed and were otherwise negative with the exception of those mentioned in the HPI and as above.  Physical Exam: There were no vitals filed for this visit.  General: Alert, no acute distress Cardiovascular: No pedal edema Respiratory: No cyanosis, no use of accessory musculature Skin: No lesions in the area of chief complaint Neurologic: Sensation intact  distally Psychiatric: Patient is competent for consent with normal mood and affect Lymphatic: No axillary or cervical lymphadenopathy  MUSCULOSKELETAL: + TTP low back  Assessment/Plan: Lumbar 2 kyphoplasty Plan for Procedure(s): LUMBAR 2 KYPHOPLASTY   Sinclair Ship, MD 07/10/2016 6:35 PM

## 2016-07-11 ENCOUNTER — Ambulatory Visit (HOSPITAL_COMMUNITY)
Admission: RE | Admit: 2016-07-11 | Discharge: 2016-07-11 | Disposition: A | Discharge status: Home / Self Care | Payer: Worker's Compensation | Source: Ambulatory Visit | Attending: Orthopedic Surgery | Admitting: Orthopedic Surgery

## 2016-07-11 ENCOUNTER — Encounter (HOSPITAL_COMMUNITY)
Admission: RE | Disposition: A | Discharge status: Home / Self Care | Payer: Self-pay | Source: Ambulatory Visit | Attending: Orthopedic Surgery

## 2016-07-11 ENCOUNTER — Ambulatory Visit (HOSPITAL_COMMUNITY): Payer: Worker's Compensation | Admitting: Anesthesiology

## 2016-07-11 ENCOUNTER — Encounter (HOSPITAL_COMMUNITY): Payer: Self-pay | Admitting: *Deleted

## 2016-07-11 ENCOUNTER — Ambulatory Visit (HOSPITAL_COMMUNITY): Payer: Worker's Compensation | Admitting: Vascular Surgery

## 2016-07-11 ENCOUNTER — Ambulatory Visit (HOSPITAL_COMMUNITY): Payer: Worker's Compensation

## 2016-07-11 DIAGNOSIS — Z86711 Personal history of pulmonary embolism: Secondary | ICD-10-CM | POA: Insufficient documentation

## 2016-07-11 DIAGNOSIS — Z6841 Body Mass Index (BMI) 40.0 and over, adult: Secondary | ICD-10-CM | POA: Insufficient documentation

## 2016-07-11 DIAGNOSIS — E1151 Type 2 diabetes mellitus with diabetic peripheral angiopathy without gangrene: Secondary | ICD-10-CM | POA: Diagnosis not present

## 2016-07-11 DIAGNOSIS — Z87891 Personal history of nicotine dependence: Secondary | ICD-10-CM | POA: Insufficient documentation

## 2016-07-11 DIAGNOSIS — S32029A Unspecified fracture of second lumbar vertebra, initial encounter for closed fracture: Secondary | ICD-10-CM | POA: Diagnosis not present

## 2016-07-11 DIAGNOSIS — Y990 Civilian activity done for income or pay: Secondary | ICD-10-CM | POA: Insufficient documentation

## 2016-07-11 DIAGNOSIS — I10 Essential (primary) hypertension: Secondary | ICD-10-CM | POA: Diagnosis not present

## 2016-07-11 DIAGNOSIS — Z86718 Personal history of other venous thrombosis and embolism: Secondary | ICD-10-CM | POA: Insufficient documentation

## 2016-07-11 DIAGNOSIS — Z419 Encounter for procedure for purposes other than remedying health state, unspecified: Secondary | ICD-10-CM

## 2016-07-11 DIAGNOSIS — Z7901 Long term (current) use of anticoagulants: Secondary | ICD-10-CM | POA: Insufficient documentation

## 2016-07-11 DIAGNOSIS — S32020G Wedge compression fracture of second lumbar vertebra, subsequent encounter for fracture with delayed healing: Secondary | ICD-10-CM

## 2016-07-11 DIAGNOSIS — Z79899 Other long term (current) drug therapy: Secondary | ICD-10-CM | POA: Insufficient documentation

## 2016-07-11 DIAGNOSIS — Z7984 Long term (current) use of oral hypoglycemic drugs: Secondary | ICD-10-CM | POA: Insufficient documentation

## 2016-07-11 DIAGNOSIS — X58XXXA Exposure to other specified factors, initial encounter: Secondary | ICD-10-CM | POA: Insufficient documentation

## 2016-07-11 HISTORY — PX: KYPHOPLASTY: SHX5884

## 2016-07-11 LAB — PROTIME-INR
INR: 1.03
PROTHROMBIN TIME: 13.5 s (ref 11.4–15.2)

## 2016-07-11 LAB — GLUCOSE, CAPILLARY
GLUCOSE-CAPILLARY: 154 mg/dL — AB (ref 65–99)
Glucose-Capillary: 130 mg/dL — ABNORMAL HIGH (ref 65–99)
Glucose-Capillary: 97 mg/dL (ref 65–99)

## 2016-07-11 IMAGING — RF DG LUMBAR SPINE 2-3V
1 series · 1 of 1 positions shown · non-contrast
Comparison: [DATE]

FLUOROSCOPY TIME:  3 minutes 36 seconds; 2 acquired images

CLINICAL DATA: Kyphoplasty L2

EXAM:
LUMBAR SPINE - 2-3 VIEW; DG C-ARM 61-120 MIN

[Series 1: run · 1 of 1 slices shown]
[im 1/1]
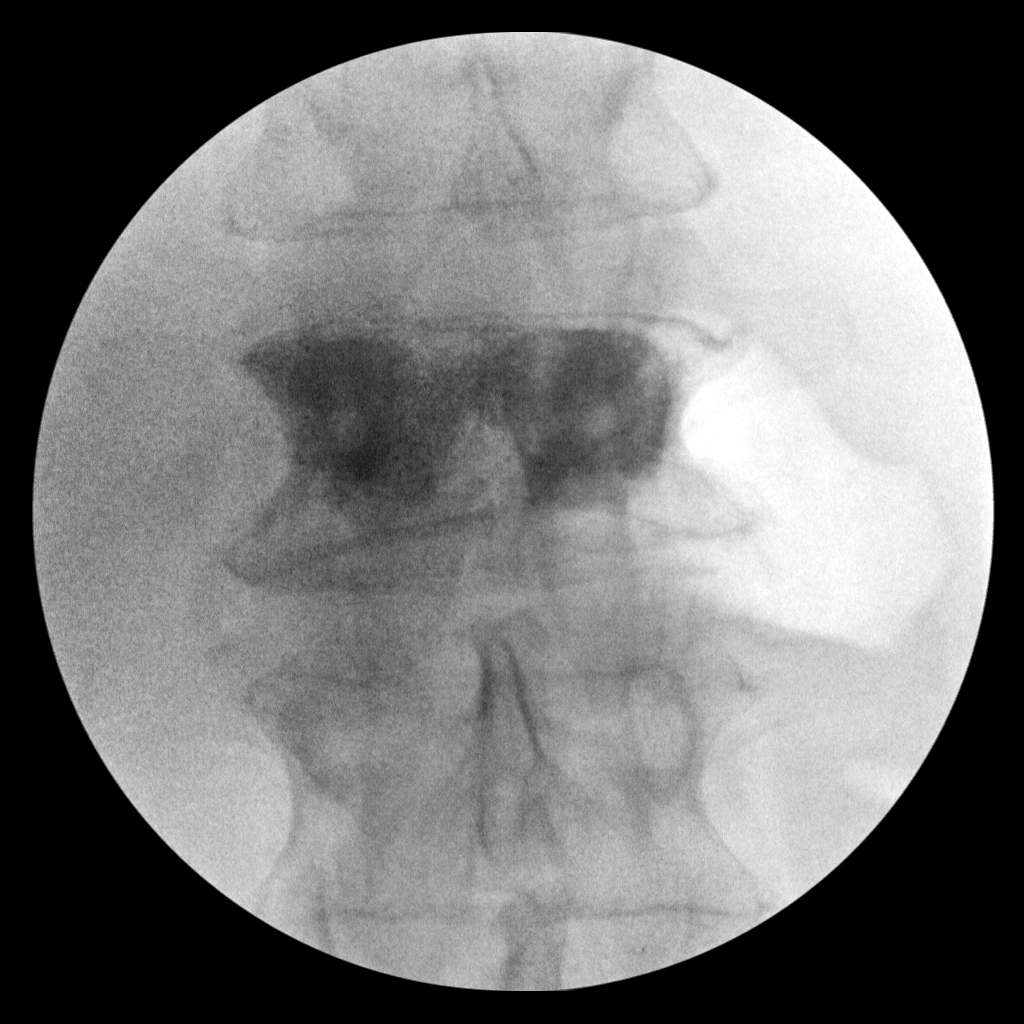

[1 of 1 positions shown; findings below may reference images not displayed]

FINDINGS: Frontal and lateral views obtained. The patient has undergone
kyphoplasty procedure at L2 without appreciable contrast
extravasation. Anterior wedging of the L2 vertebral body is noted.
No other fracture in the vicinity. No spondylolisthesis. There is
slight disc space narrowing at L2-3.
IMPRESSION: Status post kyphoplasty at L2 without contrast extravasation. Mild
disc space narrowing L2-3. No progression of anterior wedging. No
spondylolisthesis.

## 2016-07-11 SURGERY — KYPHOPLASTY
Anesthesia: General

## 2016-07-11 MED ORDER — ONDANSETRON HCL 4 MG/2ML IJ SOLN
INTRAMUSCULAR | Status: AC
  Filled 2016-07-11: qty 2

## 2016-07-11 MED ORDER — PHENYLEPHRINE HCL 10 MG/ML IJ SOLN
INTRAVENOUS | Status: DC | PRN
  Administered 2016-07-11: 25 ug/min via INTRAVENOUS

## 2016-07-11 MED ORDER — PHENYLEPHRINE HCL 10 MG/ML IJ SOLN
INTRAMUSCULAR | Status: DC | PRN
  Administered 2016-07-11 (×5): 80 ug via INTRAVENOUS

## 2016-07-11 MED ORDER — IOPAMIDOL (ISOVUE-300) INJECTION 61%
INTRAVENOUS | Status: DC | PRN
  Administered 2016-07-11: 20 mL

## 2016-07-11 MED ORDER — PHENYLEPHRINE 40 MCG/ML (10ML) SYRINGE FOR IV PUSH (FOR BLOOD PRESSURE SUPPORT)
PREFILLED_SYRINGE | INTRAVENOUS | Status: AC
  Filled 2016-07-11: qty 10

## 2016-07-11 MED ORDER — IOPAMIDOL (ISOVUE-300) INJECTION 61%
INTRAVENOUS | Status: AC
  Filled 2016-07-11: qty 50

## 2016-07-11 MED ORDER — ONDANSETRON HCL 4 MG/2ML IJ SOLN
INTRAMUSCULAR | Status: DC | PRN
  Administered 2016-07-11: 4 mg via INTRAVENOUS

## 2016-07-11 MED ORDER — ROCURONIUM BROMIDE 100 MG/10ML IV SOLN
INTRAVENOUS | Status: DC | PRN
  Administered 2016-07-11: 40 mg via INTRAVENOUS

## 2016-07-11 MED ORDER — PROPOFOL 10 MG/ML IV BOLUS
INTRAVENOUS | Status: AC
  Filled 2016-07-11: qty 20

## 2016-07-11 MED ORDER — DEXAMETHASONE SODIUM PHOSPHATE 10 MG/ML IJ SOLN
INTRAMUSCULAR | Status: AC
  Filled 2016-07-11: qty 1

## 2016-07-11 MED ORDER — SUGAMMADEX SODIUM 500 MG/5ML IV SOLN
INTRAVENOUS | Status: AC
  Filled 2016-07-11: qty 5

## 2016-07-11 MED ORDER — PROPOFOL 10 MG/ML IV BOLUS
INTRAVENOUS | Status: DC | PRN
  Administered 2016-07-11: 200 mg via INTRAVENOUS

## 2016-07-11 MED ORDER — LIDOCAINE HCL (CARDIAC) 20 MG/ML IV SOLN
INTRAVENOUS | Status: DC | PRN
  Administered 2016-07-11: 50 mg via INTRAVENOUS

## 2016-07-11 MED ORDER — EPINEPHRINE PF 1 MG/ML IJ SOLN
INTRAMUSCULAR | Status: AC
  Filled 2016-07-11: qty 1

## 2016-07-11 MED ORDER — OXYCODONE-ACETAMINOPHEN 5-325 MG PO TABS
ORAL_TABLET | ORAL | Status: AC
  Administered 2016-07-11: 2 via ORAL
  Filled 2016-07-11: qty 2

## 2016-07-11 MED ORDER — BACITRACIN 500 UNIT/GM EX OINT
TOPICAL_OINTMENT | CUTANEOUS | Status: DC | PRN
  Administered 2016-07-11: 1 via TOPICAL

## 2016-07-11 MED ORDER — OXYCODONE-ACETAMINOPHEN 5-325 MG PO TABS
1.0000 | ORAL_TABLET | Freq: Once | ORAL | Status: AC
  Administered 2016-07-11: 2 via ORAL

## 2016-07-11 MED ORDER — DEXAMETHASONE SODIUM PHOSPHATE 10 MG/ML IJ SOLN
INTRAMUSCULAR | Status: DC | PRN
  Administered 2016-07-11: 5 mg via INTRAVENOUS

## 2016-07-11 MED ORDER — BUPIVACAINE LIPOSOME 1.3 % IJ SUSP
INTRAMUSCULAR | Status: DC | PRN
  Administered 2016-07-11: 5 mL

## 2016-07-11 MED ORDER — LIDOCAINE 2% (20 MG/ML) 5 ML SYRINGE
INTRAMUSCULAR | Status: AC
  Filled 2016-07-11: qty 5

## 2016-07-11 MED ORDER — MIDAZOLAM HCL 2 MG/2ML IJ SOLN
INTRAMUSCULAR | Status: AC
  Filled 2016-07-11: qty 2

## 2016-07-11 MED ORDER — SUGAMMADEX SODIUM 500 MG/5ML IV SOLN
INTRAVENOUS | Status: DC | PRN
  Administered 2016-07-11: 300 mg via INTRAVENOUS

## 2016-07-11 MED ORDER — BUPIVACAINE LIPOSOME 1.3 % IJ SUSP
20.0000 mL | INTRAMUSCULAR | Status: AC
  Filled 2016-07-11: qty 20

## 2016-07-11 MED ORDER — FENTANYL CITRATE (PF) 100 MCG/2ML IJ SOLN
INTRAMUSCULAR | Status: AC
  Filled 2016-07-11: qty 4

## 2016-07-11 MED ORDER — SUCCINYLCHOLINE CHLORIDE 200 MG/10ML IV SOSY
PREFILLED_SYRINGE | INTRAVENOUS | Status: AC
  Filled 2016-07-11: qty 10

## 2016-07-11 MED ORDER — ROCURONIUM BROMIDE 50 MG/5ML IV SOSY
PREFILLED_SYRINGE | INTRAVENOUS | Status: AC
  Filled 2016-07-11: qty 5

## 2016-07-11 MED ORDER — LACTATED RINGERS IV SOLN
INTRAVENOUS | Status: DC
  Administered 2016-07-11 (×3): via INTRAVENOUS

## 2016-07-11 MED ORDER — BUPIVACAINE-EPINEPHRINE 0.25% -1:200000 IJ SOLN
INTRAMUSCULAR | Status: DC | PRN
  Administered 2016-07-11: 5 mL

## 2016-07-11 MED ORDER — HYDROMORPHONE HCL 1 MG/ML IJ SOLN
0.2500 mg | INTRAMUSCULAR | Status: DC | PRN
  Administered 2016-07-11: 0.5 mg via INTRAVENOUS

## 2016-07-11 MED ORDER — HYDROMORPHONE HCL 1 MG/ML IJ SOLN
INTRAMUSCULAR | Status: AC
  Administered 2016-07-11: 0.5 mg via INTRAVENOUS
  Filled 2016-07-11: qty 0.5

## 2016-07-11 MED ORDER — DIAZEPAM 5 MG PO TABS
ORAL_TABLET | ORAL | Status: AC
  Administered 2016-07-11: 5 mg via ORAL
  Filled 2016-07-11: qty 1

## 2016-07-11 MED ORDER — DIAZEPAM 5 MG PO TABS
5.0000 mg | ORAL_TABLET | Freq: Once | ORAL | Status: AC
  Administered 2016-07-11: 5 mg via ORAL

## 2016-07-11 MED ORDER — BUPIVACAINE HCL (PF) 0.25 % IJ SOLN
INTRAMUSCULAR | Status: AC
  Filled 2016-07-11: qty 30

## 2016-07-11 MED ORDER — FENTANYL CITRATE (PF) 100 MCG/2ML IJ SOLN
INTRAMUSCULAR | Status: DC | PRN
  Administered 2016-07-11: 100 ug via INTRAVENOUS

## 2016-07-11 MED ORDER — POVIDONE-IODINE 7.5 % EX SOLN
Freq: Once | CUTANEOUS | Status: DC
  Filled 2016-07-11: qty 118

## 2016-07-11 SURGICAL SUPPLY — 43 items
BLADE SURG 15 STRL LF DISP TIS (BLADE) ×1 IMPLANT
BLADE SURG 15 STRL SS (BLADE) ×2
CEMENT BONE KYPHX HV R (Orthopedic Implant) ×3 IMPLANT
CEMENT KYPHON C01A KIT/MIXER (Cement) ×3 IMPLANT
COVER MAYO STAND STRL (DRAPES) ×3 IMPLANT
COVER SURGICAL LIGHT HANDLE (MISCELLANEOUS) ×3 IMPLANT
CURETTE EXPRESS SZ2 7MM (INSTRUMENTS) ×1 IMPLANT
CURRETTE EXPRESS SZ2 7MM (INSTRUMENTS) ×3
DRAPE C-ARM 42X72 X-RAY (DRAPES) ×3 IMPLANT
DRAPE INCISE IOBAN 66X45 STRL (DRAPES) ×3 IMPLANT
DRAPE LAPAROTOMY T 102X78X121 (DRAPES) ×3 IMPLANT
DRAPE PROXIMA HALF (DRAPES) IMPLANT
DRAPE SURG 17X23 STRL (DRAPES) ×12 IMPLANT
DURAPREP 26ML APPLICATOR (WOUND CARE) ×3 IMPLANT
GAUZE SPONGE 2X2 8PLY STRL LF (GAUZE/BANDAGES/DRESSINGS) ×1 IMPLANT
GAUZE SPONGE 4X4 16PLY XRAY LF (GAUZE/BANDAGES/DRESSINGS) ×3 IMPLANT
GLOVE BIO SURGEON STRL SZ7 (GLOVE) ×3 IMPLANT
GLOVE BIO SURGEON STRL SZ8 (GLOVE) ×3 IMPLANT
GLOVE BIOGEL PI IND STRL 7.0 (GLOVE) ×1 IMPLANT
GLOVE BIOGEL PI IND STRL 8 (GLOVE) ×1 IMPLANT
GLOVE BIOGEL PI INDICATOR 7.0 (GLOVE) ×2
GLOVE BIOGEL PI INDICATOR 8 (GLOVE) ×2
GOWN STRL REUS W/ TWL LRG LVL3 (GOWN DISPOSABLE) ×2 IMPLANT
GOWN STRL REUS W/ TWL XL LVL3 (GOWN DISPOSABLE) ×1 IMPLANT
GOWN STRL REUS W/TWL LRG LVL3 (GOWN DISPOSABLE) ×4
GOWN STRL REUS W/TWL XL LVL3 (GOWN DISPOSABLE) ×2
KIT BASIN OR (CUSTOM PROCEDURE TRAY) ×3 IMPLANT
KIT ROOM TURNOVER OR (KITS) ×3 IMPLANT
NEEDLE 22X1 1/2 (OR ONLY) (NEEDLE) IMPLANT
NEEDLE HYPO 25X1 1.5 SAFETY (NEEDLE) IMPLANT
NEEDLE SPNL 18GX3.5 QUINCKE PK (NEEDLE) ×6 IMPLANT
NS IRRIG 1000ML POUR BTL (IV SOLUTION) ×3 IMPLANT
PACK UNIVERSAL I (CUSTOM PROCEDURE TRAY) ×3 IMPLANT
PAD ARMBOARD 7.5X6 YLW CONV (MISCELLANEOUS) ×6 IMPLANT
POSITIONER HEAD PRONE TRACH (MISCELLANEOUS) ×3 IMPLANT
SPONGE GAUZE 2X2 STER 10/PKG (GAUZE/BANDAGES/DRESSINGS) ×2
SUT MNCRL AB 4-0 PS2 18 (SUTURE) ×3 IMPLANT
SYR BULB IRRIGATION 50ML (SYRINGE) ×3 IMPLANT
SYR CONTROL 10ML LL (SYRINGE) ×3 IMPLANT
TAPE CLOTH SURG 4X10 WHT LF (GAUZE/BANDAGES/DRESSINGS) ×3 IMPLANT
TOWEL OR 17X24 6PK STRL BLUE (TOWEL DISPOSABLE) ×3 IMPLANT
TOWEL OR 17X26 10 PK STRL BLUE (TOWEL DISPOSABLE) ×3 IMPLANT
TRAY KYPHOPAK 15/3 ONESTEP 1ST (MISCELLANEOUS) ×3 IMPLANT

## 2016-07-11 NOTE — Transfer of Care (Signed)
Immediate Anesthesia Transfer of Care Note  Patient: Tim Hodge  Procedure(s) Performed: Procedure(s) with comments: LUMBAR 2 KYPHOPLASTY (N/A) - LUMBAR 2 KYPHOPLASTY  Patient Location: PACU  Anesthesia Type:General  Level of Consciousness: awake and alert   Airway & Oxygen Therapy: Patient Spontanous Breathing and Patient connected to face mask oxygen  Post-op Assessment: Report given to RN, Post -op Vital signs reviewed and stable and Patient moving all extremities X 4  Post vital signs: Reviewed and stable  Last Vitals:  Vitals:   07/11/16 0818  BP: 137/81  Pulse: 78  Resp: 18  Temp: 36.7 C    Last Pain:  Vitals:   07/11/16 0818  TempSrc: Oral  PainSc:       Patients Stated Pain Goal: 5 (41/96/22 [id number])  Complications: No apparent anesthesia complications

## 2016-07-11 NOTE — Anesthesia Postprocedure Evaluation (Signed)
Anesthesia Post Note  Patient: Tim Hodge  Procedure(s) Performed: Procedure(s) (LRB): LUMBAR 2 KYPHOPLASTY (N/A)  Patient location during evaluation: PACU Anesthesia Type: General Level of consciousness: awake and alert Pain management: pain level controlled Vital Signs Assessment: post-procedure vital signs reviewed and stable Respiratory status: spontaneous breathing, nonlabored ventilation, respiratory function stable and patient connected to nasal cannula oxygen Cardiovascular status: blood pressure returned to baseline and stable Postop Assessment: no signs of nausea or vomiting Anesthetic complications: no       Last Vitals:  Vitals:   07/11/16 1330 07/11/16 1345  BP: 130/86 (!) 143/82  Pulse: 81 (!) 58  Resp: 17   Temp:      Last Pain:  Vitals:   07/11/16 1315  TempSrc:   PainSc: 0-No pain                 Hyden Soley,W. EDMOND

## 2016-07-11 NOTE — Discharge Instructions (Signed)
-   Restart Xarelto on Sunday 07/14/16 normal schedule

## 2016-07-11 NOTE — Anesthesia Procedure Notes (Signed)
Procedure Name: Intubation Date/Time: 07/11/2016 11:39 AM Performed by: Rejeana Brock L Pre-anesthesia Checklist: Patient identified, Emergency Drugs available, Suction available and Patient being monitored Patient Re-evaluated:Patient Re-evaluated prior to inductionOxygen Delivery Method: Circle System Utilized Preoxygenation: Pre-oxygenation with 100% oxygen Intubation Type: IV induction Ventilation: Mask ventilation without difficulty Laryngoscope Size: Mac and 4 Grade View: Grade III Tube type: Oral Number of attempts: 1 Airway Equipment and Method: Stylet and Oral airway Placement Confirmation: ETT inserted through vocal cords under direct vision,  positive ETCO2 and breath sounds checked- equal and bilateral Secured at: 22 cm Tube secured with: Tape Dental Injury: Teeth and Oropharynx as per pre-operative assessment  Difficulty Due To: Difficult Airway- due to anterior larynx Future Recommendations: Recommend- induction with short-acting agent, and alternative techniques readily available

## 2016-07-12 ENCOUNTER — Encounter (HOSPITAL_COMMUNITY): Payer: Self-pay | Admitting: Orthopedic Surgery

## 2016-07-12 NOTE — Op Note (Signed)
NAME:  Tim Hodge, Tim Hodge                    ACCOUNT NO.:  MEDICAL RECORD NO.:  [id number]  LOCATION:                                 FACILITY:  PHYSICIAN:  Phylliss Bob, MD           DATE OF BIRTH:  DATE OF PROCEDURE:  07/11/2016                              OPERATIVE REPORT   PREOPERATIVE DIAGNOSIS:  L2 compression fracture.  POSTOPERATIVE DIAGNOSIS:  L2 compression fracture.  PROCEDURE:  L2 kyphoplasty.  SURGEON:  Phylliss Bob, MD.  ASSISTANTPricilla Holm, PA-C.  ANESTHESIA:  General endotracheal anesthesia.  COMPLICATIONS:  None.  DISPOSITION:  Stable.  ESTIMATED BLOOD LOSS:  Minimal.  INDICATIONS FOR SURGERY:  Briefly, Tim Hodge is a pleasant 61 year old male, who did present to me with pain in his low back.  Of note, he is status post a work injury that did occur on December 11, 2015.  He did have ongoing pain.  We did proceed with an MRI of his lumbar spine, which did reveal ongoing edema involving the L2 vertebral body.  This was not expected, as he was far enough out from his injury, to where it would be expected that his fracture would have healed; however, it clearly had not, and he did continue to have pain, so we did discuss proceeding with the procedure reflected above.  He was fully aware of the risks and limitations and did elect to proceed.  OPERATIVE DETAILS:  On July 11, 2016, the patient was brought to surgery and general endotracheal anesthesia was administered.  The patient was placed prone on a well-padded flat Jackson bed.  Gel rolls were placed under the patient's chest and hips.  Antibiotics were given and a time-out procedure was performed.  AP and lateral fluoroscopy was brought into the field.  Using AP and lateral fluoroscopy, I did cannulate the bilateral L2 pedicles.  I then used a drill and a curette through the Jamshidi and I did insert kyphoplasty balloons.  Each balloon was inflated with a total of approximately 3 mL of contrast.   Of note, the bone was noted to be rather sclerotic, which was expected given the duration of time since his injury.  I then introduced approximately 4 mL of cement through each cannula on the right and left sides.  Excellent interdigitation of cement was noted into the L2 vertebral body.  There was no abnormal extravasation noted.  The cement was then allowed to harden.  The Jamshidi was then removed.  The wound was then copiously irrigated.  The wounds were then closed using 4-0 Monocryl. Benzoin and Steri-Strips were applied, followed by sterile dressing. All instrument counts were correct.     Phylliss Bob, MD     MD/MEDQ  D:  07/11/2016  T:  07/11/2016  Job:  [id number]

## 2016-09-24 ENCOUNTER — Telehealth: Payer: Self-pay | Admitting: Family Medicine

## 2016-09-24 NOTE — Telephone Encounter (Signed)
Pt left message on Triage phone, he is having injections in back on 09/26/16 and Dr. Lynann Bologna would like to have him off blood thinners.  Please advise.  Best number to call is [phone]

## 2016-09-25 NOTE — Telephone Encounter (Signed)
Left message on voicemail for patient to call back. 

## 2016-09-25 NOTE — Telephone Encounter (Signed)
What injection is scheduled? We didn't get input from ortho earlier about this injection and his blood thinner.  We did talk about his kyphoplasty earlier this year, but I think that was/is a different issue.  I don't know about timing off med until I have details re: the procedure.   Please let me know.  Thanks.

## 2016-09-25 NOTE — Telephone Encounter (Signed)
Spoke with patient.  He states that the doctor performing the injections tomorrow told him to go off of his Xarelto yesterday and restart it after the procedure tomorrow.  Patient described the procedure as "some kind of multiple injections in my back under some light anesthesia".  He was just told to call and let you know.

## 2016-09-26 NOTE — Telephone Encounter (Signed)
Noted, I'll defer.  Thanks.

## 2016-12-11 ENCOUNTER — Telehealth: Payer: Self-pay

## 2016-12-11 NOTE — Telephone Encounter (Signed)
Pt walked in requesting a refill of Hydrocodone. Pt has been released from his surgeon and has been out of hydrocodone for a few days. He was asking for a refill. I called and advised him that he may have to discuss this at this OV next week due to the [place] Stop Act.

## 2016-12-12 ENCOUNTER — Other Ambulatory Visit: Payer: Self-pay | Admitting: Family Medicine

## 2016-12-12 ENCOUNTER — Other Ambulatory Visit (INDEPENDENT_AMBULATORY_CARE_PROVIDER_SITE_OTHER): Payer: BC Managed Care – PPO

## 2016-12-12 DIAGNOSIS — E119 Type 2 diabetes mellitus without complications: Secondary | ICD-10-CM | POA: Diagnosis not present

## 2016-12-12 LAB — COMPREHENSIVE METABOLIC PANEL
ALT: 50 U/L (ref 0–53)
AST: 45 U/L — AB (ref 0–37)
Albumin: 3.9 g/dL (ref 3.5–5.2)
Alkaline Phosphatase: 134 U/L — ABNORMAL HIGH (ref 39–117)
BILIRUBIN TOTAL: 1.1 mg/dL (ref 0.2–1.2)
BUN: 14 mg/dL (ref 6–23)
CO2: 27 mEq/L (ref 19–32)
Calcium: 9.4 mg/dL (ref 8.4–10.5)
Chloride: 104 mEq/L (ref 96–112)
Creatinine, Ser: 0.92 mg/dL (ref 0.40–1.50)
GFR: 89.03 mL/min (ref >60.00)
GLUCOSE: 253 mg/dL — AB (ref 70–99)
POTASSIUM: 4.1 meq/L (ref 3.5–5.1)
Sodium: 137 mEq/L (ref 135–145)
Total Protein: 6.9 g/dL (ref 6.0–8.3)

## 2016-12-12 LAB — LIPID PANEL
CHOLESTEROL: 201 mg/dL — AB (ref 0–200)
HDL: 39.2 mg/dL (ref >39.00)
LDL Cholesterol: 139 mg/dL — ABNORMAL HIGH (ref 0–99)
NONHDL: 161.89
Total CHOL/HDL Ratio: 5
Triglycerides: 112 mg/dL (ref 0.0–149.0)
VLDL: 22.4 mg/dL (ref 0.0–40.0)

## 2016-12-12 LAB — HEMOGLOBIN A1C: HEMOGLOBIN A1C: 7.7 % — AB (ref 4.6–6.5)

## 2016-12-12 NOTE — Telephone Encounter (Signed)
Left detailed message on voicemail of preferred #.  No answer or VM on cell phone.

## 2016-12-12 NOTE — Telephone Encounter (Signed)
He needs to contact the surgeon if still in pain- 1.  To update the surgeon about his pain and 2. To see if that is going to change his plan/management.  Thanks.

## 2016-12-13 NOTE — Telephone Encounter (Signed)
Left another detailed message on phone number given at time of call.  No answer at home number.

## 2016-12-17 ENCOUNTER — Ambulatory Visit (INDEPENDENT_AMBULATORY_CARE_PROVIDER_SITE_OTHER): Payer: BC Managed Care – PPO | Admitting: Family Medicine

## 2016-12-17 ENCOUNTER — Encounter: Payer: Self-pay | Admitting: Family Medicine

## 2016-12-17 VITALS — BP 130/94 | HR 80 | Temp 98.0°F | Wt 303.5 lb

## 2016-12-17 DIAGNOSIS — G8929 Other chronic pain: Secondary | ICD-10-CM

## 2016-12-17 DIAGNOSIS — E119 Type 2 diabetes mellitus without complications: Secondary | ICD-10-CM

## 2016-12-17 DIAGNOSIS — M549 Dorsalgia, unspecified: Secondary | ICD-10-CM

## 2016-12-17 MED ORDER — CYCLOBENZAPRINE HCL 10 MG PO TABS: 5.0000 mg | ORAL_TABLET | Freq: Every evening | ORAL | 2 refills | Status: DC | PRN

## 2016-12-17 NOTE — Progress Notes (Signed)
Back pain s/p back injection after kyphoplasty w/o relief of pain.  He was prev seen by Dr. Lynann Bologna.  4-5/10 pain.  Worse if moving around more or prolonged sitting.  He is trying to walk some to see if that will help.  Lower midline back pain that radiates into the B hip areas.  No radicular pain at this moment but still with some numbness in the R lateral thigh.  S/p PT tx.  He had to hire an attorney in the meantime re: workers comp.  Per patient, ortho didn't refill is pain meds, he weaned off in the meantime.  He is putting up with the pain but not easily.  He is trying to get set up with a pain clinic.   He tried taking flexeril and robaxin in the past.  Flexeril helped him sleep at night.   Diabetes:  Using medications without difficulties: yes Hypoglycemic episodes: no Hyperglycemic episodes: no Feet problems: at baseline with tingling in the L foot since prev injury Blood Sugars averaging: usually higher in the AMs, >200, then lower later in the day eye exam within last year: due, d/w pt.   A1c improved.  D/w pt.   Lipids similar to prev.  Defer tx at this point given the ongoing back pain.  I don't want to confuse the issue with statin induced pain.    Meds, vitals, and allergies reviewed.   ROS: Per HPI unless specifically indicated in ROS section   GEN: nad, alert and oriented CV: rrr. PULM: ctab, no inc wob ABD: soft, +bs EXT: no edema SKIN: no acute rash Lower back ttp in the B paraspinal muscles Walking with limp due to back pain.  Diabetic foot exam: Normal inspection No skin breakdown No calluses  Normal DP pulses Normal sensation to light touch and monofilament but L foot overly sensitive to light touch per patient report.  Old scar noted on L foot Nails normal

## 2016-12-17 NOTE — Patient Instructions (Signed)
I'll ask for extra info from Dr. Lynann Bologna.  Use flexeril at night, sedation caution.  Recheck in about 4 months, labs ahead of time.  Take care.  Glad to see you.

## 2016-12-18 NOTE — Assessment & Plan Note (Signed)
Restart Flexeril. I will request the records from orthopedics. We will go from there. He is trying to get set up a pain clinic.

## 2016-12-18 NOTE — Assessment & Plan Note (Signed)
A1c improved.  D/w pt.   Lipids similar to prev.  Defer tx at this point given the ongoing back pain.  I don't want to confuse the issue with statin induced pain.   D/w pt.  He agrees.   See AVS.  Recheck in about 4 months, labs ahead of time.

## 2016-12-19 ENCOUNTER — Telehealth: Payer: Self-pay | Admitting: Family Medicine

## 2016-12-19 DIAGNOSIS — E119 Type 2 diabetes mellitus without complications: Secondary | ICD-10-CM

## 2016-12-19 NOTE — Telephone Encounter (Signed)
Patient advised.   Patient is scheduled for labs in December.  Please order when you can.

## 2016-12-19 NOTE — Telephone Encounter (Signed)
Notify pt.  I reviewed the notes from the ortho clinic.  I think that pain clinic eval is likely the best option at this point, for his pain control and treatment long term.  Have him let us know if we can help with that.  His worker's comp situation may dictate that it comes through another clinic/provider, ie through worker's comp.  Thanks.    For charting purposes- I hadn't been getting records from the ortho clinic.  I am not listed as a CC on all of the notes.  This may have been due to his worker's comp situation.  Either way, I wasn't notified that the ortho clinic was stopping his pain meds and relying on me to fill these meds.  I wasn't consulted on this along the way.

## 2016-12-20 NOTE — Telephone Encounter (Signed)
Ordered lipid and A1c.  Thanks.

## 2017-02-18 ENCOUNTER — Telehealth: Payer: Self-pay | Admitting: Family Medicine

## 2017-02-18 NOTE — Telephone Encounter (Signed)
Placed in Dr. Josefine Class In box.

## 2017-02-18 NOTE — Telephone Encounter (Signed)
New Message  Pt dropped off disc for his back and need it read prior to his appt on Friday, 11.2.18.

## 2017-02-19 NOTE — Telephone Encounter (Signed)
I'll try to get the images to load.  Will d/w pt at [facility]. Thanks.

## 2017-02-21 ENCOUNTER — Ambulatory Visit (INDEPENDENT_AMBULATORY_CARE_PROVIDER_SITE_OTHER): Payer: BC Managed Care – PPO | Admitting: Family Medicine

## 2017-02-21 ENCOUNTER — Encounter: Payer: Self-pay | Admitting: Family Medicine

## 2017-02-21 ENCOUNTER — Ambulatory Visit: Payer: Self-pay

## 2017-02-21 VITALS — BP 122/70 | HR 78 | Temp 98.0°F | Wt 303.5 lb

## 2017-02-21 DIAGNOSIS — R9389 Abnormal findings on diagnostic imaging of other specified body structures: Secondary | ICD-10-CM

## 2017-02-21 DIAGNOSIS — G8929 Other chronic pain: Secondary | ICD-10-CM | POA: Diagnosis not present

## 2017-02-21 DIAGNOSIS — M549 Dorsalgia, unspecified: Secondary | ICD-10-CM | POA: Diagnosis not present

## 2017-02-21 DIAGNOSIS — Z23 Encounter for immunization: Secondary | ICD-10-CM | POA: Diagnosis not present

## 2017-02-21 MED ORDER — TRAMADOL HCL 50 MG PO TABS: 50.0000 mg | ORAL_TABLET | Freq: Three times a day (TID) | ORAL | 1 refills | Status: DC | PRN

## 2017-02-21 NOTE — Progress Notes (Signed)
Very brief summary.   He had f/u MRI done via outside clinic, results d/w pt.   He was "cut loose" by Dr. Laurena Bering clinic.   He was retired by work.   He is still in sig back pain.  He has been off pain meds in the meantime.   Still on anticoagulation.    Still with pain in the AM, stiffness in the back.  No new trauma.  Still getting up at 3 AM due to pain, has to get moving/respositioned for the pain.  He still has some pain radiating down into the L leg, worse with certain movements.  occ walking with a cane due to pain.   Flexeril wasn't helping much, he was sedated with med and didn't feel well in general when on med.  He hasn't tried tramadol.  Hasn't tried cymbalta or gabapentin yet.   Recent UDS neg at outside clinic, reviewed.    Also with possible abnormal finding on MRI re: kidney, d/w pt.    PMH and SH reviewed  ROS: Per HPI unless specifically indicated in ROS section   Meds, vitals, and allergies reviewed.   GEN: nad, alert and oriented HEENT: mucous membranes moist NECK: supple w/o LA CV: rrr. PULM: ctab, no inc wob ABD: soft, +bs EXT: no edema SKIN: no acute rash Back with midline lower back pain w/o CVA pain.  S/S wnl on BLE at time of exam but walking with limp from pain.

## 2017-02-21 NOTE — Patient Instructions (Addendum)
Drop off your papers and I'll check them.  I'll try to fill them out.   Tim Hodge will call about your ultrasound.  Try tramadol in the meantime and update me as needed.  Take care.  Glad to see you.

## 2017-02-24 NOTE — Assessment & Plan Note (Addendum)
He had f/u MRI done via outside clinic, results d/w pt.   He was "cut loose" by Dr. Laurena Bering clinic.   He was retired by work.   He is still in sig back pain.  He has been off pain meds in the meantime.   Still on anticoagulation.    Still with pain in the AM, stiffness in the back.  No new trauma.  Still getting up at 3 AM due to pain, has to get moving/respositioned for the pain.  He still has some pain radiating down into the L leg, worse with certain movements.  occ walking with a cane due to pain.   Flexeril wasn't helping much, he was sedated with med and didn't feel well in general when on med.  He hasn't tried tramadol.  Hasn't tried cymbalta or gabapentin yet.   Recent UDS neg at outside clinic, reviewed.    ======================================= Would start tramadol with routine cautions.  He may need gabapentin vs cymbalta trial.  D/w pt about all 3 meds.  He agrees.  Okay for outpatient f/u.  He has paper work for insurance/etc and I'll be glad to look at it to see if I can fill it out.  D/w pt.  He'll drop it off.    Possible renal abnormality on MRI, will get u/s done.  He agrees.  >25 minutes spent in face to face time with patient, >50% spent in counselling or coordination of care.

## 2017-02-28 ENCOUNTER — Ambulatory Visit
Admission: RE | Admit: 2017-02-28 | Discharge: 2017-02-28 | Disposition: A | Discharge status: Home / Self Care | Payer: BC Managed Care – PPO | Source: Ambulatory Visit | Attending: Family Medicine | Admitting: Family Medicine

## 2017-02-28 DIAGNOSIS — R93422 Abnormal radiologic findings on diagnostic imaging of left kidney: Secondary | ICD-10-CM | POA: Diagnosis not present

## 2017-02-28 DIAGNOSIS — N281 Cyst of kidney, acquired: Secondary | ICD-10-CM | POA: Diagnosis not present

## 2017-02-28 DIAGNOSIS — R9389 Abnormal findings on diagnostic imaging of other specified body structures: Secondary | ICD-10-CM | POA: Diagnosis present

## 2017-03-31 ENCOUNTER — Other Ambulatory Visit: Payer: Self-pay

## 2017-04-06 ENCOUNTER — Other Ambulatory Visit: Payer: Self-pay | Admitting: Family Medicine

## 2017-04-07 ENCOUNTER — Ambulatory Visit: Payer: Self-pay | Admitting: Family Medicine

## 2017-04-08 ENCOUNTER — Encounter: Payer: Self-pay | Admitting: Family Medicine

## 2017-04-08 ENCOUNTER — Ambulatory Visit: Payer: BC Managed Care – PPO | Admitting: Family Medicine

## 2017-04-08 DIAGNOSIS — M549 Dorsalgia, unspecified: Secondary | ICD-10-CM

## 2017-04-08 DIAGNOSIS — E119 Type 2 diabetes mellitus without complications: Secondary | ICD-10-CM

## 2017-04-08 DIAGNOSIS — G8929 Other chronic pain: Secondary | ICD-10-CM | POA: Diagnosis not present

## 2017-04-08 MED ORDER — GABAPENTIN 100 MG PO CAPS: 100.0000 mg | ORAL_CAPSULE | Freq: Three times a day (TID) | ORAL | 3 refills | Status: DC | PRN

## 2017-04-08 NOTE — Progress Notes (Signed)
He slipped in the snow and twisted his L knee.  Clearly better in the meantime, wearing a wrap in the meantime with sig relief.  Bearing weight, no locking but was prev sore medially.    Chronic back and leg pain. See prev OV notes, reviewed with patient.  Tramadol didn't help his pain.  D/w pt.  Still radiating down the L leg.  No B/B sx.  No new weakness.    He had f/u renal u/s.   D/w pt about imaging results with benign findings.  Prev Cr wnl.    Diabetes:  Using medications without difficulties: yes Hypoglycemic episodes: no Hyperglycemic episodes: no Feet problems: he has L leg and foot tingling from back issues not from DM2, dw pt.  No R foot tingling.   Blood Sugars averaging: 120-160 usually in the AM, diet dependent eye exam within last year: due, d/w pt.   A1c pending.  D/w pt about cutting back on bread.    Defer statin at this point, d/w pt.  Goal is to get his pain controlled better first.    PMH and SH reviewed  Meds, vitals, and allergies reviewed.   ROS: Per HPI unless specifically indicated in ROS section   GEN: nad, alert and oriented HEENT: mucous membranes moist NECK: supple w/o LA CV: rrr. PULM: ctab, no inc wob ABD: soft, +bs EXT: no edema SKIN: no acute rash but medial L thigh bruise presumed to be from the groin (when he twisted awkwardly as described above) but not having groin pain now.   L leg with paresthesias at baseline.

## 2017-04-08 NOTE — Patient Instructions (Signed)
Go to the lab on the way out.  We'll contact you with your lab report. Start gabapentin 100mg  at night.  Gradually increase the dose as tolerated.  Update me as needed.  Plan on a recheck in about 3 months, labs ahead of time.  Take care.  Glad to see you.

## 2017-04-09 LAB — HEMOGLOBIN A1C: Hgb A1c MFr Bld: 8 % — ABNORMAL HIGH (ref 4.6–6.5)

## 2017-04-09 NOTE — Assessment & Plan Note (Signed)
Discussed with patient about medication options.  Routine cautions given. Start gabapentin 100mg  at night.  Gradually increase the dose as tolerated. Update me as needed.  He agrees.

## 2017-04-09 NOTE — Assessment & Plan Note (Signed)
Defer statin at this point, d/w pt.  Goal is to get his pain controlled better first.   See notes on labs.  A1c pending. Discussed with patient about diet.  See above. >25 minutes spent in face to face time with patient, >50% spent in counselling or coordination of care, discussing pain, medication options, previous imaging, diabetes, etc.

## 2017-04-10 ENCOUNTER — Ambulatory Visit: Payer: Self-pay | Admitting: General Practice

## 2017-04-22 ENCOUNTER — Other Ambulatory Visit: Payer: Self-pay | Admitting: Family Medicine

## 2017-04-23 NOTE — Telephone Encounter (Signed)
Electronic refill request See anti-coag visit on 04/10/17 Last office visit 04/08/17 Should this medication be refilled?

## 2017-04-24 NOTE — Telephone Encounter (Signed)
Has been on xarelto, not coumadin.  Okay to continue.  rx sent. Thanks.

## 2017-04-27 ENCOUNTER — Other Ambulatory Visit: Payer: Self-pay | Admitting: Family Medicine

## 2017-04-30 ENCOUNTER — Telehealth: Payer: Self-pay | Admitting: Family Medicine

## 2017-04-30 MED ORDER — GLIMEPIRIDE 4 MG PO TABS: ORAL_TABLET | ORAL | 11 refills | Status: DC

## 2017-04-30 NOTE — Telephone Encounter (Signed)
Pt said he does not have insurance right now , that is the problem. He says he can not call insurance. Please call patient and let them know what can be done. He said he doesn't have a blood thinner to take tonight & wants to know if he can take a baby aspirin

## 2017-04-30 NOTE — Telephone Encounter (Signed)
Best number [phone]  Pt came in today stating  That when he went to pick up his rx xarelto 20mg  tab   Something is wrong with his insurance   The rx was $500 for 1 month he wanted to know if you could call something else that would be cheaper.  He took his last pill last night  He also stated he needed to get a refill on [address] [place]

## 2017-04-30 NOTE — Telephone Encounter (Signed)
Many thanks.  Please update me tomorrow when you hear from patient.

## 2017-04-30 NOTE — Telephone Encounter (Addendum)
Tim Hodge will you please check with patient.  I think all of the NOACs are likely going to be expensive for the patient.  He'll have to check with his insurance company.   He can ask the pharmacy to fill a 5 day supply in the meantime to bridge him.  The other option is to put him on coumadin.   That would be better than nothing.  He has a h/o PE.    And I sent the amaryl.    Many thanks.

## 2017-04-30 NOTE — Telephone Encounter (Signed)
LM (okay per DPR) with instructions.    I, also, discussed this further with Dr. Darnell Level as it was after hours when I received this message on patient.  Message left for patient informing him that the safest, best option would be for him to pick up a 5-7 day supply from pharmacy (cost around 100 - 116.00).  I explained that we require a few days of overlap with a transition to coumadin as it takes some time for the cheaper alternative (coumadin) to take effect.    If patient absolutely cannot afford this than it would be wise for him to at least take the Baby ASA but he is at risk.    Patient given my direct number in the coumadin clinic to call tomorrow and discuss moving forward.    These messages were left both on mobile and home numbers provided.

## 2017-05-01 NOTE — Telephone Encounter (Signed)
I have made multiple attempts to reach patient and discuss transition to coumadin.  I do not feel comfortable calling in coumadin to pharmacy without discussing with patient and/or wife first.

## 2017-05-02 NOTE — Telephone Encounter (Signed)
He could take 2 baby aspirins (162mg ) a day.  That would be above the 100mg  threshold, instead of only 81mg  a day.  That isn't a true alternative to anticoagulation.  Anticoagulation is still better than aspirin in general.  However, 162mg  aspirin is better than nothing.  Thanks.

## 2017-05-02 NOTE — Telephone Encounter (Signed)
Spoke with patient via phone.  He verbalizes understanding of Dr. Josefine Class directives in prior note and will start taking 2 Baby aspirin until he can restart his Xarelto.  Thanks.

## 2017-05-02 NOTE — Telephone Encounter (Signed)
Spoke with patient at length regarding transitioning to coumadin.    Patient is currently taking 1 baby ASA per day until he comes back from out of town and then is planning to pay out of pocket for his Xarelto until his insurance kicks back in.  He does not want to start coumadin.     I educated patient on increased risks associated with taking ASA instead of Xarelto or Coumadin.  He is aware to go to ER immediately if develops any concerning symptoms.  Patient to call if he has any trouble getting back on his Xarelto and knows to resume tx as soon as possible.   Dr. Damita Dunnings made aware of patient's decision. Patient would like to know if he should take a stronger mg ASA in meantime?

## 2017-05-02 NOTE — Telephone Encounter (Signed)
Agreed- we need call back from patient.  Thanks for trying to get in touch with him.

## 2017-06-25 ENCOUNTER — Other Ambulatory Visit: Payer: Self-pay | Admitting: Family Medicine

## 2017-06-25 NOTE — Telephone Encounter (Signed)
Sent. Thanks.   

## 2017-06-25 NOTE — Telephone Encounter (Signed)
Electronic refill request Last office visit 04/08/17 Last refill 04/02/16 #90/3

## 2017-07-15 ENCOUNTER — Other Ambulatory Visit: Payer: Self-pay | Admitting: Family Medicine

## 2017-07-15 ENCOUNTER — Other Ambulatory Visit (INDEPENDENT_AMBULATORY_CARE_PROVIDER_SITE_OTHER): Payer: BC Managed Care – PPO

## 2017-07-15 DIAGNOSIS — E119 Type 2 diabetes mellitus without complications: Secondary | ICD-10-CM

## 2017-07-15 LAB — LIPID PANEL
CHOLESTEROL: 173 mg/dL (ref 0–200)
HDL: 44 mg/dL (ref >39.00)
LDL Cholesterol: 111 mg/dL — ABNORMAL HIGH (ref 0–99)
NonHDL: 128.83
Total CHOL/HDL Ratio: 4
Triglycerides: 87 mg/dL (ref 0.0–149.0)
VLDL: 17.4 mg/dL (ref 0.0–40.0)

## 2017-07-15 LAB — COMPREHENSIVE METABOLIC PANEL
ALBUMIN: 3.6 g/dL (ref 3.5–5.2)
ALK PHOS: 112 U/L (ref 39–117)
ALT: 38 U/L (ref 0–53)
AST: 32 U/L (ref 0–37)
BILIRUBIN TOTAL: 1 mg/dL (ref 0.2–1.2)
BUN: 16 mg/dL (ref 6–23)
CO2: 30 mEq/L (ref 19–32)
CREATININE: 0.89 mg/dL (ref 0.40–1.50)
Calcium: 9.1 mg/dL (ref 8.4–10.5)
Chloride: 104 mEq/L (ref 96–112)
GFR: 92.32 mL/min (ref >60.00)
Glucose, Bld: 182 mg/dL — ABNORMAL HIGH (ref 70–99)
Potassium: 4.2 mEq/L (ref 3.5–5.1)
SODIUM: 137 meq/L (ref 135–145)
TOTAL PROTEIN: 6.7 g/dL (ref 6.0–8.3)

## 2017-07-15 LAB — HEMOGLOBIN A1C: HEMOGLOBIN A1C: 7.1 % — AB (ref 4.6–6.5)

## 2017-07-23 ENCOUNTER — Other Ambulatory Visit: Payer: Self-pay | Admitting: Family Medicine

## 2017-07-23 DIAGNOSIS — E119 Type 2 diabetes mellitus without complications: Secondary | ICD-10-CM

## 2017-07-24 ENCOUNTER — Encounter: Payer: Self-pay | Admitting: *Deleted

## 2017-10-13 ENCOUNTER — Other Ambulatory Visit (INDEPENDENT_AMBULATORY_CARE_PROVIDER_SITE_OTHER): Payer: BC Managed Care – PPO

## 2017-10-13 DIAGNOSIS — E119 Type 2 diabetes mellitus without complications: Secondary | ICD-10-CM

## 2017-10-13 LAB — CBC WITH DIFFERENTIAL/PLATELET
BASOS PCT: 0.8 % (ref 0.0–3.0)
Basophils Absolute: 0 10*3/uL (ref 0.0–0.1)
EOS PCT: 3.7 % (ref 0.0–5.0)
Eosinophils Absolute: 0.2 10*3/uL (ref 0.0–0.7)
HCT: 41.9 % (ref 39.0–52.0)
Hemoglobin: 14.2 g/dL (ref 13.0–17.0)
LYMPHS ABS: 1.5 10*3/uL (ref 0.7–4.0)
Lymphocytes Relative: 26.4 % (ref 12.0–46.0)
MCHC: 33.8 g/dL (ref 30.0–36.0)
MCV: 93.5 fl (ref 78.0–100.0)
MONO ABS: 0.6 10*3/uL (ref 0.1–1.0)
Monocytes Relative: 10.9 % (ref 3.0–12.0)
NEUTROS PCT: 58.2 % (ref 43.0–77.0)
Neutro Abs: 3.4 10*3/uL (ref 1.4–7.7)
Platelets: 160 10*3/uL (ref 150.0–400.0)
RBC: 4.48 Mil/uL (ref 4.22–5.81)
RDW: 13.1 % (ref 11.5–15.5)
WBC: 5.8 10*3/uL (ref 4.0–10.5)

## 2017-10-13 LAB — HEMOGLOBIN A1C: Hgb A1c MFr Bld: 7.9 % — ABNORMAL HIGH (ref 4.6–6.5)

## 2017-10-16 ENCOUNTER — Ambulatory Visit: Payer: BC Managed Care – PPO | Admitting: Family Medicine

## 2017-10-16 ENCOUNTER — Encounter: Payer: Self-pay | Admitting: Family Medicine

## 2017-10-16 VITALS — BP 122/76 | HR 78 | Temp 98.0°F | Ht 72.5 in | Wt 309.5 lb

## 2017-10-16 DIAGNOSIS — Z86711 Personal history of pulmonary embolism: Secondary | ICD-10-CM | POA: Diagnosis not present

## 2017-10-16 DIAGNOSIS — G8929 Other chronic pain: Secondary | ICD-10-CM

## 2017-10-16 DIAGNOSIS — E119 Type 2 diabetes mellitus without complications: Secondary | ICD-10-CM | POA: Diagnosis not present

## 2017-10-16 DIAGNOSIS — I10 Essential (primary) hypertension: Secondary | ICD-10-CM | POA: Diagnosis not present

## 2017-10-16 DIAGNOSIS — M549 Dorsalgia, unspecified: Secondary | ICD-10-CM

## 2017-10-16 MED ORDER — LISINOPRIL 10 MG PO TABS: 5.0000 mg | ORAL_TABLET | Freq: Every day | ORAL | Status: DC

## 2017-10-16 MED ORDER — GABAPENTIN 300 MG PO CAPS: 300.0000 mg | ORAL_CAPSULE | Freq: Three times a day (TID) | ORAL | 3 refills | Status: DC

## 2017-10-16 NOTE — Patient Instructions (Signed)
Call about an eye exam when you can.  Try the 300mg  tab of gabapentin.  Recheck in about 3 months.  The only lab you need to have done for your next diabetic visit is an A1c.  We can do this with a fingerstick test at the office visit.  You do not need a lab visit ahead of time for this.  It does not matter if you are fasting when the lab is done.   Take care.  Glad to see you.  Cut the lisinopril in half and see if that helps.

## 2017-10-16 NOTE — Progress Notes (Signed)
Still with same chronic back pain.  Taking gabapentin with some relief.  He was taking it more at night, 100mg  tabs, 1800mg  a day total.  It helps some.  D/w pt about tab change with similar total dosing.  See orders.  He'll update me about his response to the medicine.  Still radiating down the L leg, at baseline.    Diabetes:  Using medications without difficulties: yes Hypoglycemic episodes:no Hyperglycemic episodes:no Feet problems: tingling ever since prev surgery.  Not on R foot.  Blood Sugars averaging: 120-150s usually  eye exam within last year: due, awaiting insurance help with that.  He is checking on that.  D/w pt.   Activity limited by pain.  Labs d/w pt.   Diet d/w pt.  Encouraged.  "I'm working on it."    Hypertension:    Using medication without problems or lightheadedness:  ED noted.  O/w taking med w/o ADE.   Chest pain with exertion:no Edema:no Short of breath:no  H/o PE. Labs d/w pt.  CBC wnl.  Still on anticoagulation.    PMH and SH reviewed  ROS: Per HPI unless specifically indicated in ROS section   Meds, vitals, and allergies reviewed.   GEN: nad, alert and oriented HEENT: mucous membranes moist NECK: supple w/o LA CV: rrr. PULM: ctab, no inc wob ABD: soft, +bs EXT: trace BLE edema SKIN: no acute rash Lower back ttp at baseline.   Able to bear weight.   Diabetic foot exam: Normal inspection No skin breakdown No calluses  Normal DP pulses Normal sensation to light touch and monofilament Nails normal

## 2017-10-17 NOTE — Assessment & Plan Note (Signed)
He'll work on diet and exercise, as best he can.  No change in meds at this point.  Recheck A1c in about 3 months.  He agrees, labs d/w pt.

## 2017-10-17 NOTE — Assessment & Plan Note (Signed)
H/o PE. Labs d/w pt.  CBC wnl.  Still on anticoagulation.   Continue as is for now.  No abnormal bleeding.

## 2017-10-17 NOTE — Assessment & Plan Note (Signed)
ED may be worse from slightly overtreated HTN.  He'll cut lisinopril back to 5mg  a day and update me as needed.  D/w pt.  He agrees.   >25 minutes spent in face to face time with patient, >50% spent in counselling or coordination of care.

## 2017-10-17 NOTE — Assessment & Plan Note (Signed)
He'll try the 300mg  tabs of gabapentin.  It may be that 600mg  right before bed does better than more frequent but lower doses.  He'll update me as needed.  No new sx, no weakness.

## 2017-10-24 ENCOUNTER — Ambulatory Visit: Payer: Self-pay | Admitting: Family Medicine

## 2018-01-16 ENCOUNTER — Encounter: Payer: Self-pay | Admitting: Family Medicine

## 2018-01-16 ENCOUNTER — Ambulatory Visit: Payer: BC Managed Care – PPO | Admitting: Family Medicine

## 2018-01-16 ENCOUNTER — Other Ambulatory Visit: Payer: Self-pay | Admitting: *Deleted

## 2018-01-16 VITALS — BP 116/70 | HR 81 | Temp 98.2°F | Ht 72.5 in | Wt 305.0 lb

## 2018-01-16 DIAGNOSIS — Z23 Encounter for immunization: Secondary | ICD-10-CM

## 2018-01-16 DIAGNOSIS — M549 Dorsalgia, unspecified: Secondary | ICD-10-CM | POA: Diagnosis not present

## 2018-01-16 DIAGNOSIS — Z86711 Personal history of pulmonary embolism: Secondary | ICD-10-CM

## 2018-01-16 DIAGNOSIS — G8929 Other chronic pain: Secondary | ICD-10-CM

## 2018-01-16 DIAGNOSIS — E119 Type 2 diabetes mellitus without complications: Secondary | ICD-10-CM | POA: Diagnosis not present

## 2018-01-16 LAB — POCT GLYCOSYLATED HEMOGLOBIN (HGB A1C): HEMOGLOBIN A1C: 7.8 % — AB (ref 4.0–5.6)

## 2018-01-16 MED ORDER — GABAPENTIN 300 MG PO CAPS: 300.0000 mg | ORAL_CAPSULE | Freq: Three times a day (TID) | ORAL | Status: DC

## 2018-01-16 MED ORDER — RIVAROXABAN 20 MG PO TABS: ORAL_TABLET | ORAL | 11 refills | Status: DC

## 2018-01-16 NOTE — Patient Instructions (Addendum)
Call about an eye exam when possible.  Flu shot today.  Take care.  Glad to see you.  Recheck in about 4 months.  The only lab you need to have done for your next diabetic visit is an A1c.  We can do this with a fingerstick test at the office visit.  You do not need a lab visit ahead of time for this.  It does not matter if you are fasting when the lab is done.    Call Dr. [facility] about a follow up colonoscopy.   Address: [address] [place], [place], [place] [address] Phone: [phone]

## 2018-01-16 NOTE — Progress Notes (Signed)
Diabetes:  Using medications without difficulties: yes Hypoglycemic episodes:no Hyperglycemic episodes:no Feet problems:  Only from prev injury.   Blood Sugars averaging: ~140-160s eye exam within last year: due, d/w pt.  He can call about appointment.   A1c d/w pt.  Similar to prev at 7.8.   He has been walking some.  He is working on diet, portion control and food choices.  D/w pt.  We talked about short term vs longer term goals.    After prev injury, he was finally able to get insurance coverage back.  D/w pt.   Gabapentin helped some with pain.  Taking 1 tab TID prn.  His pain is lower than prev.  He didn't tolerate a higher dose, due to sedation.  His pain is manageable compared to prev, so it makes sense to continue as is.  He agrees.    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  Still on xarelto.  No ADE on med.  No bleeding unless he has a skin scrape, etc.    He'll call about GI f/u, see avs.  D/w pt.  He agrees.   Meds, vitals, and allergies reviewed.   ROS: Per HPI unless specifically indicated in ROS section   GEN: nad, alert and oriented HEENT: mucous membranes moist NECK: supple w/o LA CV: rrr. PULM: ctab, no inc wob ABD: soft, +bs EXT: no edema  Diabetic foot exam: Normal inspection No skin breakdown No calluses  Normal DP pulses Normal sensation to light touch and monofilament Nails normal

## 2018-01-18 NOTE — Assessment & Plan Note (Signed)
Continue anticoagulation.  He agrees.  See above.  No reason to stop treatment at this point.

## 2018-01-18 NOTE — Assessment & Plan Note (Signed)
Continue with gabapentin.  Update me as needed.  He agrees.

## 2018-01-18 NOTE — Assessment & Plan Note (Signed)
A1c d/w pt.  Similar to prev at 7.8.   He has been walking some.  He is working on diet, portion control and food choices.  D/w pt.  We talked about short term vs longer term goals.  Continue work on diet and exercise.  Will recheck periodically.  No change in medications at this point.  He agrees.

## 2018-02-25 ENCOUNTER — Telehealth: Payer: Self-pay | Admitting: Family Medicine

## 2018-02-25 NOTE — Telephone Encounter (Signed)
Pt said he threw away his Januvia bottle and [facility] [address] told pt doctors office would have to call pharmacy about refill. Pt should have refills thru 06/2018. Pharmacy not open yet.

## 2018-02-25 NOTE — Telephone Encounter (Signed)
error 

## 2018-02-25 NOTE — Telephone Encounter (Signed)
I called [facility] [address]; spoke with [facility] and pt has available refills for Januvia and will get rx ready for pick up. Pt will ck with pharmacy later today.nothing further needed.

## 2018-05-18 ENCOUNTER — Encounter: Payer: Self-pay | Admitting: Family Medicine

## 2018-05-18 ENCOUNTER — Ambulatory Visit: Payer: BC Managed Care – PPO | Admitting: Family Medicine

## 2018-05-18 VITALS — BP 116/76 | HR 73 | Temp 97.7°F | Ht 72.5 in | Wt 313.5 lb

## 2018-05-18 DIAGNOSIS — M549 Dorsalgia, unspecified: Secondary | ICD-10-CM | POA: Diagnosis not present

## 2018-05-18 DIAGNOSIS — G8929 Other chronic pain: Secondary | ICD-10-CM

## 2018-05-18 DIAGNOSIS — E119 Type 2 diabetes mellitus without complications: Secondary | ICD-10-CM | POA: Diagnosis not present

## 2018-05-18 LAB — POCT GLYCOSYLATED HEMOGLOBIN (HGB A1C): HEMOGLOBIN A1C: 9 % — AB (ref 4.0–5.6)

## 2018-05-18 LAB — HM DIABETES EYE EXAM

## 2018-05-18 NOTE — Progress Notes (Signed)
Diabetes:  Using medications without difficulties: yes Hypoglycemic episodes: no Hyperglycemic episodes: no Feet problems: some tingling in the fingers and toes, intermittently.   Blood Sugars averaging: usually ~ 150, d/w pt about holidays and diet.   eye exam within last year:yes, done about month ago.   A1c up 9.  He was expecting an increase, d/w pt.    He is putting up with back pain that is a little worse with the weather changes.  He has some relief from gabapentin w/o ADE.   He can call about f/u colonoscopy.  D/w pt.  See avs.    Meds, vitals, and allergies reviewed.   ROS: Per HPI unless specifically indicated in ROS section   GEN: nad, alert and oriented HEENT: mucous membranes moist NECK: supple w/o LA CV: rrr. PULM: ctab, no inc wob ABD: soft, +bs EXT: no edema SKIN: well perfused.

## 2018-05-18 NOTE — Patient Instructions (Addendum)
Call Dr. [facility] about a follow up colonoscopy.   Address: [address], [place], [place] [address] Phone: [phone]  Recheck in about 3 months with labs ahead of a physical.  Take care.  Glad to see you.  It your sugar isn't getting better with diet in the meantime, then let me know.

## 2018-05-20 NOTE — Assessment & Plan Note (Signed)
A1c up 9.  He was expecting an increase, d/w pt.   He is going to work more on diet and exercise and see how much change he can get at home.  If he is not getting any improvement in his sugar in the meantime then he will let me know.  See after visit summary.

## 2018-05-20 NOTE — Assessment & Plan Note (Signed)
He will continue as is. He is putting up with back pain that is a little worse with the weather changes.  He has some relief from gabapentin w/o ADE.  He will update me as needed.  He did not tolerate a higher dose of gabapentin.

## 2018-05-21 ENCOUNTER — Encounter: Payer: Self-pay | Admitting: Family Medicine

## 2018-05-30 ENCOUNTER — Other Ambulatory Visit: Payer: Self-pay | Admitting: Family Medicine

## 2018-06-24 ENCOUNTER — Other Ambulatory Visit: Payer: Self-pay | Admitting: Family Medicine

## 2018-08-09 ENCOUNTER — Other Ambulatory Visit: Payer: Self-pay | Admitting: Family Medicine

## 2018-08-17 ENCOUNTER — Other Ambulatory Visit: Payer: Self-pay

## 2018-08-24 ENCOUNTER — Encounter: Payer: Self-pay | Admitting: Family Medicine

## 2018-10-29 ENCOUNTER — Telehealth: Payer: Self-pay

## 2018-10-29 NOTE — Telephone Encounter (Signed)
Left detailed VM w COVID screen and back door lab info   

## 2018-11-01 ENCOUNTER — Other Ambulatory Visit: Payer: Self-pay | Admitting: Family Medicine

## 2018-11-01 DIAGNOSIS — E119 Type 2 diabetes mellitus without complications: Secondary | ICD-10-CM

## 2018-11-02 ENCOUNTER — Other Ambulatory Visit: Payer: Self-pay

## 2018-11-02 ENCOUNTER — Other Ambulatory Visit (INDEPENDENT_AMBULATORY_CARE_PROVIDER_SITE_OTHER): Payer: Medicare Other

## 2018-11-02 DIAGNOSIS — E119 Type 2 diabetes mellitus without complications: Secondary | ICD-10-CM | POA: Diagnosis not present

## 2018-11-02 LAB — COMPREHENSIVE METABOLIC PANEL
ALT: 46 U/L (ref 0–53)
AST: 40 U/L — ABNORMAL HIGH (ref 0–37)
Albumin: 3.6 g/dL (ref 3.5–5.2)
Alkaline Phosphatase: 151 U/L — ABNORMAL HIGH (ref 39–117)
BUN: 13 mg/dL (ref 6–23)
CO2: 27 mEq/L (ref 19–32)
Calcium: 8.7 mg/dL (ref 8.4–10.5)
Chloride: 103 mEq/L (ref 96–112)
Creatinine, Ser: 0.89 mg/dL (ref 0.40–1.50)
GFR: 86.49 mL/min (ref >60.00)
Glucose, Bld: 306 mg/dL — ABNORMAL HIGH (ref 70–99)
Potassium: 4.3 mEq/L (ref 3.5–5.1)
Sodium: 136 mEq/L (ref 135–145)
Total Bilirubin: 1.1 mg/dL (ref 0.2–1.2)
Total Protein: 6 g/dL (ref 6.0–8.3)

## 2018-11-02 LAB — CBC WITH DIFFERENTIAL/PLATELET
Basophils Absolute: 0 10*3/uL (ref 0.0–0.1)
Basophils Relative: 0.5 % (ref 0.0–3.0)
Eosinophils Absolute: 0.2 10*3/uL (ref 0.0–0.7)
Eosinophils Relative: 4.1 % (ref 0.0–5.0)
HCT: 41.9 % (ref 39.0–52.0)
Hemoglobin: 14.3 g/dL (ref 13.0–17.0)
Lymphocytes Relative: 25.6 % (ref 12.0–46.0)
Lymphs Abs: 1.1 10*3/uL (ref 0.7–4.0)
MCHC: 34.1 g/dL (ref 30.0–36.0)
MCV: 95.3 fl (ref 78.0–100.0)
Monocytes Absolute: 0.6 10*3/uL (ref 0.1–1.0)
Monocytes Relative: 13 % — ABNORMAL HIGH (ref 3.0–12.0)
Neutro Abs: 2.5 10*3/uL (ref 1.4–7.7)
Neutrophils Relative %: 56.8 % (ref 43.0–77.0)
Platelets: 130 10*3/uL — ABNORMAL LOW (ref 150.0–400.0)
RBC: 4.39 Mil/uL (ref 4.22–5.81)
RDW: 13.1 % (ref 11.5–15.5)
WBC: 4.4 10*3/uL (ref 4.0–10.5)

## 2018-11-02 LAB — LIPID PANEL
Cholesterol: 167 mg/dL (ref 0–200)
HDL: 42 mg/dL (ref >39.00)
LDL Cholesterol: 105 mg/dL — ABNORMAL HIGH (ref 0–99)
NonHDL: 125.07
Total CHOL/HDL Ratio: 4
Triglycerides: 100 mg/dL (ref 0.0–149.0)
VLDL: 20 mg/dL (ref 0.0–40.0)

## 2018-11-02 LAB — HEMOGLOBIN A1C: Hgb A1c MFr Bld: 8.9 % — ABNORMAL HIGH (ref 4.6–6.5)

## 2018-11-05 ENCOUNTER — Other Ambulatory Visit: Payer: Self-pay

## 2018-11-05 ENCOUNTER — Ambulatory Visit (INDEPENDENT_AMBULATORY_CARE_PROVIDER_SITE_OTHER): Payer: Medicare Other | Admitting: Family Medicine

## 2018-11-05 ENCOUNTER — Encounter: Payer: Self-pay | Admitting: Family Medicine

## 2018-11-05 VITALS — BP 110/72 | HR 74 | Temp 98.4°F | Ht 72.5 in | Wt 311.2 lb

## 2018-11-05 DIAGNOSIS — G8929 Other chronic pain: Secondary | ICD-10-CM

## 2018-11-05 DIAGNOSIS — Z7189 Other specified counseling: Secondary | ICD-10-CM

## 2018-11-05 DIAGNOSIS — Z86711 Personal history of pulmonary embolism: Secondary | ICD-10-CM

## 2018-11-05 DIAGNOSIS — Z0001 Encounter for general adult medical examination with abnormal findings: Secondary | ICD-10-CM

## 2018-11-05 DIAGNOSIS — R7989 Other specified abnormal findings of blood chemistry: Secondary | ICD-10-CM

## 2018-11-05 DIAGNOSIS — E119 Type 2 diabetes mellitus without complications: Secondary | ICD-10-CM

## 2018-11-05 DIAGNOSIS — Z1211 Encounter for screening for malignant neoplasm of colon: Secondary | ICD-10-CM

## 2018-11-05 DIAGNOSIS — M549 Dorsalgia, unspecified: Secondary | ICD-10-CM | POA: Diagnosis not present

## 2018-11-05 DIAGNOSIS — R945 Abnormal results of liver function studies: Secondary | ICD-10-CM | POA: Diagnosis not present

## 2018-11-05 DIAGNOSIS — I10 Essential (primary) hypertension: Secondary | ICD-10-CM | POA: Diagnosis not present

## 2018-11-05 MED ORDER — RIVAROXABAN 20 MG PO TABS: ORAL_TABLET | ORAL | 5 refills | Status: DC

## 2018-11-05 MED ORDER — LISINOPRIL 10 MG PO TABS: 10.0000 mg | ORAL_TABLET | Freq: Every day | ORAL | 5 refills | Status: DC

## 2018-11-05 MED ORDER — GLIMEPIRIDE 4 MG PO TABS: ORAL_TABLET | ORAL | 5 refills | Status: DC

## 2018-11-05 MED ORDER — SITAGLIPTIN PHOSPHATE 100 MG PO TABS: 100.0000 mg | ORAL_TABLET | Freq: Every day | ORAL | 5 refills | Status: DC

## 2018-11-05 MED ORDER — GABAPENTIN 300 MG PO CAPS: 300.0000 mg | ORAL_CAPSULE | Freq: Three times a day (TID) | ORAL | 5 refills | Status: DC

## 2018-11-05 NOTE — Progress Notes (Signed)
CPE- See plan.  Routine anticipatory guidance given to patient.  See health maintenance.  The possibility exists that previously documented standard health maintenance information may have been brought forward from a previous encounter into this note.  If needed, that same information has been updated to reflect the current situation based on today's encounter.    Tetanus 2016 Flu yearly  PNA vaccine up to date.  Shingles d/w pt.   Colonoscopy 2016, d/w pt about f/u. See referral.   Prostate cancer screening and PSA options (with potential risks and benefits of testing vs not testing) were discussed along with recent recs/guidelines.  He declined testing PSA at this point. Living will d/w pt.  Wife designated if patient were incapacitated.   Diet and exercise affected by back pain.  Chronic back pain.  He has used hemp pill with some relief of back pain.  No ADE on med. Still taking gabapentin and able to tolerate current dose.  He is trying to walk more.  He can tolerate his pain level as is, d/w pt.  Pain is less bad than prev but clearly not resolved.   Diabetes:  Using medications without difficulties: yes Hypoglycemic episodes: no Hyperglycemic episodes: no Feet problems: some occ tingling, not constant.  Still on gabapentin at baseline.  Blood Sugars averaging: 155 on recent check but his meter may have been running artificially low.   eye exam within last year: yes A1c d/w pt.  Similar to prev.   Anticoagulation.  Compliant. No bleeding unless he gets cut.  He has occ mild bleeding with dental cleaning but that happened at baseline.  No black stools.  No blood in stool.    Hypertension:    Using medication without problems or lightheadedness: yes Chest pain with exertion:no Edema:no Short of breath:no Average home BPs:no  Pandemic considerations d/w pt.    LFT elevation noted, discussed with patient.  Had improved previously, had normalized.  Now slightly elevated again.   Discussed with patient about checking liver ultrasound.  We are going to refer to GI anyway for colon cancer screening.  PMH and SH reviewed  Meds, vitals, and allergies reviewed.   ROS: Per HPI.  Unless specifically indicated otherwise in HPI, the patient denies:  General: fever. Eyes: acute vision changes ENT: sore throat Cardiovascular: chest pain Respiratory: SOB GI: vomiting GU: dysuria Musculoskeletal: acute back pain Derm: acute rash Neuro: acute motor dysfunction Psych: worsening mood Endocrine: polydipsia Heme: bleeding Allergy: hayfever  GEN: nad, alert and oriented HEENT: ncat NECK: supple w/o LA CV: rrr. PULM: ctab, no inc wob ABD: soft, +bs EXT: no edema SKIN: no acute rash  Diabetic foot exam: Normal inspection No skin breakdown No calluses  Normal DP pulses Normal sensation to light touch and monofilament Nails normal

## 2018-11-05 NOTE — Patient Instructions (Addendum)
Check with your insurance to see if they will cover the shingrix shot. I would get a flu shot each fall.   We'll call about getting you an appointment with Dr. Vira Agar with GI.   We'll call about the ultrasound to check your liver.  We need to make plans about cholesterol and diabetes after that.  Take care.  Glad to see you.  Recheck in 3 months with A1c at the visit.

## 2018-11-08 DIAGNOSIS — R7989 Other specified abnormal findings of blood chemistry: Secondary | ICD-10-CM | POA: Insufficient documentation

## 2018-11-08 NOTE — Assessment & Plan Note (Addendum)
For now, he is going to continue work on diet and also work on exercise as tolerated.  No change in meds at this point. Recheck in 3 months with A1c at the visit.   Liver ultrasound pending.

## 2018-11-08 NOTE — Assessment & Plan Note (Signed)
Tetanus 2016 Flu yearly  PNA vaccine up to date.  Shingles d/w pt.   Colonoscopy 2016, d/w pt about f/u. See referral.   Prostate cancer screening and PSA options (with potential risks and benefits of testing vs not testing) were discussed along with recent recs/guidelines.  He declined testing PSA at this point. Living will d/w pt.  Wife designated if patient were incapacitated.   Diet and exercise affected by back pain.

## 2018-11-08 NOTE — Assessment & Plan Note (Signed)
Compliant. No bleeding unless he gets cut.  He has occ mild bleeding with dental cleaning but that happened at baseline.  No black stools.  No blood in stool.  Continue as is.

## 2018-11-08 NOTE — Assessment & Plan Note (Signed)
Living will d/w pt.  Wife designated if patient were incapacitated.   ?

## 2018-11-08 NOTE — Assessment & Plan Note (Signed)
No change in meds.  Continue work on diet and exercise.  Labs discussed with patient. 

## 2018-11-08 NOTE — Assessment & Plan Note (Signed)
Continue as is for now. He has used hemp pill with some relief of back pain.  No ADE on med. Still taking gabapentin and able to tolerate current dose.  He is trying to walk more.  He can tolerate his pain level as is, d/w pt.  Pain is less bad than prev but clearly not resolved.

## 2018-11-08 NOTE — Assessment & Plan Note (Signed)
LFT elevation noted, discussed with patient.  Had improved previously, had normalized.  Now slightly elevated again.  Discussed with patient about checking liver ultrasound.  We are going to refer to GI anyway for colon cancer screening.  We need to make plans about his cholesterol and diabetes after that.

## 2018-11-16 ENCOUNTER — Ambulatory Visit
Admission: RE | Admit: 2018-11-16 | Discharge: 2018-11-16 | Disposition: A | Discharge status: Home / Self Care | Payer: Medicare Other | Source: Ambulatory Visit | Attending: Family Medicine | Admitting: Family Medicine

## 2018-11-16 DIAGNOSIS — R7989 Other specified abnormal findings of blood chemistry: Secondary | ICD-10-CM

## 2018-11-16 IMAGING — US ULTRASOUND ABDOMEN LIMITED
1 series · 14 of 25 positions shown · non-contrast
Comparison: None.

CLINICAL DATA: Elevated liver enzymes

EXAM:
ULTRASOUND ABDOMEN LIMITED RIGHT UPPER QUADRANT

[Series 1: ultrasound abdomen limited · 0.26mm/px · 14 of 41 slices shown]
[im 1/41]
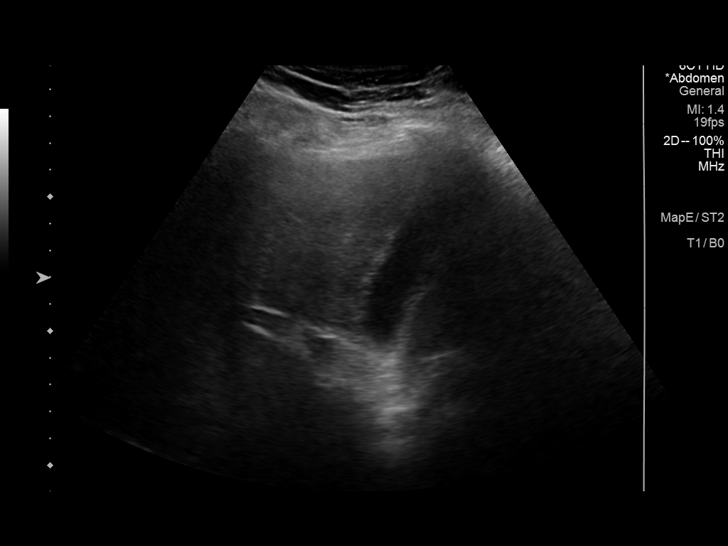
[im 4/41]
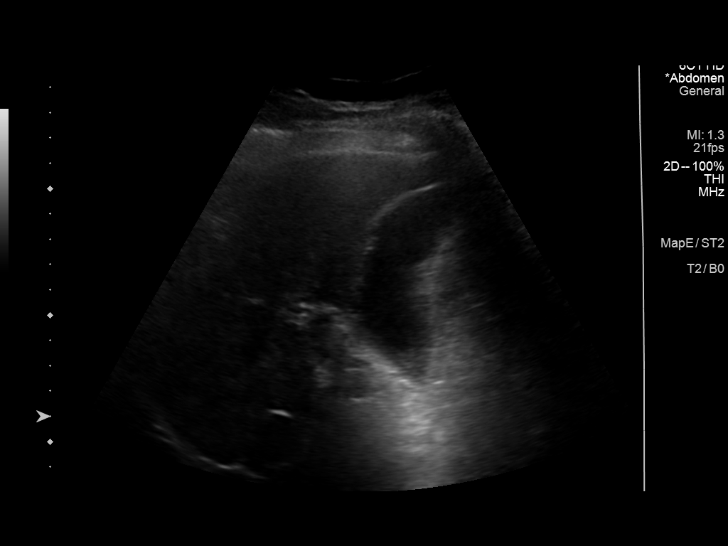
[im 7/41]
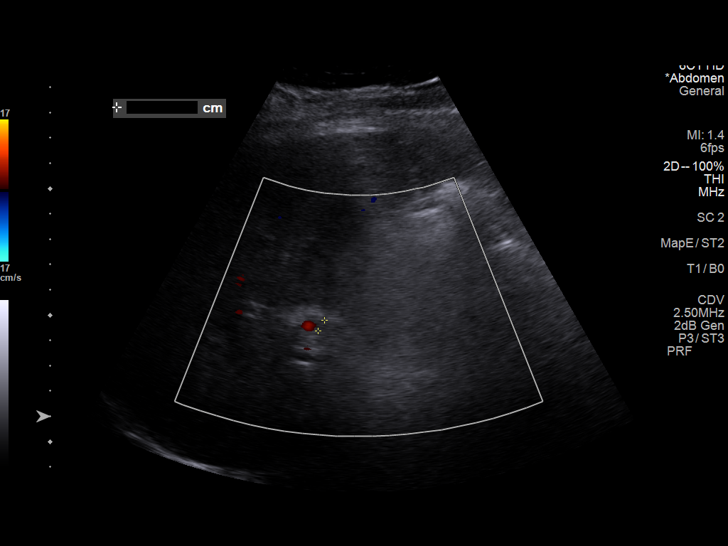
[im 11/41]
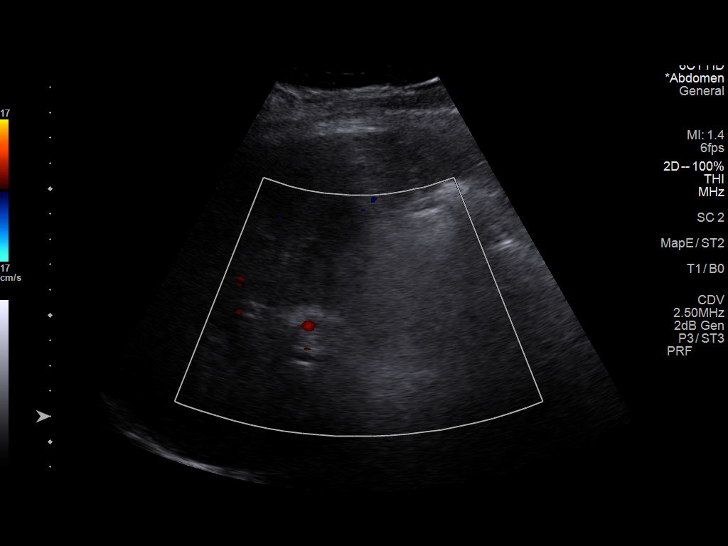
[im 14/41]
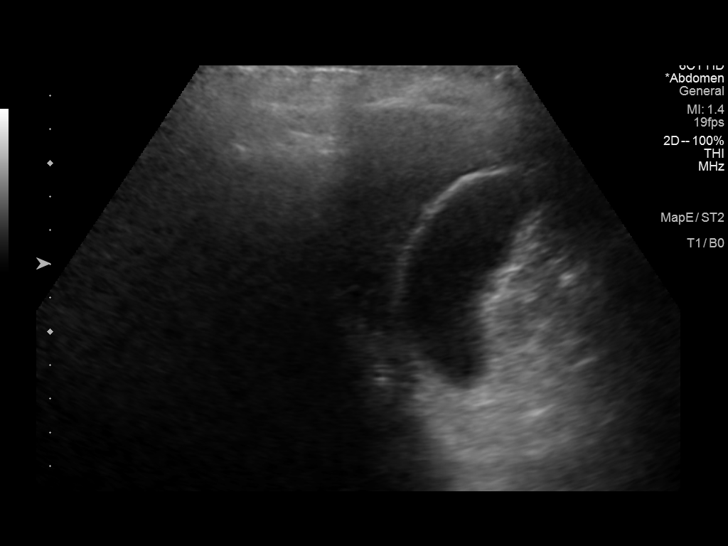
[im 16/41]
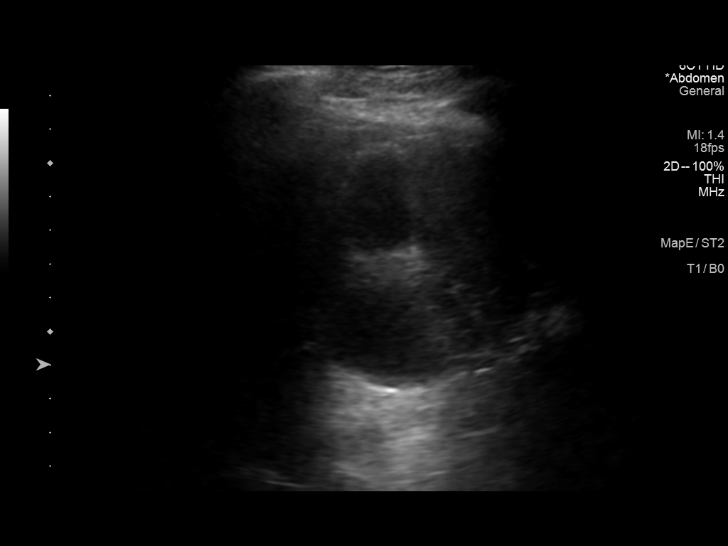
[im 19/41]
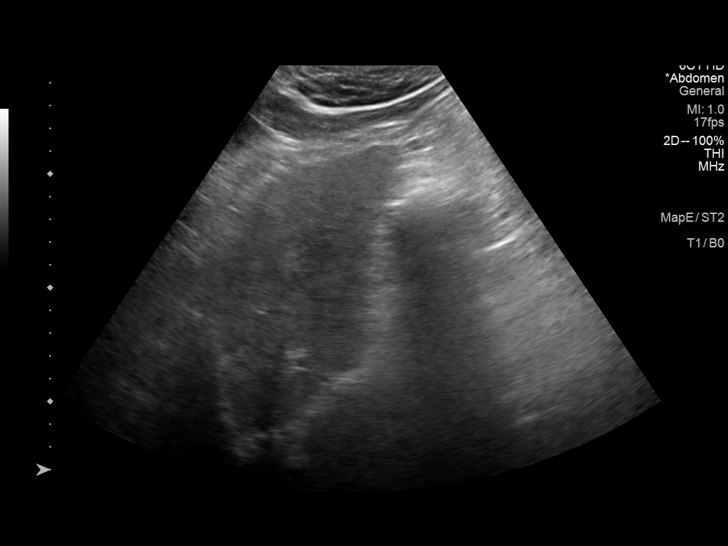
[im 22/41]
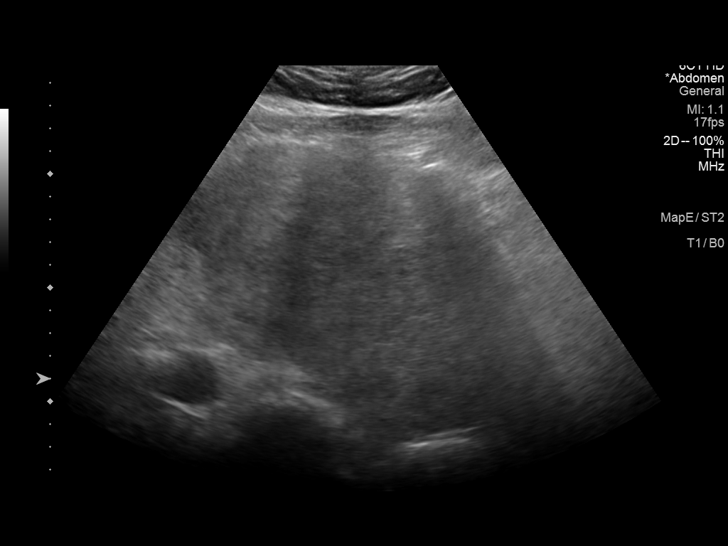
[im 26/41]
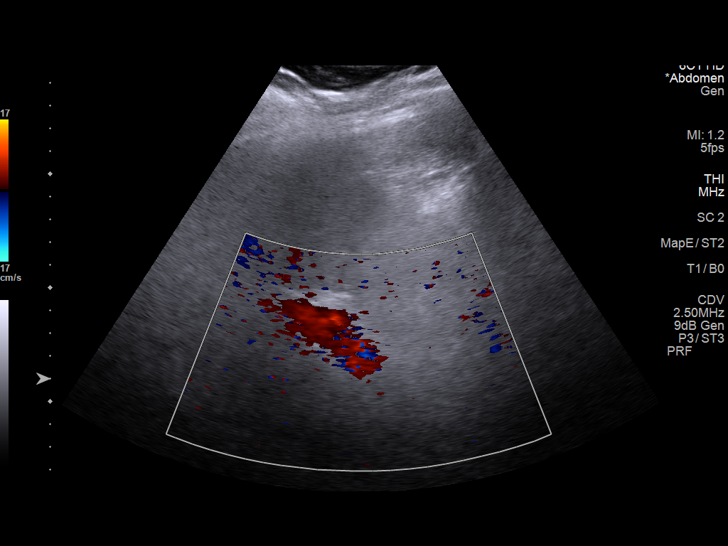
[im 27/41]
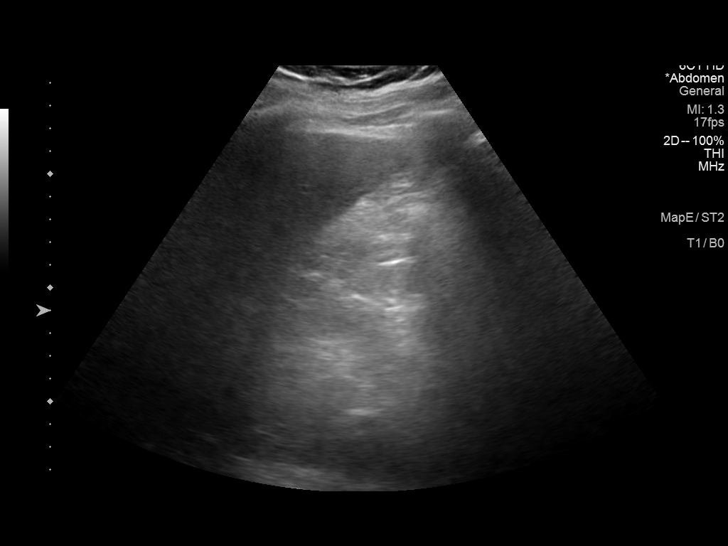
[im 31/41]
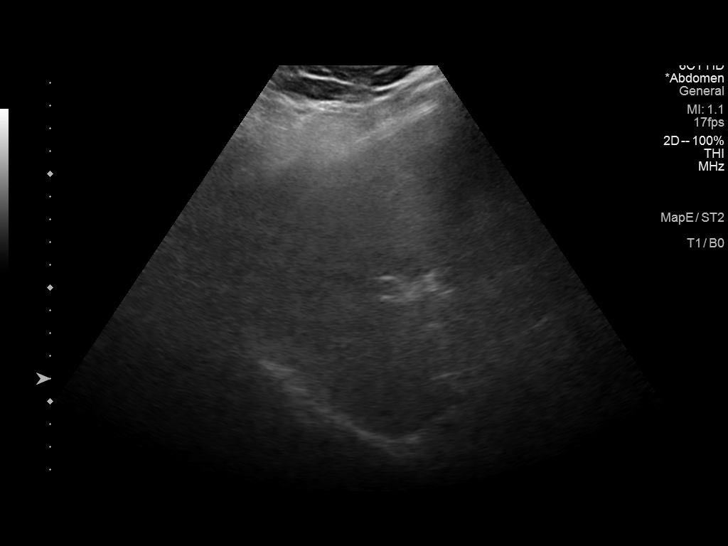
[im 34/41]
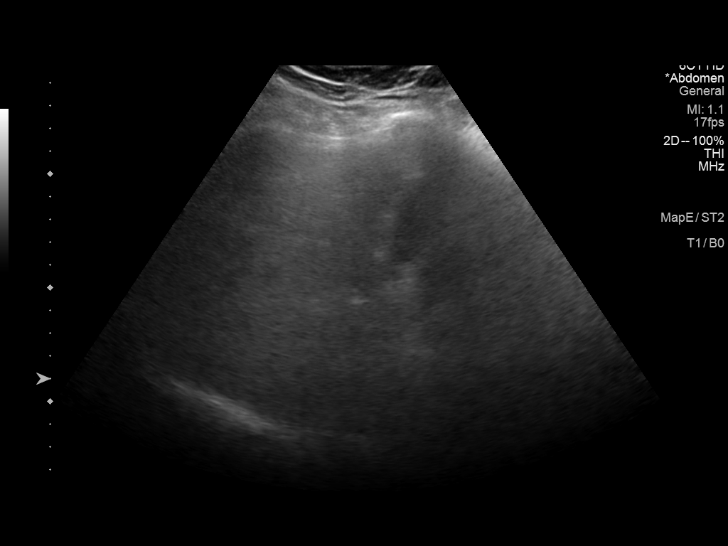
[im 37/41]
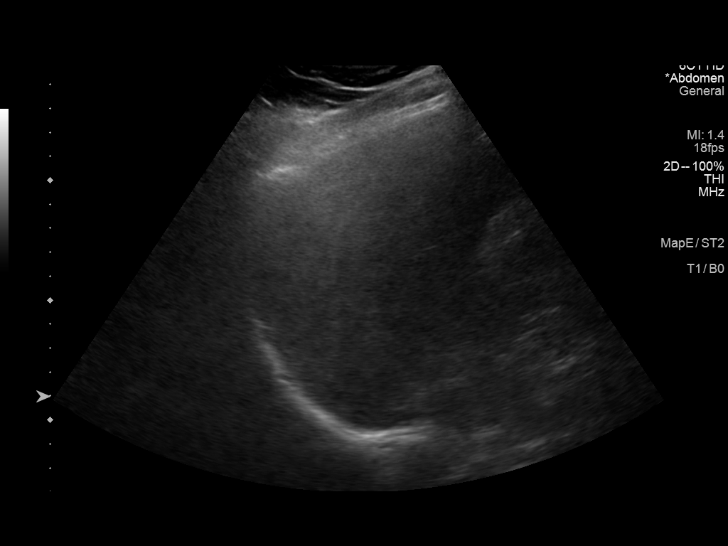
[im 41/41]
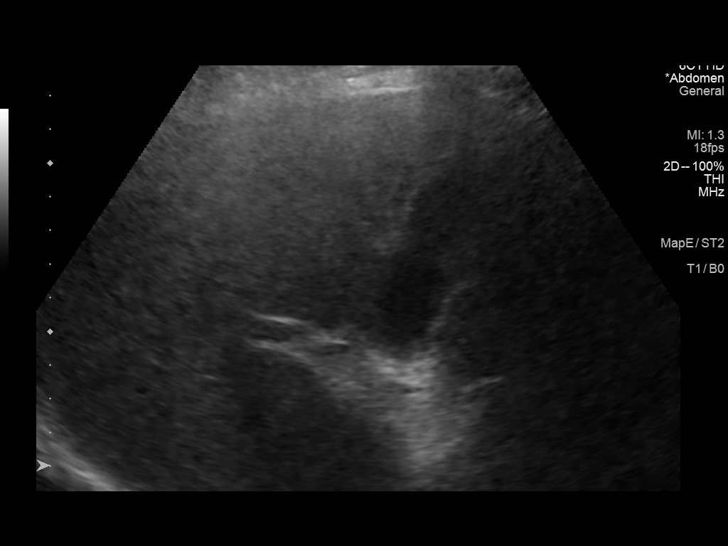

[14 of 25 positions shown; findings below may reference images not displayed]

FINDINGS: Gallbladder:

No gallstones or wall thickening visualized. There is no
pericholecystic fluid. No sonographic Murphy sign noted by
sonographer.

Common bile duct:

Diameter: 5 mm. No intrahepatic or extrahepatic biliary duct
dilatation.

Liver:

No focal lesion identified. Liver echogenicity is increased
diffusely. Portal vein is patent on color Doppler imaging with
normal direction of blood flow towards the liver.
IMPRESSION: Diffuse increase in liver echogenicity, a finding felt to be
indicative of hepatic steatosis, potentially with underlying
parenchymal liver disease. While no focal liver lesions are evident
on this study, it must be cautioned that the sensitivity of
ultrasound for detection of focal liver lesions is diminished
significantly in this circumstance.

Study otherwise unremarkable.

## 2018-11-19 ENCOUNTER — Encounter: Payer: Self-pay | Admitting: Family Medicine

## 2018-11-19 ENCOUNTER — Other Ambulatory Visit: Payer: Self-pay | Admitting: Family Medicine

## 2018-11-19 DIAGNOSIS — K760 Fatty (change of) liver, not elsewhere classified: Secondary | ICD-10-CM | POA: Insufficient documentation

## 2018-12-07 ENCOUNTER — Telehealth: Payer: Self-pay | Admitting: Family Medicine

## 2018-12-07 DIAGNOSIS — Z86010 Personal history of colonic polyps: Secondary | ICD-10-CM | POA: Insufficient documentation

## 2018-12-07 NOTE — Telephone Encounter (Signed)
Patient stated he received a phone call from our office on Friday advising him to call our office back.  C/B # [phone]

## 2018-12-09 NOTE — Telephone Encounter (Signed)
The only thing I see is Tim Hodge called patient about referral. I do not see any other messages.

## 2018-12-16 ENCOUNTER — Telehealth: Payer: Self-pay | Admitting: Family Medicine

## 2018-12-16 ENCOUNTER — Other Ambulatory Visit: Payer: Self-pay | Admitting: Family Medicine

## 2018-12-16 DIAGNOSIS — K760 Fatty (change of) liver, not elsewhere classified: Secondary | ICD-10-CM

## 2018-12-16 DIAGNOSIS — Z1211 Encounter for screening for malignant neoplasm of colon: Secondary | ICD-10-CM

## 2018-12-16 NOTE — Telephone Encounter (Signed)
Error

## 2018-12-23 ENCOUNTER — Encounter: Payer: Self-pay | Admitting: *Deleted

## 2019-02-05 ENCOUNTER — Other Ambulatory Visit: Payer: Self-pay

## 2019-02-05 ENCOUNTER — Encounter: Payer: Self-pay | Admitting: Family Medicine

## 2019-02-05 ENCOUNTER — Ambulatory Visit (INDEPENDENT_AMBULATORY_CARE_PROVIDER_SITE_OTHER): Payer: Medicare Other | Admitting: Family Medicine

## 2019-02-05 VITALS — BP 112/64 | HR 65 | Temp 98.2°F | Ht 72.5 in | Wt 312.2 lb

## 2019-02-05 DIAGNOSIS — R202 Paresthesia of skin: Secondary | ICD-10-CM | POA: Diagnosis not present

## 2019-02-05 DIAGNOSIS — G8929 Other chronic pain: Secondary | ICD-10-CM

## 2019-02-05 DIAGNOSIS — M549 Dorsalgia, unspecified: Secondary | ICD-10-CM

## 2019-02-05 DIAGNOSIS — E119 Type 2 diabetes mellitus without complications: Secondary | ICD-10-CM | POA: Diagnosis not present

## 2019-02-05 DIAGNOSIS — Z23 Encounter for immunization: Secondary | ICD-10-CM | POA: Diagnosis not present

## 2019-02-05 LAB — POCT GLYCOSYLATED HEMOGLOBIN (HGB A1C): Hemoglobin A1C: 8.6 % — AB (ref 4.0–5.6)

## 2019-02-05 NOTE — Patient Instructions (Addendum)
Check with your insurance to see if they will cover the shingrix shot. Try an OTC wrist brace and see if that helps.  Recheck A1c at a visit in about 3 months.  Take care.  Glad to see you.  Thanks for getting a flu shot.

## 2019-02-05 NOTE — Progress Notes (Signed)
a 

## 2019-02-05 NOTE — Progress Notes (Signed)
Diabetes:  Using medications without difficulties: yes Hypoglycemic episodes: no Hyperglycemic episodes:no Feet problems: no tingling.   Blood Sugars averaging: ~150, occ higher eye exam within last year: yes A1c slightly better, d/w pt.  Not at goal but a little lower at 8.6.   He has been working on diet.    He has some L>R hand tingling and d/w pt about trial of otc wrist brace, he'll update me as needed.   Flu shot today.    He is having less of the "bad" days due to pain.  More L knee pain (more than his back) from weather changes.  He has tried icing.  He is putting up with pain in the meantime.  He isn't at the point of wanting to f/u with ortho.    He has colonoscopy pending.   Meds, vitals, and allergies reviewed.   ROS: Per HPI unless specifically indicated in ROS section   GEN: nad, alert and oriented HEENT: ncat NECK: supple w/o LA CV: rrr. PULM: ctab, no inc wob ABD: soft, +bs EXT: no edema SKIN: no acute rash

## 2019-02-07 DIAGNOSIS — R202 Paresthesia of skin: Secondary | ICD-10-CM | POA: Insufficient documentation

## 2019-02-07 NOTE — Assessment & Plan Note (Addendum)
He is having less of the "bad" days due to pain.  More L knee pain (more than his back) from weather changes.  He has tried icing.  He is putting up with pain in the meantime.  He isn't at the point of wanting to f/u with ortho.   He will update me as needed.

## 2019-02-07 NOTE — Assessment & Plan Note (Signed)
He has some L>R hand tingling and d/w pt about trial of otc wrist brace, he'll update me as needed.  He agrees with plan.

## 2019-02-07 NOTE — Assessment & Plan Note (Signed)
A1c slightly better, d/w pt.  Not at goal but a little lower at 8.6.   He has been working on diet.   Continue working on diet in the meantime and recheck periodically.  He agrees.

## 2019-02-11 ENCOUNTER — Other Ambulatory Visit: Admission: RE | Admit: 2019-02-11 | Payer: BC Managed Care – PPO | Source: Ambulatory Visit

## 2019-02-28 ENCOUNTER — Telehealth: Payer: Self-pay | Admitting: Family Medicine

## 2019-02-28 NOTE — Telephone Encounter (Signed)
Call patient about his Xarelto use with colonoscopy scheduled for later this year.  I think it makes sense to skip his Xarelto the day before and the day of the procedure.  This means he would skip 2 doses of medication.  Assuming he did not have any bleeding or complication, he would restart the medication the day after the procedure.  Thanks.

## 2019-03-01 NOTE — Telephone Encounter (Signed)
Wife advised and repeated instructions correctly.  DPR

## 2019-03-12 ENCOUNTER — Telehealth: Payer: Self-pay

## 2019-03-12 MED ORDER — BENZONATATE 200 MG PO CAPS: 200.0000 mg | ORAL_CAPSULE | Freq: Three times a day (TID) | ORAL | 1 refills | Status: DC | PRN

## 2019-03-12 MED ORDER — ONDANSETRON HCL 4 MG PO TABS: 4.0000 mg | ORAL_TABLET | Freq: Three times a day (TID) | ORAL | 0 refills | Status: DC | PRN

## 2019-03-12 NOTE — Telephone Encounter (Signed)
Patient's wife called stating she was tested positive for COVID on 03/08/2019-she had symptoms for 1 week prior to testing. Patient is now having belching, stomach upset feeling, bad headache, bad body aches, temp now is 99, nasal congestion, cough is slight off and on, drained feeling. He has taking Dayquil or Nyquil but nothing else for the pain. NO SOB no chest pain or tightness. Patient would like to know what can he take and any other suggestions for the patient?

## 2019-03-12 NOTE — Telephone Encounter (Addendum)
I assume he is COVID positive even w/o testing.   We should treat him as positive given the close contact.  Even if he tested neg, he would still need to quarantine.   Please add him to the follow up list.  I hope he feels better soon.  Would take tylenol for aches.   Rest and fluids in the meantime.  Would try tessalon if needed for cough.  zofran if needed for nausea.   rx sent for both.   Update [place] as needed.  Please give him routine ER cautions.   Thanks.

## 2019-03-12 NOTE — Telephone Encounter (Signed)
Patient advised.

## 2019-04-19 ENCOUNTER — Other Ambulatory Visit: Payer: BC Managed Care – PPO

## 2019-04-21 ENCOUNTER — Ambulatory Visit: Admission: RE | Admit: 2019-04-21 | Payer: Medicare Other | Source: Home / Self Care | Admitting: Internal Medicine

## 2019-04-21 ENCOUNTER — Encounter: Admission: RE | Payer: Self-pay | Source: Home / Self Care

## 2019-04-21 SURGERY — COLONOSCOPY WITH PROPOFOL
Anesthesia: General

## 2019-04-23 HISTORY — PX: KYPHOPLASTY: SHX5884

## 2019-05-10 ENCOUNTER — Encounter: Payer: Self-pay | Admitting: Family Medicine

## 2019-05-10 ENCOUNTER — Ambulatory Visit (INDEPENDENT_AMBULATORY_CARE_PROVIDER_SITE_OTHER)
Admission: RE | Admit: 2019-05-10 | Discharge: 2019-05-10 | Disposition: A | Discharge status: Home / Self Care | Payer: Medicare Other | Source: Ambulatory Visit | Attending: Family Medicine | Admitting: Family Medicine

## 2019-05-10 ENCOUNTER — Other Ambulatory Visit: Payer: Self-pay

## 2019-05-10 ENCOUNTER — Ambulatory Visit (INDEPENDENT_AMBULATORY_CARE_PROVIDER_SITE_OTHER): Payer: Medicare Other | Admitting: Family Medicine

## 2019-05-10 VITALS — BP 122/64 | HR 79 | Temp 94.2°F | Ht 72.0 in | Wt 302.6 lb

## 2019-05-10 DIAGNOSIS — G8929 Other chronic pain: Secondary | ICD-10-CM | POA: Diagnosis not present

## 2019-05-10 DIAGNOSIS — M545 Low back pain, unspecified: Secondary | ICD-10-CM

## 2019-05-10 DIAGNOSIS — K068 Other specified disorders of gingiva and edentulous alveolar ridge: Secondary | ICD-10-CM | POA: Diagnosis not present

## 2019-05-10 DIAGNOSIS — E119 Type 2 diabetes mellitus without complications: Secondary | ICD-10-CM

## 2019-05-10 DIAGNOSIS — Z86711 Personal history of pulmonary embolism: Secondary | ICD-10-CM

## 2019-05-10 DIAGNOSIS — M549 Dorsalgia, unspecified: Secondary | ICD-10-CM

## 2019-05-10 LAB — CBC WITH DIFFERENTIAL/PLATELET
Basophils Absolute: 0 10*3/uL (ref 0.0–0.1)
Basophils Relative: 0.9 % (ref 0.0–3.0)
Eosinophils Absolute: 0.2 10*3/uL (ref 0.0–0.7)
Eosinophils Relative: 4.1 % (ref 0.0–5.0)
HCT: 42.7 % (ref 39.0–52.0)
Hemoglobin: 14.3 g/dL (ref 13.0–17.0)
Lymphocytes Relative: 28.3 % (ref 12.0–46.0)
Lymphs Abs: 1.2 10*3/uL (ref 0.7–4.0)
MCHC: 33.4 g/dL (ref 30.0–36.0)
MCV: 95.2 fl (ref 78.0–100.0)
Monocytes Absolute: 0.5 10*3/uL (ref 0.1–1.0)
Monocytes Relative: 10.7 % (ref 3.0–12.0)
Neutro Abs: 2.4 10*3/uL (ref 1.4–7.7)
Neutrophils Relative %: 56 % (ref 43.0–77.0)
Platelets: 143 10*3/uL — ABNORMAL LOW (ref 150.0–400.0)
RBC: 4.48 Mil/uL (ref 4.22–5.81)
RDW: 14.7 % (ref 11.5–15.5)
WBC: 4.2 10*3/uL (ref 4.0–10.5)

## 2019-05-10 LAB — HEMOGLOBIN A1C: Hgb A1c MFr Bld: 6.8 % — ABNORMAL HIGH (ref 4.6–6.5)

## 2019-05-10 IMAGING — DX DG LUMBAR SPINE COMPLETE 4+V
5 series · 5 of 5 positions shown · non-contrast
Comparison: [DATE], [DATE]

CLINICAL DATA: Back pain

EXAM:
LUMBAR SPINE - COMPLETE 4+ VIEW

[l-spine ap]
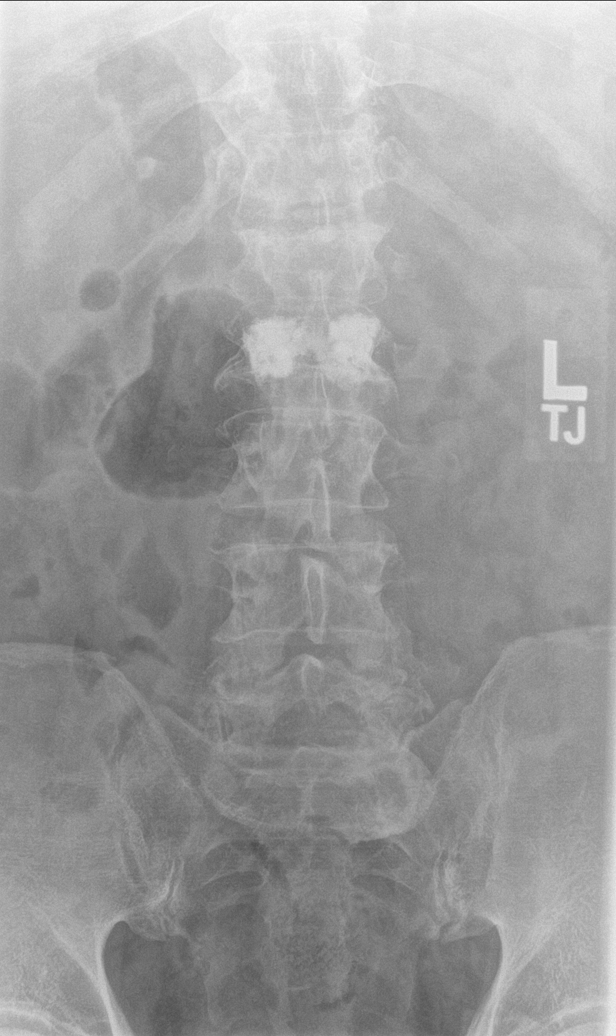

[l-spine obl (1 of 2)]
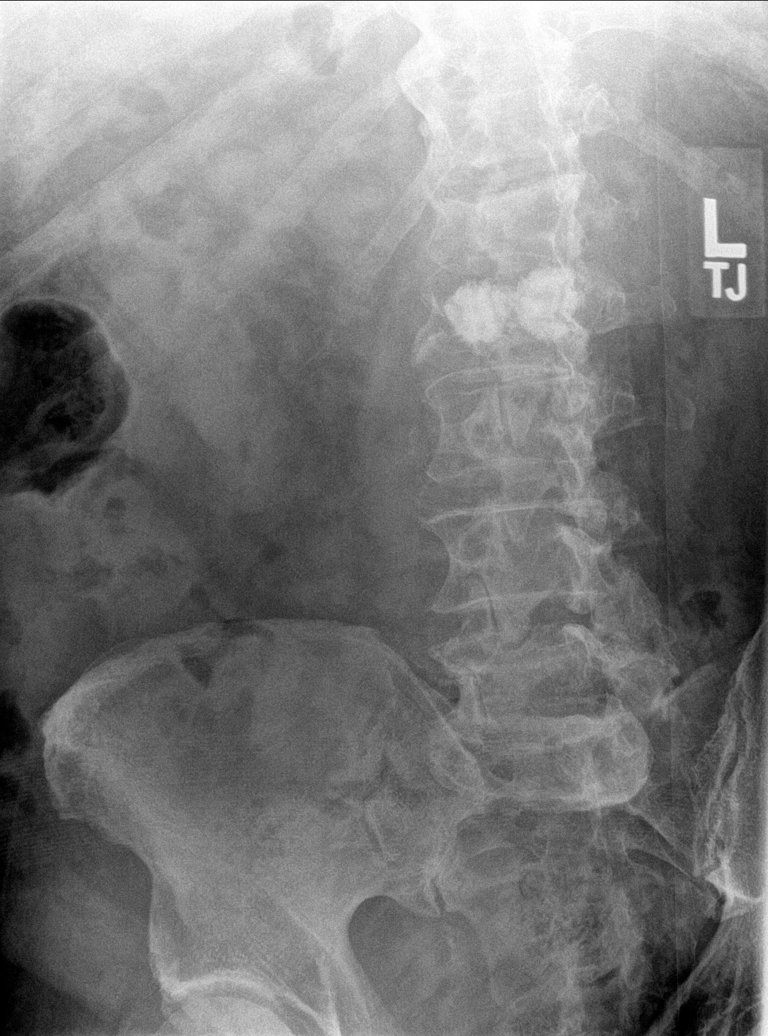

[l-spine obl (2 of 2)]
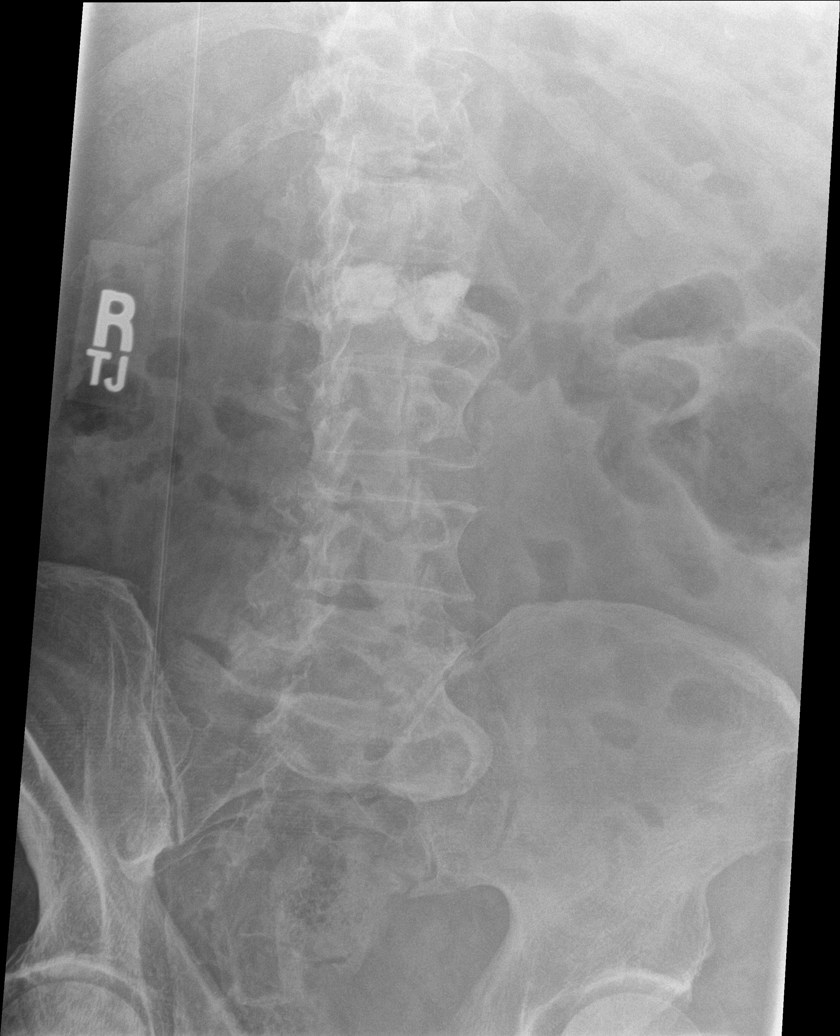

[l-spine lat]
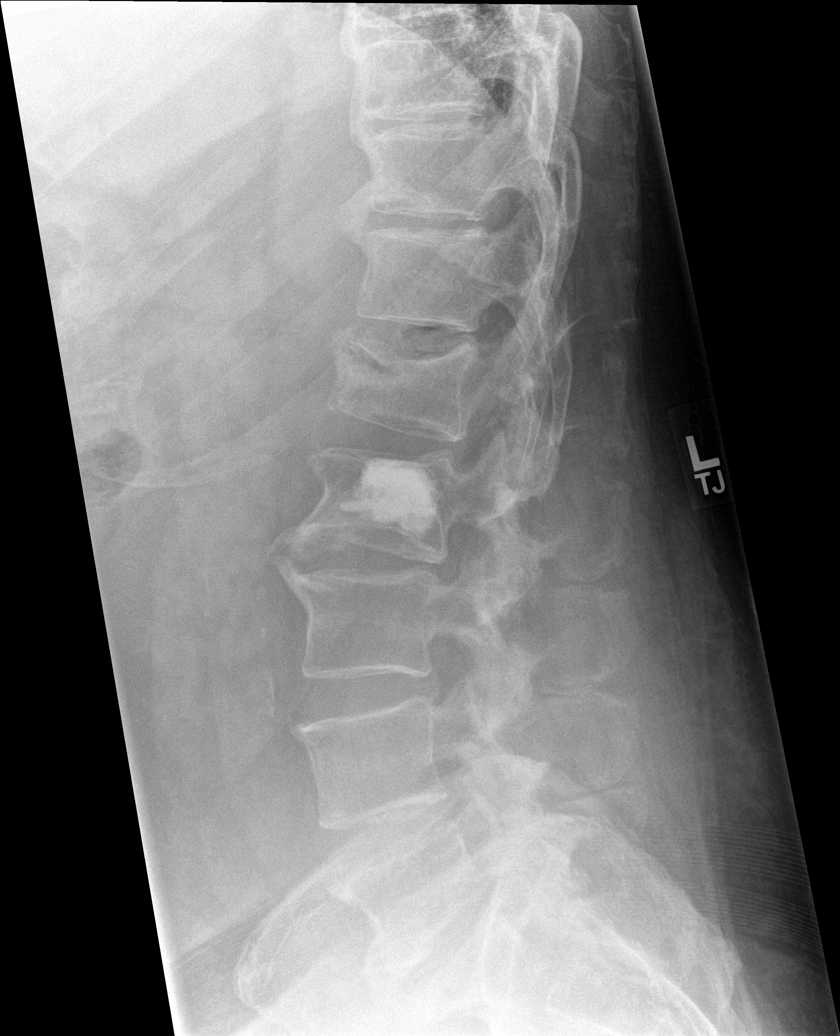

[l-spine l5/s1]
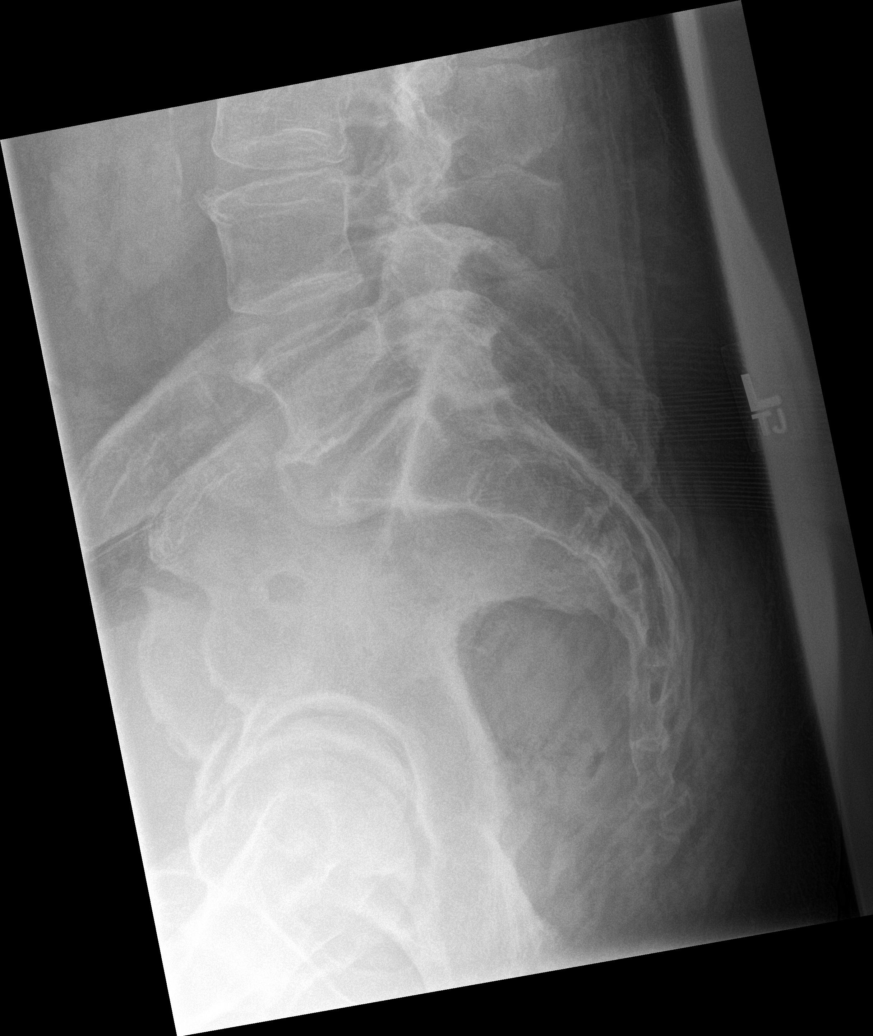

[5 of 5 positions shown; findings below may reference images not displayed]

FINDINGS: There are changes consistent with prior kyphoplasty at L2 similar to
that noted on the previous exam. L1 compression deformity is noted
with evidence of mild osteonecrosis in the fracture line consistent
with a more chronic process. This is new however from the prior exam
of [YO]. No other height loss is seen. Osteophytic changes and facet
hypertrophic changes are noted. No soft tissue changes are seen.
IMPRESSION: Changes consistent with prior L2 kyphoplasty.

Compression deformity at L1 with evidence of osteonecrosis
consistent with a more chronic process. This is new from the prior

Multilevel degenerative change.

## 2019-05-10 NOTE — Progress Notes (Signed)
This visit occurred during the SARS-CoV-2 public health emergency.  Safety protocols were in place, including screening questions prior to the visit, additional usage of staff PPE, and extensive cleaning of exam room while observing appropriate contact time as indicated for disinfecting solutions.  Diabetes:  Using medications without difficulties: yes Hypoglycemic episodes: no sx  Hyperglycemic episodes: no sx Feet problems: no Blood Sugars averaging: not checked recently.  eye exam within last year: due, d/w pt.   A1c pending.    He is going to call about seeing GI.    Still anticoagulated.  He had gum bleeding with dental cleaning and occ o/w.  No blood in stool, urine.  Not bruising o/w.    His wife and family members tested positive.  They got better in about 5 days but he was sick for about 3 weeks.  He is better in the meantime.  We talked about waiting for about 3 months from the onset of sx to get vaccinated, ie mid 02/2019---->05/2019.  His taste is coming back.  That was prev altered.   He is still having back pain, after he fell last fall.  More pain sitting/laying down.  Less pain walking.  He has lower back pain, radiating to the B buttocks.  He has been trying to walk some.  Still on gabapentin.  Pain doesn't shoot down his legs.    Meds, vitals, and allergies reviewed.  ROS: Per HPI unless specifically indicated in ROS section   GEN: nad, alert and oriented HEENT: ncat, OP wnl, no bleeding.  NECK: supple w/o LA CV: rrr. PULM: ctab, no inc wob ABD: soft, +bs EXT: no edema SKIN: no acute rash Lower midline and L lower back ttp w/o rash.  Muscle spasm locally.  S/S wnl BLE on gross exam.

## 2019-05-10 NOTE — Assessment & Plan Note (Signed)
Recheck imaging today, see notes on plain films.  No emergent sx at this point but bothered by pain.

## 2019-05-10 NOTE — Assessment & Plan Note (Signed)
Check cbc.  Still on anticoagulation w/o other bleeding. Still okay for outpatient f/u.

## 2019-05-10 NOTE — Patient Instructions (Addendum)
[email or web address]  Go to the lab on the way out.  We'll contact you with your lab and xray report.    Let me know before you have a colonoscopy so we can make plans about your blood thinner.   Take care.  Glad to see you.  Plan on recheck in 6 months at a physical with labs ahead of time- we may need to meet in 3 months about your sugar.

## 2019-05-10 NOTE — Assessment & Plan Note (Signed)
No change in meds at this point, check A1c,  See notes on labs.  See avs.

## 2019-05-18 ENCOUNTER — Telehealth: Payer: Self-pay | Admitting: Family Medicine

## 2019-05-18 DIAGNOSIS — G8929 Other chronic pain: Secondary | ICD-10-CM

## 2019-05-18 DIAGNOSIS — M545 Low back pain, unspecified: Secondary | ICD-10-CM

## 2019-05-18 NOTE — Telephone Encounter (Signed)
Patient called and wants to go to the [facility] clinic. He doesn't want to see Dr Lynann Bologna. Please place the referral.

## 2019-05-19 NOTE — Telephone Encounter (Signed)
Ordered. Thanks

## 2019-05-20 NOTE — Telephone Encounter (Signed)
Appt scheduled with Dr Earnie Larsson for today 05/20/2019 at 3pm. Patient notified.

## 2019-05-31 ENCOUNTER — Other Ambulatory Visit: Payer: Self-pay | Admitting: Neurosurgery

## 2019-05-31 DIAGNOSIS — S32010A Wedge compression fracture of first lumbar vertebra, initial encounter for closed fracture: Secondary | ICD-10-CM

## 2019-10-08 ENCOUNTER — Telehealth: Payer: Self-pay

## 2019-10-08 MED ORDER — LISINOPRIL 5 MG PO TABS: 5.0000 mg | ORAL_TABLET | Freq: Every day | ORAL | 5 refills | Status: DC

## 2019-10-08 NOTE — Telephone Encounter (Signed)
Patient has been cutting the Lisinopril 10 mg in half and asks that we send in a Rx for 5 mg tablets.  Rx sent to [facility] as requested.

## 2019-10-08 NOTE — Telephone Encounter (Signed)
Sharyn Lull from [facility] left a message on triage line stating pt came in the pharmacy asking for a refill of lisinopril 5mg . They do not have lisinopril on his profile.   Before sending this back to [facility], I looked at his med list and he had lisinopril 10mg  that was sent to [facility] last year.  Please advise.

## 2019-10-25 ENCOUNTER — Other Ambulatory Visit: Payer: Self-pay | Admitting: Family Medicine

## 2019-10-25 DIAGNOSIS — E119 Type 2 diabetes mellitus without complications: Secondary | ICD-10-CM

## 2019-11-02 ENCOUNTER — Other Ambulatory Visit (INDEPENDENT_AMBULATORY_CARE_PROVIDER_SITE_OTHER): Payer: Medicare Other

## 2019-11-02 ENCOUNTER — Other Ambulatory Visit: Payer: Self-pay

## 2019-11-02 DIAGNOSIS — E119 Type 2 diabetes mellitus without complications: Secondary | ICD-10-CM

## 2019-11-02 LAB — COMPREHENSIVE METABOLIC PANEL
ALT: 34 U/L (ref 0–53)
AST: 39 U/L — ABNORMAL HIGH (ref 0–37)
Albumin: 3.5 g/dL (ref 3.5–5.2)
Alkaline Phosphatase: 147 U/L — ABNORMAL HIGH (ref 39–117)
BUN: 14 mg/dL (ref 6–23)
CO2: 28 mEq/L (ref 19–32)
Calcium: 8.8 mg/dL (ref 8.4–10.5)
Chloride: 102 mEq/L (ref 96–112)
Creatinine, Ser: 0.94 mg/dL (ref 0.40–1.50)
GFR: 80.94 mL/min (ref >60.00)
Glucose, Bld: 288 mg/dL — ABNORMAL HIGH (ref 70–99)
Potassium: 4.3 mEq/L (ref 3.5–5.1)
Sodium: 136 mEq/L (ref 135–145)
Total Bilirubin: 1.4 mg/dL — ABNORMAL HIGH (ref 0.2–1.2)
Total Protein: 6.2 g/dL (ref 6.0–8.3)

## 2019-11-02 LAB — CBC WITH DIFFERENTIAL/PLATELET
Basophils Absolute: 0 10*3/uL (ref 0.0–0.1)
Basophils Relative: 0.6 % (ref 0.0–3.0)
Eosinophils Absolute: 0.2 10*3/uL (ref 0.0–0.7)
Eosinophils Relative: 4 % (ref 0.0–5.0)
HCT: 40.8 % (ref 39.0–52.0)
Hemoglobin: 13.9 g/dL (ref 13.0–17.0)
Lymphocytes Relative: 26.8 % (ref 12.0–46.0)
Lymphs Abs: 1.3 10*3/uL (ref 0.7–4.0)
MCHC: 34.1 g/dL (ref 30.0–36.0)
MCV: 95.8 fl (ref 78.0–100.0)
Monocytes Absolute: 0.6 10*3/uL (ref 0.1–1.0)
Monocytes Relative: 11.9 % (ref 3.0–12.0)
Neutro Abs: 2.8 10*3/uL (ref 1.4–7.7)
Neutrophils Relative %: 56.7 % (ref 43.0–77.0)
Platelets: 115 10*3/uL — ABNORMAL LOW (ref 150.0–400.0)
RBC: 4.26 Mil/uL (ref 4.22–5.81)
RDW: 13.7 % (ref 11.5–15.5)
WBC: 4.9 10*3/uL (ref 4.0–10.5)

## 2019-11-02 LAB — LIPID PANEL
Cholesterol: 157 mg/dL (ref 0–200)
HDL: 41.6 mg/dL (ref >39.00)
LDL Cholesterol: 86 mg/dL (ref 0–99)
NonHDL: 115.39
Total CHOL/HDL Ratio: 4
Triglycerides: 145 mg/dL (ref 0.0–149.0)
VLDL: 29 mg/dL (ref 0.0–40.0)

## 2019-11-02 LAB — HEMOGLOBIN A1C: Hgb A1c MFr Bld: 8.3 % — ABNORMAL HIGH (ref 4.6–6.5)

## 2019-11-08 ENCOUNTER — Encounter: Payer: Self-pay | Admitting: Family Medicine

## 2019-11-08 ENCOUNTER — Ambulatory Visit (INDEPENDENT_AMBULATORY_CARE_PROVIDER_SITE_OTHER): Payer: BC Managed Care – PPO | Admitting: Family Medicine

## 2019-11-08 ENCOUNTER — Other Ambulatory Visit: Payer: Self-pay

## 2019-11-08 ENCOUNTER — Other Ambulatory Visit
Admission: RE | Admit: 2019-11-08 | Discharge: 2019-11-08 | Disposition: A | Discharge status: Home / Self Care | Payer: Medicare Other | Source: Ambulatory Visit | Attending: Internal Medicine | Admitting: Internal Medicine

## 2019-11-08 VITALS — BP 122/74 | HR 76 | Temp 96.9°F | Ht 72.0 in | Wt 312.3 lb

## 2019-11-08 DIAGNOSIS — M549 Dorsalgia, unspecified: Secondary | ICD-10-CM

## 2019-11-08 DIAGNOSIS — Z01812 Encounter for preprocedural laboratory examination: Secondary | ICD-10-CM | POA: Insufficient documentation

## 2019-11-08 DIAGNOSIS — Z20822 Contact with and (suspected) exposure to covid-19: Secondary | ICD-10-CM | POA: Insufficient documentation

## 2019-11-08 DIAGNOSIS — Z7189 Other specified counseling: Secondary | ICD-10-CM

## 2019-11-08 DIAGNOSIS — E119 Type 2 diabetes mellitus without complications: Secondary | ICD-10-CM | POA: Diagnosis not present

## 2019-11-08 DIAGNOSIS — R7989 Other specified abnormal findings of blood chemistry: Secondary | ICD-10-CM

## 2019-11-08 DIAGNOSIS — Z0000 Encounter for general adult medical examination without abnormal findings: Secondary | ICD-10-CM | POA: Diagnosis not present

## 2019-11-08 DIAGNOSIS — Z0001 Encounter for general adult medical examination with abnormal findings: Secondary | ICD-10-CM

## 2019-11-08 DIAGNOSIS — Z86711 Personal history of pulmonary embolism: Secondary | ICD-10-CM

## 2019-11-08 DIAGNOSIS — D696 Thrombocytopenia, unspecified: Secondary | ICD-10-CM

## 2019-11-08 DIAGNOSIS — G8929 Other chronic pain: Secondary | ICD-10-CM | POA: Diagnosis not present

## 2019-11-08 LAB — SARS CORONAVIRUS 2 (TAT 6-24 HRS): SARS Coronavirus 2: NEGATIVE

## 2019-11-08 MED ORDER — GLIMEPIRIDE 4 MG PO TABS: ORAL_TABLET | ORAL | 5 refills | Status: DC

## 2019-11-08 MED ORDER — GABAPENTIN 300 MG PO CAPS: 300.0000 mg | ORAL_CAPSULE | Freq: Three times a day (TID) | ORAL | 5 refills | Status: DC

## 2019-11-08 NOTE — Progress Notes (Signed)
This visit occurred during the SARS-CoV-2 public health emergency.  Safety protocols were in place, including screening questions prior to the visit, additional usage of staff PPE, and extensive cleaning of exam room while observing appropriate contact time as indicated for disinfecting solutions.  CPE- See plan.  Routine anticipatory guidance given to patient.  See health maintenance.  The possibility exists that previously documented standard health maintenance information may have been brought forward from a previous encounter into this note.  If needed, that same information has been updated to reflect the current situation based on today's encounter.    Tetanus 2016 Flu yearly  PNA vaccine up to date.  Shingles d/w pt.   He had his moderna covid vaccine.  EMR updated.  Colonoscopy pending 2021.   Prostate cancer screening and PSA options(with potential risks and benefits of testing vs not testing) were discussed along with recent recs/guidelines. He declined testing PSAat this point. Living will d/w pt. Wife designated if patient were incapacitated.   Diabetes:  Using medications without difficulties: yes, still on januvia and glimepiride.   Hypoglycemic episodes:no Hyperglycemic episodes:no Feet problems: no Blood Sugars averaging: 140-160s eye exam within last year: fu pending. Would defer statin at this point, until he can get other issues resolved, see below.  D/w pt.      His back pain is better after repeat kyphoplasty this year.  Still on gabapentin, sometimes with more relief than others. Noted more pain with weather changes but tolerable as is.  He'll update me as needed.    Lower PLT.  H/o fatty liver. No bleeding.    LFT elevation.  H/o fatty liver.  D/w pt about diet and exercise.  D/w pt about avoiding tylenol and etoh.    We talked about anticoagulation, held x3 days for colonoscopy.  No bleeding.  No ade on xarelto.    PMH and SH reviewed  Meds, vitals, and  allergies reviewed.   ROS: Per HPI.  Unless specifically indicated otherwise in HPI, the patient denies:  General: fever. Eyes: acute vision changes ENT: sore throat Cardiovascular: chest pain Respiratory: SOB GI: vomiting GU: dysuria Musculoskeletal: acute back pain Derm: acute rash Neuro: acute motor dysfunction Psych: worsening mood Endocrine: polydipsia Heme: bleeding Allergy: hayfever  GEN: nad, alert and oriented HEENT: ncat NECK: supple w/o LA CV: rrr. PULM: ctab, no inc wob ABD: soft, +bs EXT: no edema SKIN: no acute rash  Diabetic foot exam: Normal inspection No skin breakdown No calluses  Normal DP pulses Normal sensation to light touch and monofilament Nails normal

## 2019-11-08 NOTE — Patient Instructions (Signed)
Don't change your meds for now.  Thanks for getting vaccinate.  I'll await the notes from the eye and GI clinics.  Recheck in about 4 months, A1c at the visit.  You don't have to fast.  Keep working on diet and exercise.  Take care.  Glad to see you.

## 2019-11-10 ENCOUNTER — Ambulatory Visit: Payer: Medicare Other | Admitting: Certified Registered"

## 2019-11-10 ENCOUNTER — Encounter: Payer: Self-pay | Admitting: Internal Medicine

## 2019-11-10 ENCOUNTER — Encounter
Admission: RE | Disposition: A | Discharge status: Home / Self Care | Payer: Self-pay | Source: Home / Self Care | Attending: Internal Medicine

## 2019-11-10 ENCOUNTER — Ambulatory Visit
Admission: RE | Admit: 2019-11-10 | Discharge: 2019-11-10 | Disposition: A | Discharge status: Home / Self Care | Payer: Medicare Other | Attending: Internal Medicine | Admitting: Internal Medicine

## 2019-11-10 DIAGNOSIS — K5730 Diverticulosis of large intestine without perforation or abscess without bleeding: Secondary | ICD-10-CM | POA: Diagnosis not present

## 2019-11-10 DIAGNOSIS — E119 Type 2 diabetes mellitus without complications: Secondary | ICD-10-CM | POA: Diagnosis not present

## 2019-11-10 DIAGNOSIS — Z79899 Other long term (current) drug therapy: Secondary | ICD-10-CM | POA: Insufficient documentation

## 2019-11-10 DIAGNOSIS — I10 Essential (primary) hypertension: Secondary | ICD-10-CM | POA: Diagnosis not present

## 2019-11-10 DIAGNOSIS — Z86711 Personal history of pulmonary embolism: Secondary | ICD-10-CM | POA: Insufficient documentation

## 2019-11-10 DIAGNOSIS — K635 Polyp of colon: Secondary | ICD-10-CM | POA: Diagnosis not present

## 2019-11-10 DIAGNOSIS — Z7901 Long term (current) use of anticoagulants: Secondary | ICD-10-CM | POA: Diagnosis not present

## 2019-11-10 DIAGNOSIS — Z87891 Personal history of nicotine dependence: Secondary | ICD-10-CM | POA: Diagnosis not present

## 2019-11-10 DIAGNOSIS — Z1211 Encounter for screening for malignant neoplasm of colon: Secondary | ICD-10-CM | POA: Insufficient documentation

## 2019-11-10 DIAGNOSIS — K640 First degree hemorrhoids: Secondary | ICD-10-CM | POA: Diagnosis not present

## 2019-11-10 DIAGNOSIS — Z7984 Long term (current) use of oral hypoglycemic drugs: Secondary | ICD-10-CM | POA: Insufficient documentation

## 2019-11-10 DIAGNOSIS — Z86010 Personal history of colonic polyps: Secondary | ICD-10-CM | POA: Diagnosis not present

## 2019-11-10 DIAGNOSIS — D696 Thrombocytopenia, unspecified: Secondary | ICD-10-CM | POA: Insufficient documentation

## 2019-11-10 HISTORY — PX: COLONOSCOPY WITH PROPOFOL: SHX5780

## 2019-11-10 LAB — GLUCOSE, CAPILLARY: Glucose-Capillary: 95 mg/dL (ref 70–99)

## 2019-11-10 SURGERY — COLONOSCOPY WITH PROPOFOL
Anesthesia: General

## 2019-11-10 MED ORDER — SODIUM CHLORIDE 0.9 % IV SOLN: INTRAVENOUS | Status: DC

## 2019-11-10 MED ORDER — ESMOLOL HCL 100 MG/10ML IV SOLN
INTRAVENOUS | Status: AC
  Filled 2019-11-10: qty 10

## 2019-11-10 MED ORDER — LIDOCAINE HCL (CARDIAC) PF 100 MG/5ML IV SOSY
PREFILLED_SYRINGE | INTRAVENOUS | Status: DC | PRN
  Administered 2019-11-10: 100 mg via INTRAVENOUS

## 2019-11-10 MED ORDER — MIDAZOLAM HCL 2 MG/2ML IJ SOLN
INTRAMUSCULAR | Status: AC
  Filled 2019-11-10: qty 2

## 2019-11-10 MED ORDER — PROPOFOL 500 MG/50ML IV EMUL
INTRAVENOUS | Status: DC | PRN
  Administered 2019-11-10: 165 ug/kg/min via INTRAVENOUS

## 2019-11-10 MED ORDER — PROPOFOL 10 MG/ML IV BOLUS
INTRAVENOUS | Status: DC | PRN
  Administered 2019-11-10: 50 mg via INTRAVENOUS
  Administered 2019-11-10: 10 mg via INTRAVENOUS

## 2019-11-10 MED ORDER — GLYCOPYRROLATE 0.2 MG/ML IJ SOLN
INTRAMUSCULAR | Status: DC | PRN
  Administered 2019-11-10: .2 mg via INTRAVENOUS

## 2019-11-10 NOTE — Assessment & Plan Note (Signed)
Living will d/w pt.  Wife designated if patient were incapacitated.   ?

## 2019-11-10 NOTE — Transfer of Care (Signed)
Immediate Anesthesia Transfer of Care Note  Patient: Tim Hodge  Procedure(s) Performed: COLONOSCOPY WITH PROPOFOL (N/A )  Patient Location: Endoscopy Unit  Anesthesia Type:General  Level of Consciousness: awake, drowsy and patient cooperative  Airway & Oxygen Therapy: Patient Spontanous Breathing and Patient connected to face mask oxygen  Post-op Assessment: Report given to RN and Post -op Vital signs reviewed and stable  Post vital signs: Reviewed and stable  Last Vitals:  Vitals Value Taken Time  BP 97/78 11/10/19 1325  Temp 36.1 C 11/10/19 1325  Pulse 107 11/10/19 1327  Resp 24 11/10/19 1327  SpO2 97 % 11/10/19 1327  Vitals shown include unvalidated device data.  Last Pain:  Vitals:   11/10/19 1325  TempSrc:   PainSc: Asleep         Complications: No complications documented.

## 2019-11-10 NOTE — Interval H&P Note (Signed)
History and Physical Interval Note:  11/10/2019 11:57 AM  Tim Hodge  has presented today for surgery, with the diagnosis of PERSONAL HX.OF COLON POLYPS.  The various methods of treatment have been discussed with the patient and family. After consideration of risks, benefits and other options for treatment, the patient has consented to  Procedure(s): COLONOSCOPY WITH PROPOFOL (N/A) as a surgical intervention.  The patient's history has been reviewed, patient examined, no change in status, stable for surgery.  I have reviewed the patient's chart and labs.  Questions were answered to the patient's satisfaction.     [place], [place]

## 2019-11-10 NOTE — Op Note (Signed)
[facility] Gastroenterology Patient Name: Tim Hodge Procedure Date: 11/10/2019 12:58 PM MRN: [id number] Account #: [id number] Date of Birth: January 11, 1956 Admit Type: Outpatient Age: 64 Room: [facility] ENDO ROOM 3 Gender: Male Note Status: Finalized Procedure:             Colonoscopy Indications:           Surveillance: Personal history of adenomatous polyps                         on last colonoscopy > 5 years ago Providers:             Lorie Apley K. Alice Reichert MD, MD Referring MD:          Elveria Rising. Damita Dunnings, MD (Referring MD) Complications:         No immediate complications. Procedure:             Pre-Anesthesia Assessment:                        - The risks and benefits of the procedure and the                         sedation options and risks were discussed with the                         patient. All questions were answered and informed                         consent was obtained.                        - Patient identification and proposed procedure were                         verified prior to the procedure by the nurse. The                         procedure was verified in the procedure room.                        - ASA Grade Assessment: III - A patient with severe                         systemic disease.                        - After reviewing the risks and benefits, the patient                         was deemed in satisfactory condition to undergo the                         procedure.                        After obtaining informed consent, the colonoscope was                         passed under direct vision. Throughout the procedure,  the patient's blood pressure, pulse, and oxygen                         saturations were monitored continuously. The                         Colonoscope was introduced through the anus and                         advanced to the the cecum, identified by appendiceal                         orifice  and ileocecal valve. The colonoscopy was                         performed without difficulty. The patient tolerated                         the procedure well. The quality of the bowel                         preparation was adequate. The ileocecal valve,                         appendiceal orifice, and rectum were photographed. Findings:      Many small and large-mouthed diverticula were found in the sigmoid colon.      A 9 mm polyp was found in the distal sigmoid colon. The polyp was       pedunculated. The polyp was removed with a hot snare. Resection and       retrieval were complete. Estimated blood loss: none.      Non-bleeding internal hemorrhoids were found during retroflexion. The       hemorrhoids were Grade I (internal hemorrhoids that do not prolapse).      The exam was otherwise without abnormality. Impression:            - Diverticulosis in the sigmoid colon.                        - One 9 mm polyp in the distal sigmoid colon, removed                         with a hot snare. Resected and retrieved.                        - Non-bleeding internal hemorrhoids.                        - The examination was otherwise normal. Recommendation:        - Patient has a contact number available for                         emergencies. The signs and symptoms of potential                         delayed complications were discussed with the patient.  Return to normal activities tomorrow. Written                         discharge instructions were provided to the patient.                        - Continue present medications.                        - Resume previous diet.                        - Resume Xarelto (rivaroxaban) at prior dose today.                         Refer to managing physician for further adjustment of                         therapy.                        - Repeat colonoscopy is recommended for surveillance.                         The  colonoscopy date will be determined after                         pathology results from today's exam become available                         for review.                        - Return to GI office PRN.                        - The findings and recommendations were discussed with                         the patient. Procedure Code(s):     --- Professional ---                        [phone], Colonoscopy, flexible; with removal of                         tumor(s), polyp(s), or other lesion(s) by snare                         technique Diagnosis Code(s):     --- Professional ---                        K57.30, Diverticulosis of large intestine without                         perforation or abscess without bleeding                        K63.5, Polyp of colon                        K64.0, First degree hemorrhoids  Z86.010, Personal history of colonic polyps CPT copyright 2019 American Medical Association. All rights reserved. The codes documented in this report are preliminary and upon coder review may  be revised to meet current compliance requirements. Efrain Sella MD, MD 11/10/2019 1:24:00 PM This report has been signed electronically. Number of Addenda: 0 Note Initiated On: 11/10/2019 12:58 PM Scope Withdrawal Time: 0 hours 11 minutes 36 seconds  Total Procedure Duration: 0 hours 16 minutes 27 seconds  Estimated Blood Loss:  Estimated blood loss: none.      [facility]

## 2019-11-10 NOTE — Anesthesia Procedure Notes (Signed)
Procedure Name: General with mask airway Performed by: Fletcher-Harrison, Travus Oren, CRNA Pre-anesthesia Checklist: Patient identified, Emergency Drugs available, Suction available and Patient being monitored Patient Re-evaluated:Patient Re-evaluated prior to induction Oxygen Delivery Method: Simple face mask Induction Type: IV induction Placement Confirmation: positive ETCO2 and CO2 detector Dental Injury: Teeth and Oropharynx as per pre-operative assessment        

## 2019-11-10 NOTE — Anesthesia Postprocedure Evaluation (Signed)
Anesthesia Post Note  Patient: HENDRIXX SEVERIN  Procedure(s) Performed: COLONOSCOPY WITH PROPOFOL (N/A )  Patient location during evaluation: Endoscopy Anesthesia Type: General Level of consciousness: awake and alert Pain management: pain level controlled Vital Signs Assessment: post-procedure vital signs reviewed and stable Respiratory status: spontaneous breathing, nonlabored ventilation, respiratory function stable and patient connected to nasal cannula oxygen Cardiovascular status: blood pressure returned to baseline and stable Postop Assessment: no apparent nausea or vomiting Anesthetic complications: no   No complications documented.   Last Vitals:  Vitals:   11/10/19 1335 11/10/19 1345  BP: 115/65 120/83  Pulse: (!) 105 98  Resp: 18 10  Temp:    SpO2: 96% 96%    Last Pain:  Vitals:   11/10/19 1345  TempSrc:   PainSc: 0-No pain                 Precious Haws Kaleya Douse

## 2019-11-10 NOTE — Assessment & Plan Note (Signed)
Lower PLT.  H/o fatty liver. No bleeding.   Pathophysiology discussed with patient.  Would continue work on weight reduction we can recheck periodically.  He agrees.

## 2019-11-10 NOTE — Assessment & Plan Note (Signed)
LFT elevation.  H/o fatty liver.  D/w pt about diet and exercise.  D/w pt about avoiding tylenol and etoh.

## 2019-11-10 NOTE — Assessment & Plan Note (Signed)
He will continue Xarelto unless there is a compelling transient reason to cease, as with his upcoming colonoscopy. We talked about anticoagulation, held x3 days for colonoscopy.  No bleeding.  No ade on xarelto.

## 2019-11-10 NOTE — Assessment & Plan Note (Signed)
Would defer statin at this point, until he can get other issues resolved, see below.  D/w pt.     He is going to work more on diet and exercise since his back pain is better in the meantime then we can recheck his A1c periodically here at the clinic.  He agrees to plan.  Continue Januvia and glimepiride.

## 2019-11-10 NOTE — H&P (Signed)
Outpatient short stay form Pre-procedure 11/10/2019 11:55 AM Tim Hodge, M.D.  Primary Physician: Elsie Stain, M.D.  Reason for visit:  Personal history of sessile serrated adenoma, tubular adenomatous polyps x 2, colonoscopy January 2016.  History of present illness:                            Patient presents for colonoscopy for a personal hx of colon polyps. The patient denies abdominal pain, abnormal weight loss or rectal bleeding.  Patient takes Xarelto but has had approval from the prescribing physician to hold for 3 days prior to colonoscopy.   No current facility-administered medications for this encounter.  Medications Prior to Admission  Medication Sig Dispense Refill Last Dose  . gabapentin (NEURONTIN) 300 MG capsule Take 1 capsule (300 mg total) by mouth 3 (three) times daily. 180 capsule 5   . glimepiride (AMARYL) 4 MG tablet TAKE 2 TABLETS BY MOUTH ONCE DAILY WITH BREAKFAST 120 tablet 5   . Krill Oil 1000 MG CAPS Take 1,000 mg by mouth.     Marland Kitchen lisinopril (ZESTRIL) 5 MG tablet Take 1 tablet (5 mg total) by mouth daily. 30 tablet 5   . rivaroxaban (XARELTO) 20 MG TABS tablet TAKE ONE TABLET BY MOUTH ONCE DAILY WITH  SUPPER 60 tablet 5   . sitaGLIPtin (JANUVIA) 100 MG tablet Take 1 tablet (100 mg total) by mouth daily. 60 tablet 5      Allergies  Allergen Reactions  . Iodinated Diagnostic Agents Shortness Of Breath and Rash  . Flexeril [Cyclobenzaprine]     Intolerant- sedation.    . Tramadol Other (See Comments)    Ineffective for pain.   . Vioxx [Rofecoxib] Other (See Comments)    LFT elevation  . Metformin And Related Other (See Comments)    Muscle cramps  . Valium [Diazepam] Rash    Rash     Past Medical History:  Diagnosis Date  . Acute venous embolism and thrombosis of unspecified deep vessels of lower extremity   . Compression fx, lumbar spine ([place])    2017  . Elevated blood pressure reading without diagnosis of hypertension   . Headache   .  Lumbago   . Polyp of colon   . Pulmonary embolism ([place])   . Type II or unspecified type diabetes mellitus without mention of complication, not stated as uncontrolled   . Unspecified arthropathy, ankle and foot     Review of systems:  Otherwise negative.    Physical Exam  Gen: Alert, oriented. Appears stated age.  HEENT: [place]/AT. PERRLA. Lungs: CTA, no wheezes. CV: RR nl S1, S2. Abd: soft, benign, no masses. BS+ Ext: No edema. Pulses 2+    Planned procedures: Proceed with colonoscopy. The patient understands the nature of the planned procedure, indications, risks, alternatives and potential complications including but not limited to bleeding, infection, perforation, damage to internal organs and possible oversedation/side effects from anesthesia. The patient agrees and gives consent to proceed.  Please refer to procedure notes for findings, recommendations and patient disposition/instructions.     Bassam Dresch K. Alice Hodge, M.D. Gastroenterology 11/10/2019  11:55 AM

## 2019-11-10 NOTE — Anesthesia Preprocedure Evaluation (Signed)
Anesthesia Evaluation  Patient identified by MRN, date of birth, ID band Patient awake    Reviewed: Allergy & Precautions, H&P , NPO status , Patient's Chart, lab work & pertinent test results  History of Anesthesia Complications Negative for: history of anesthetic complications  Airway Mallampati: III  TM Distance: <3 FB Neck ROM: limited    Dental  (+) Chipped, Poor Dentition, Missing, Partial Lower   Pulmonary neg pulmonary ROS, neg shortness of breath, former smoker,    Pulmonary exam normal        Cardiovascular Exercise Tolerance: Good hypertension, (-) angina(-) Past MI and (-) DOE Normal cardiovascular exam     Neuro/Psych  Headaches, negative psych ROS   GI/Hepatic negative GI ROS, Neg liver ROS, neg GERD  ,  Endo/Other  diabetes, Type 2  Renal/GU negative Renal ROS  negative genitourinary   Musculoskeletal   Abdominal   Peds  Hematology negative hematology ROS (+)   Anesthesia Other Findings Past Medical History: No date: Acute venous embolism and thrombosis of unspecified deep  vessels of lower extremity No date: Compression fx, lumbar spine ([facility])     Comment:  2017 No date: Elevated blood pressure reading without diagnosis of  hypertension No date: Headache No date: Lumbago No date: Polyp of colon No date: Pulmonary embolism ([facility]) No date: Type II or unspecified type diabetes mellitus without  mention of complication, not stated as uncontrolled No date: Unspecified arthropathy, ankle and foot  Past Surgical History: No date: FOOT SURGERY; Left No date: HAND SURGERY; Right 07/11/2016: KYPHOPLASTY; N/A     Comment:  Procedure: LUMBAR 2 KYPHOPLASTY;  Surgeon: Phylliss Bob, MD;  Location: [place];  Service: Orthopedics;                Laterality: N/A;  LUMBAR 2 KYPHOPLASTY 2021: KYPHOPLASTY ~1998: LAMINECTOMY  BMI    Body Mass Index: 42.04 kg/m       Reproductive/Obstetrics negative OB ROS                             Anesthesia Physical Anesthesia Plan  ASA: III  Anesthesia Plan: General   Post-op Pain Management:    Induction: Intravenous  PONV Risk Score and Plan: Propofol infusion and TIVA  Airway Management Planned: Natural Airway and Nasal Cannula  Additional Equipment:   Intra-op Plan:   Post-operative Plan:   Informed Consent: I have reviewed the patients History and Physical, chart, labs and discussed the procedure including the risks, benefits and alternatives for the proposed anesthesia with the patient or authorized representative who has indicated his/her understanding and acceptance.     Dental Advisory Given  Plan Discussed with: Anesthesiologist, CRNA and Surgeon  Anesthesia Plan Comments: (Patient consented for risks of anesthesia including but not limited to:  - adverse reactions to medications - risk of intubation if required - damage to eyes, teeth, lips or other oral mucosa - nerve damage due to positioning  - sore throat or hoarseness - Damage to heart, brain, nerves, lungs, other parts of body or loss of life  Patient voiced understanding.)        Anesthesia Quick Evaluation

## 2019-11-10 NOTE — Assessment & Plan Note (Signed)
Tetanus 2016 Flu yearly  PNA vaccine up to date.  Shingles d/w pt.   He had his moderna covid vaccine.  EMR updated.  Colonoscopy pending 2021.   Prostate cancer screening and PSA options(with potential risks and benefits of testing vs not testing) were discussed along with recent recs/guidelines. He declined testing PSAat this point. Living will d/w pt. Wife designated if patient were incapacitated.

## 2019-11-10 NOTE — Assessment & Plan Note (Signed)
His back pain is better after repeat kyphoplasty this year.  Still on gabapentin, sometimes with more relief than others. Noted more pain with weather changes but tolerable as is.  He'll update me as needed.

## 2019-11-11 ENCOUNTER — Encounter: Payer: Self-pay | Admitting: Internal Medicine

## 2019-11-11 ENCOUNTER — Telehealth: Payer: Self-pay | Admitting: Family Medicine

## 2019-11-11 LAB — SURGICAL PATHOLOGY

## 2019-11-11 NOTE — Telephone Encounter (Signed)
Patient came into office to drop off form for assistance with insurance for PCP. Placed in tower.

## 2019-11-11 NOTE — Telephone Encounter (Signed)
I'll work on the hard copy.  Thanks.  

## 2019-11-17 NOTE — Telephone Encounter (Signed)
I am looking back over the form.  I need extra information.  Does the patient need every hospitalization/procedure/inpatient data point included since his initial injury or is it only related to the initial injury?  Please let me know.  Thanks.

## 2019-11-18 NOTE — Telephone Encounter (Signed)
Pt reports the paperwork is only related to the initial injury and surgery and not everything else. Advised this office would contact him when the paperwork was finished. Pt verbalized understanding.

## 2019-11-23 NOTE — Telephone Encounter (Signed)
Form copied for scanning.  Patient advised and will pick up form.  Form left at front desk.

## 2019-11-23 NOTE — Telephone Encounter (Signed)
Form done.  Please scan and send.  I did my portion of the form, thanks.

## 2019-11-30 ENCOUNTER — Other Ambulatory Visit: Payer: Self-pay | Admitting: Family Medicine

## 2019-12-01 ENCOUNTER — Other Ambulatory Visit: Payer: Self-pay | Admitting: Family Medicine

## 2019-12-01 NOTE — Telephone Encounter (Signed)
Sent. Thanks.   

## 2019-12-01 NOTE — Telephone Encounter (Signed)
Refill request Xarelto Last office visit 11/08/19 Last refill 11/05/18 #60/5

## 2020-03-13 ENCOUNTER — Encounter: Payer: Self-pay | Admitting: Family Medicine

## 2020-03-13 ENCOUNTER — Other Ambulatory Visit: Payer: Self-pay

## 2020-03-13 ENCOUNTER — Ambulatory Visit (INDEPENDENT_AMBULATORY_CARE_PROVIDER_SITE_OTHER): Payer: Medicare Other | Admitting: Family Medicine

## 2020-03-13 VITALS — BP 118/70 | HR 90 | Temp 98.2°F | Ht 73.0 in | Wt 318.9 lb

## 2020-03-13 DIAGNOSIS — M6530 Trigger finger, unspecified finger: Secondary | ICD-10-CM

## 2020-03-13 DIAGNOSIS — Z23 Encounter for immunization: Secondary | ICD-10-CM

## 2020-03-13 DIAGNOSIS — E119 Type 2 diabetes mellitus without complications: Secondary | ICD-10-CM

## 2020-03-13 LAB — POCT GLYCOSYLATED HEMOGLOBIN (HGB A1C): Hemoglobin A1C: 8.7 % — AB (ref 4.0–5.6)

## 2020-03-13 MED ORDER — LISINOPRIL 5 MG PO TABS: 5.0000 mg | ORAL_TABLET | Freq: Every day | ORAL | 12 refills | Status: DC

## 2020-03-13 MED ORDER — SITAGLIPTIN PHOSPHATE 100 MG PO TABS: 100.0000 mg | ORAL_TABLET | Freq: Every day | ORAL | 12 refills | Status: DC

## 2020-03-13 MED ORDER — GABAPENTIN 300 MG PO CAPS: 300.0000 mg | ORAL_CAPSULE | Freq: Three times a day (TID) | ORAL | 12 refills | Status: DC

## 2020-03-13 MED ORDER — RIVAROXABAN 20 MG PO TABS: ORAL_TABLET | ORAL | 12 refills | Status: DC

## 2020-03-13 MED ORDER — GLIMEPIRIDE 4 MG PO TABS: ORAL_TABLET | ORAL | 12 refills | Status: DC

## 2020-03-13 NOTE — Patient Instructions (Addendum)
If the finger trouble gets worse, then ask about seeing Dr. Lorelei Pont.   Ask your wife about your snoring.  If persistent then let me know so we can see about sleep apnea testing.  Price check trulicity.  That may be a reasonable replacement for januvia.   Work on diet and exercise and if your sugar isn't getting better then let me know.   Plan on recheck in 3 months with A1c at the visit like today.   Take care.  Glad to see you.

## 2020-03-13 NOTE — Progress Notes (Signed)
This visit occurred during the SARS-CoV-2 public health emergency.  Safety protocols were in place, including screening questions prior to the visit, additional usage of staff PPE, and extensive cleaning of exam room while observing appropriate contact time as indicated for disinfecting solutions.  Diabetes:  Using medications without difficulties: yes Hypoglycemic episodes: no Hyperglycemic episodes:no Feet problems: "fine" Blood Sugars averaging: usually ~130-150 eye exam within last year: due, d/w pt.   A1c done at [facility].    Flu shot today.    He had L 3rd PIP triggering occ.  D/w pt.    His pain level is overall improved from prev max level.  He has sleep disruption as a holdover from prev back injury and that is still going on.  He has some snoring.  No prev OSA eval.  19" neck.  He wants to check with his wife about his sx at night and then update me.  No red flag events.  Cautions d/w pt. sleep apnea in general discussed with patient.  He still has fatigue after covid infection, last year.  He is looking forward to hunting season.    Meds, vitals, and allergies reviewed.  ROS: Per HPI unless specifically indicated in ROS section   GEN: nad, alert and oriented HEENT: ncat NECK: supple w/o LA CV: rrr. PULM: ctab, no inc wob ABD: soft, +bs EXT: no edema SKIN: no acute rash Normal range of motion left third finger bedtime by exam.

## 2020-03-16 NOTE — Assessment & Plan Note (Signed)
Discussed about sleep apnea and diabetes. He will ask his wife about his snoring.  If persistent then he will let me know so we can see about sleep apnea testing.  He can Price check trulicity.  That may be a reasonable replacement for januvia.  Discussed. He will continue work on diet and exercise and if his sugar isn't getting better then he will let me know.   Plan on recheck in 3 months with A1c at the visit like today.

## 2020-03-16 NOTE — Assessment & Plan Note (Signed)
He can see Dr. Lorelei Pont if this is becoming more problematic.  Discussed.

## 2020-06-13 ENCOUNTER — Ambulatory Visit: Payer: BC Managed Care – PPO | Admitting: Family Medicine

## 2020-06-13 ENCOUNTER — Other Ambulatory Visit: Payer: Self-pay

## 2020-06-20 ENCOUNTER — Other Ambulatory Visit: Payer: Self-pay

## 2020-06-20 ENCOUNTER — Ambulatory Visit (INDEPENDENT_AMBULATORY_CARE_PROVIDER_SITE_OTHER): Payer: Medicare PPO | Admitting: Family Medicine

## 2020-06-20 ENCOUNTER — Encounter: Payer: Self-pay | Admitting: Family Medicine

## 2020-06-20 VITALS — BP 122/68 | HR 72 | Temp 97.5°F | Ht 73.0 in | Wt 319.0 lb

## 2020-06-20 DIAGNOSIS — E119 Type 2 diabetes mellitus without complications: Secondary | ICD-10-CM

## 2020-06-20 DIAGNOSIS — L84 Corns and callosities: Secondary | ICD-10-CM | POA: Diagnosis not present

## 2020-06-20 LAB — POCT GLYCOSYLATED HEMOGLOBIN (HGB A1C): Hemoglobin A1C: 10.2 % — AB (ref 4.0–5.6)

## 2020-06-20 NOTE — Patient Instructions (Addendum)
Have the eye clinic send me a copy of your note.    Let me check options for trulicity.  Plan on recheck in about 3 months with labs prior to a physical.   Take care.  Glad to see you.

## 2020-06-20 NOTE — Progress Notes (Unsigned)
This visit occurred during the SARS-CoV-2 public health emergency.  Safety protocols were in place, including screening questions prior to the visit, additional usage of staff PPE, and extensive cleaning of exam room while observing appropriate contact time as indicated for disinfecting solutions.  Diabetes:  Using medications without difficulties: januvia and glimepiride.   Hypoglycemic episodes: no Hyperglycemic episodes:up to 200s rarely per patient report Feet problems: he has been having foot pain since prev surgery.  More pain with prolonged walking.   Blood Sugars averaging: none below 150.   eye exam within last year: due,d/w pt.  He'll call about appointment.  A1c up, d/w pt.   Diet d/w pt.    We talked about cautions and consideration specific to Trulicity.  No history of thyroid disease.  He isn't snoring more, d/w pt.   He is putting up with L knee pain, with gait change from L foot.    Meds, vitals, and allergies reviewed.  ROS: Per HPI unless specifically indicated in ROS section   GEN: nad, alert and oriented HEENT: ncat NECK: supple w/o LA CV: rrr. PULM: ctab, no inc wob ABD: soft, +bs EXT: no edema SKIN: no acute rash  Diabetic foot exam: Normal inspection No skin breakdown Calluses L foot distal 1MT Normal DP pulses Normal sensation to light touch and monofilament Nails normal

## 2020-06-21 NOTE — Assessment & Plan Note (Signed)
We talked about options.  I will check with pharmacy about switching him to Trulicity instead of Januvia.  We may need to eventually switch out his glimepiride but I think it probably makes sense to check on Trulicity first.  We will see about options and go from there.  Diet discussed.  Recheck periodically.

## 2020-06-22 ENCOUNTER — Telehealth: Payer: Self-pay

## 2020-06-22 NOTE — Telephone Encounter (Signed)
Contacted patient to discuss switch from Januvia to Trulicity and cost per PCP request. Pt unavailable. Left my phone number with family member.  Reviewed [employer] plan specific formulary. Trulicity is tier 3. Januvia Tier 3. Price should be the same. However, not certain as pt appears to have dual coverage with [employer]. Will await return call.  [name], PharmD Clinical Pharmacist [facility] at [facility] [phone]

## 2020-06-24 ENCOUNTER — Other Ambulatory Visit: Payer: Self-pay | Admitting: Family Medicine

## 2020-06-24 DIAGNOSIS — E119 Type 2 diabetes mellitus without complications: Secondary | ICD-10-CM

## 2020-06-30 ENCOUNTER — Other Ambulatory Visit: Payer: Self-pay

## 2020-06-30 ENCOUNTER — Encounter: Payer: Self-pay | Admitting: Podiatry

## 2020-06-30 ENCOUNTER — Ambulatory Visit (INDEPENDENT_AMBULATORY_CARE_PROVIDER_SITE_OTHER): Payer: Medicare PPO

## 2020-06-30 ENCOUNTER — Ambulatory Visit: Payer: Medicare PPO | Admitting: Podiatry

## 2020-06-30 DIAGNOSIS — M778 Other enthesopathies, not elsewhere classified: Secondary | ICD-10-CM | POA: Diagnosis not present

## 2020-06-30 DIAGNOSIS — M19072 Primary osteoarthritis, left ankle and foot: Secondary | ICD-10-CM

## 2020-06-30 MED ORDER — MELOXICAM 15 MG PO TABS: 15.0000 mg | ORAL_TABLET | Freq: Every day | ORAL | 1 refills | Status: DC

## 2020-06-30 NOTE — Progress Notes (Signed)
   HPI: 66 y.o. male presenting today for evaluation of left foot pain.  The patient is retired however he is very active and he experiences left foot pain.  He does have a history of an injury approximately 20 years ago when he fell off of a bridge and had surgery on his left foot.  He has had pain with achiness ever since.  He presents for further treatment evaluation  Past Medical History:  Diagnosis Date  . Acute venous embolism and thrombosis of unspecified deep vessels of lower extremity   . Compression fx, lumbar spine ([place])    2017  . Elevated blood pressure reading without diagnosis of hypertension   . Headache   . Lumbago   . Polyp of colon   . Pulmonary embolism ([place])   . Type II or unspecified type diabetes mellitus without mention of complication, not stated as uncontrolled   . Unspecified arthropathy, ankle and foot      Physical Exam: General: The patient is alert and oriented x3 in no acute distress.  Dermatology: Skin is warm, dry and supple bilateral lower extremities. Negative for open lesions or macerations.  Vascular: Palpable pedal pulses bilaterally. No edema or erythema noted. Capillary refill within normal limits.  Neurological: Epicritic and protective threshold grossly intact bilaterally.   Musculoskeletal Exam: Limited range of motion noted.  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  No orthopedic hardware noted.  Degenerative changes noted.  This is consistent with the patient's given history of trauma with ORIF  Assessment: 1.  Capsulitis left foot 2.  DJD left foot 3.  H/0 left foot trauma with ORIF   Plan of Care:  1. Patient evaluated. X-Rays reviewed.  2.  OTC arch supports provided.  Felt dancers pad was applied to the insole of the left foot to offload pressure from the first MTPJ 3.  Patient declined steroid injection. 4.  Patient is on anticoagulant so we  will not prescribe any NSAIDs 5.  Return to clinic as needed      Edrick Kins, DPM [facility]  Dr. Edrick Kins, DPM    [address], [place] [address]                Office [phone]  Fax [phone]

## 2020-07-06 ENCOUNTER — Telehealth: Payer: Self-pay | Admitting: Family Medicine

## 2020-07-06 NOTE — Chronic Care Management (AMB) (Signed)
  Chronic Care Management   Note  07/06/2020 Name: FARUQ ROSENBERGER MRN: [id number] DOB: 1955-10-05  DARIC KOREN is a 65 y.o. year old male who is a primary care patient of Tonia Ghent, MD. I reached out to Octaviano Batty by phone today in response to a referral sent by Mr. Philemon Kingdom Musquiz's PCP, Tonia Ghent, MD.   Mr. Conran was given information about Chronic Care Management services today including:  1. CCM service includes personalized support from designated clinical staff supervised by his physician, including individualized plan of care and coordination with other care providers 2. 24/7 contact phone numbers for assistance for urgent and routine care needs. 3. Service will only be billed when office clinical staff spend 20 minutes or more in a month to coordinate care. 4. Only one practitioner may furnish and bill the service in a calendar month. 5. The patient may stop CCM services at any time (effective at the end of the month) by phone call to the office staff.   Patient agreed to services and verbal consent obtained.   Follow up plan:   Lauretta Grill [facility] Scheduler

## 2020-08-23 ENCOUNTER — Telehealth: Payer: Self-pay

## 2020-08-23 NOTE — Chronic Care Management (AMB) (Addendum)
Chronic Care Management Pharmacy Assistant   Name: Tim Hodge  MRN: [id number] DOB: [birth year]  Reason for Encounter: CCM Initial visit    Conditions to be addressed/monitored: HTN and DMII  Recent office visits:  06/20/2020  Dr.Graham Damita Dunnings  PCP No medication changes, f/u 3 months.  Recent consult visits:  06/30/2020  Daylene Katayama DPM    No medication changes, f/u prn  Hospital visits:  None in previous 6 months  Medications: Outpatient Encounter Medications as of 08/23/2020  Medication Sig   gabapentin (NEURONTIN) 300 MG capsule Take 1 capsule (300 mg total) by mouth 3 (three) times daily.   glimepiride (AMARYL) 4 MG tablet TAKE 2 TABLETS BY MOUTH ONCE DAILY WITH BREAKFAST   Krill Oil 1000 MG CAPS Take 1,000 mg by mouth.   lisinopril (ZESTRIL) 5 MG tablet Take 1 tablet (5 mg total) by mouth daily.   rivaroxaban (XARELTO) 20 MG TABS tablet TAKE 1 TABLET BY MOUTH ONCE DAILY WITH SUPPER   sitaGLIPtin (JANUVIA) 100 MG tablet Take 1 tablet (100 mg total) by mouth daily.   No facility-administered encounter medications on file as of 08/23/2020.    Lab Results  Component Value Date/Time   HGBA1C 10.2 (A) 06/20/2020 10:37 AM   HGBA1C 8.7 (A) 03/13/2020 08:48 AM   HGBA1C 8.3 (H) 11/02/2019 07:44 AM   HGBA1C 6.8 (H) 05/10/2019 09:01 AM     BP Readings from Last 3 Encounters:  06/20/20 122/68  03/13/20 118/70  11/10/19 120/83   Have you seen any other providers since your last visit with PCP? Yes  06/30/2020  Daylene Katayama DPM    No medication changes   Any changes in your medications or health? Yes the patient reports he feels like he has no energy.  Any side effects from any medications? No  Do you have an symptoms or problems not managed by your medications? No  Any concerns about your health right now? Yes the patient reports he is on xarelto and bleeds all the time.  Has your provider asked that you check blood pressure, blood sugar, or follow special diet at home?  No he does not check BP or BG on a regular basis. The patient eats healthy meals.  Do you get any type of exercise on a regular basis? No the patient reports that since he had Covid-19 2 years ago,he is unable to find the energy to exercise.He does mow the grass.  Can you think of a goal you would like to reach for your health? Yes the patient reports he would like to have more energy.  Do you have any problems getting your medications? No Patient's preferred pharmacy is:  [facility], [place] [address] Phone: [phone] Fax: [phone]   Is there anything that you would like to discuss during the appointment? No   JOAB CARDEN was reminded to have all medications, supplements and any blood glucose and blood pressure readings available for review with [name], Pharm. D, at his telephone visit on 08/31/2020 at 3:30pm   Star Rating Drugs:  Medication:  Last Fill: Day Supply Lisinopril 5mg .  01/26/2020 30ds Januvia 100mg . 03/07/2020 30ds Glimepiride 4mg . 01/26/2020 30ds  Follow-Up:  Pharmacist Review  [name], CPP notified  [name] [facility] Clinical Pharmacy Assistant [phone] I have reviewed the care management and care coordination activities outlined in this encounter and I am certifying that I agree with the content of this note.  No further action required.  [name], PharmD Clinical Pharmacist [facility] at [facility] [phone]

## 2020-08-30 ENCOUNTER — Telehealth: Payer: Medicare PPO

## 2020-08-31 ENCOUNTER — Other Ambulatory Visit: Payer: Self-pay

## 2020-08-31 ENCOUNTER — Ambulatory Visit (INDEPENDENT_AMBULATORY_CARE_PROVIDER_SITE_OTHER): Payer: Medicare PPO

## 2020-08-31 DIAGNOSIS — I10 Essential (primary) hypertension: Secondary | ICD-10-CM

## 2020-08-31 DIAGNOSIS — E119 Type 2 diabetes mellitus without complications: Secondary | ICD-10-CM

## 2020-08-31 NOTE — Progress Notes (Addendum)
Chronic Care Management Pharmacy Note  08/31/20 Name:  Tim Hodge MRN:  [id number] DOB:  [birth year]  Subjective: Tim Hodge is an 65 y.o. year old male who is a primary patient of Damita Dunnings, Elveria Rising, MD.  The CCM team was consulted for assistance with disease management and care coordination needs.    Engaged with patient by telephone for initial visit in response to provider referral for pharmacy case management and/or care coordination services.   Consent to Services:  The patient was given the following information about Chronic Care Management services today, agreed to services, and gave verbal consent: 1. CCM service includes personalized support from designated clinical staff supervised by the primary care provider, including individualized plan of care and coordination with other care providers 2. 24/7 contact phone numbers for assistance for urgent and routine care needs. 3. Service will only be billed when office clinical staff spend 20 minutes or more in a month to coordinate care. 4. Only one practitioner may furnish and bill the service in a calendar month. 5.The patient may stop CCM services at any time (effective at the end of the month) by phone call to the office staff. 6. The patient will be responsible for cost sharing (co-pay) of up to 20% of the service fee (after annual deductible is met). Patient agreed to services and consent obtained.  Patient Care Team: Tonia Ghent, MD as PCP - General (Family Medicine) Phylliss Bob, MD as Consulting Physician (Orthopedic Surgery) [name], [facility] as Pharmacist (Pharmacist)  Recent office visits:  06/20/2020 - Patsi Sears, PCP -  I will check with pharmacy about switching him to Trulicity instead of Januvia. We may need to eventually switch out his glimepiride but I think it probably makes sense to check on Trulicity first. Send copy of eye exam. F/u 3 months.  Recent consult visits:  06/30/2020 -  Daylene Katayama,  DPM - No medication changes  Hospital visits: None in previous 6 months  Objective:  Allergies  Allergen Reactions  . Iodinated Diagnostic Agents Shortness Of Breath and Rash  . Flexeril [Cyclobenzaprine]     Intolerant- sedation.    . Iodine   . Tramadol Other (See Comments)    Ineffective for pain.   . Vioxx [Rofecoxib] Other (See Comments)    LFT elevation  . Metformin And Related Other (See Comments)    Muscle cramps  . Valium [Diazepam] Rash    Rash     Lab Results  Component Value Date   CREATININE 0.94 11/02/2019   BUN 14 11/02/2019   GFR 80.94 11/02/2019   GFRNONAA >60 07/09/2016   GFRAA >60 07/09/2016   NA 136 11/02/2019   K 4.3 11/02/2019   CALCIUM 8.8 11/02/2019   CO2 28 11/02/2019   GLUCOSE 288 (H) 11/02/2019    Lab Results  Component Value Date/Time   HGBA1C 10.2 (A) 06/20/2020 10:37 AM   HGBA1C 8.7 (A) 03/13/2020 08:48 AM   HGBA1C 8.3 (H) 11/02/2019 07:44 AM   HGBA1C 6.8 (H) 05/10/2019 09:01 AM   GFR 80.94 11/02/2019 07:44 AM   GFR 86.49 11/02/2018 07:56 AM    Last diabetic Eye exam:  Lab Results  Component Value Date/Time   HMDIABEYEEXA No Retinopathy 05/18/2018 12:00 AM    Last diabetic Foot exam:  11/08/19 PCP  Lab Results  Component Value Date   CHOL 157 11/02/2019   HDL 41.60 11/02/2019   LDLCALC 86 11/02/2019   LDLDIRECT 117.8 02/07/2014   TRIG 145.0 11/02/2019  CHOLHDL 4 11/02/2019    Hepatic Function Latest Ref Rng & Units 11/02/2019 11/02/2018 07/15/2017  Total Protein 6.0 - 8.3 g/dL 6.2 6.0 6.7  Albumin 3.5 - 5.2 g/dL 3.5 3.6 3.6  AST 0 - 37 U/L 39(H) 40(H) 32  ALT 0 - 53 U/L 34 46 38  Alk Phosphatase 39 - 117 U/L 147(H) 151(H) 112  Total Bilirubin 0.2 - 1.2 mg/dL 1.4(H) 1.1 1.0    No results found for: TSH, FREET4  CBC Latest Ref Rng & Units 11/02/2019 05/10/2019 11/02/2018  WBC 4.0 - 10.5 K/uL 4.9 4.2 4.4  Hemoglobin 13.0 - 17.0 g/dL 13.9 14.3 14.3  Hematocrit 39.0 - 52.0 % 40.8 42.7 41.9  Platelets 150.0 - 400.0 K/uL  115.0(L) 143.0(L) 130.0(L)    No results found for: VD25OH  Clinical ASCVD: No  The 10-year ASCVD risk score Mikey Bussing DC Jr., et al., 2013) is: 21.6%   Values used to calculate the score:     Age: 75 years     Sex: Male     Is Non-Hispanic African American: No     Diabetic: Yes     Tobacco smoker: No     Systolic Blood Pressure: 846 mmHg     Is BP treated: Yes     HDL Cholesterol: 41.6 mg/dL     Total Cholesterol: 157 mg/dL    Depression screen [facility] 2/9 03/13/2020 02/05/2019 01/09/2016  Decreased Interest 0 0 0  Down, Depressed, Hopeless 0 0 0  PHQ - 2 Score 0 0 0    Social History   Tobacco Use  Smoking Status Former Smoker  . Types: Cigarettes  Smokeless Tobacco Never Used   BP Readings from Last 3 Encounters:  06/20/20 122/68  03/13/20 118/70  11/10/19 120/83   Pulse Readings from Last 3 Encounters:  06/20/20 72  03/13/20 90  11/10/19 98   Wt Readings from Last 3 Encounters:  06/20/20 (!) 319 lb (144.7 kg)  03/13/20 (!) 318 lb 14.4 oz (144.7 kg)  11/10/19 (!) 310 lb (140.6 kg)   BMI Readings from Last 3 Encounters:  06/20/20 42.09 kg/m  03/13/20 42.07 kg/m  11/10/19 42.04 kg/m    Assessment/Interventions: Review of patient past medical history, allergies, medications, health status, including review of consultants reports, laboratory and other test data, was performed as part of comprehensive evaluation and provision of chronic care management services.   SDOH:  (Social Determinants of Health) assessments and interventions performed: Yes SDOH Interventions   Flowsheet Row Most Recent Value  SDOH Interventions   Financial Strain Interventions Other (Comment)  [Apply for Trulicity assistance application]     SDOH Screenings   Alcohol Screen: Not on file  Depression (PHQ2-9): Low Risk   . PHQ-2 Score: 0  Financial Resource Strain: Medium Risk  . Difficulty of Paying Living Expenses: Somewhat hard  Food Insecurity: Not on file  Housing: Not on file   Physical Activity: Not on file  Social Connections: Not on file  Stress: Not on file  Tobacco Use: Medium Risk  . Smoking Tobacco Use: Former Smoker  . Smokeless Tobacco Use: Never Used  Transportation Needs: Not on file    CCM Care Plan  Allergies  Allergen Reactions  . Iodinated Diagnostic Agents Shortness Of Breath and Rash  . Flexeril [Cyclobenzaprine]     Intolerant- sedation.    . Iodine   . Tramadol Other (See Comments)    Ineffective for pain.   . Vioxx [Rofecoxib] Other (See Comments)    LFT  elevation  . Metformin And Related Other (See Comments)    Muscle cramps  . Valium [Diazepam] Rash    Rash    Medications Reviewed Today    Reviewed by [name], [facility] (Pharmacist) on 09/01/20 at [place] List Status: <None>  Medication Order Taking? Sig Documenting Provider Last Dose Status Informant  gabapentin (NEURONTIN) 300 MG capsule [id number] Yes Take 1 capsule (300 mg total) by mouth 3 (three) times daily. Tonia Ghent, MD Taking Active   glimepiride (AMARYL) 4 MG tablet [id number] Yes TAKE 2 TABLETS BY MOUTH ONCE DAILY WITH BREAKFAST Tonia Ghent, MD Taking Active   Krill Oil 1000 MG CAPS [id number] Yes Take 1,000 mg by mouth. [provider] Taking Active   lisinopril (ZESTRIL) 5 MG tablet [id number] Yes Take 1 tablet (5 mg total) by mouth daily. Tonia Ghent, MD Taking Active   rivaroxaban Alveda Reasons) 20 MG TABS tablet [id number] Yes TAKE 1 TABLET BY MOUTH ONCE DAILY WITH SUPPER Tonia Ghent, MD Taking Active   sitaGLIPtin (JANUVIA) 100 MG tablet [id number] Yes Take 1 tablet (100 mg total) by mouth daily. Tonia Ghent, MD Taking Active           Patient Active Problem List   Diagnosis Date Noted  . Thrombocytopenia ([place]) 11/10/2019  . Paresthesia 02/07/2019  . Hx of adenomatous colonic polyps 12/07/2018  . Fatty liver 11/19/2018  . LFT elevation 11/08/2018  . Encounter for general adult medical examination with abnormal findings  04/03/2016  . Advance care planning 04/03/2016  . Trigger finger, acquired 04/03/2016  . Severe obesity (BMI >= 40) ([place]) 02/09/2014  . Morbid obesity ([place]) 02/09/2014  . Colon polyps 01/07/2014  . History of DVT of lower extremity 01/07/2014  . HTN (hypertension) 12/01/2013  . Diabetes mellitus without complication ([place]) 24/23/5361  . Chronic back pain 12/01/2013  . History of pulmonary embolism 12/01/2013  . Abnormal EKG 03/01/2013  . PVC's (premature ventricular contractions) 03/01/2013    Immunization History  Administered Date(s) Administered  . Influenza,inj,Quad PF,6+ Mos 03/06/2015, 02/21/2017, 01/16/2018, 02/05/2019, 03/13/2020  . Influenza-Unspecified 02/03/2014  . Moderna Sars-Covid-2 Vaccination 07/21/2019, 08/18/2019, 04/04/2020  . Pneumococcal Polysaccharide-23 04/22/2012  . Tdap 04/22/2009, 10/07/2014    Conditions to be addressed/monitored:  Hypertension, Hyperlipidemia and Diabetes  Care Plan : [place]  Updates made by [name], [place] since 09/01/2020 12:00 AM    Problem: CHL AMB "PATIENT-SPECIFIC PROBLEM"     Long-Range Goal: Disease Management   Start Date: 08/31/2020  Priority: High  Note:   Current Barriers:  . Suboptimal therapeutic regimen for diabetes  Pharmacist Clinical Goal(s):  Marland Kitchen Patient will adhere to plan to optimize therapeutic regimen for diabetes as evidenced by report of adherence to recommended medication management changes through collaboration with PharmD and provider.   Interventions: . 1:1 collaboration with Tonia Ghent, MD regarding development and update of comprehensive plan of care as evidenced by provider attestation and co-signature . Inter-disciplinary care team collaboration (see longitudinal plan of care) . Comprehensive medication review performed; medication list updated in electronic medical record  Hypertension (BP goal <140/90) -Controlled - clinic readings within goal  -Current  treatment: . Lisinopril 5 mg - 1 tablet daily -Medications previously tried: none  -Current home readings: does not have home monitor  -Denies hypotensive/hypertensive symptoms -Educated on BP goals and benefits of medications for prevention of heart attack, stroke and kidney damage; -Recommended to continue current medication  Hyperlipidemia: (LDL goal < 100) -Controlled -  LDL 86 -Current treatment: . None -Medications previously tried: none - statin intolerant, pt describes very significant muscle cramps  -Educated on Cholesterol goals;  -Recommended to continue current medication;   Diabetes (A1c goal <7%) -Uncontrolled - A1c 10.2% -Current medications: . Glimepiride 4 mg - 2 tablets (8 mg) daily with breakfast . Januvia 100 mg - 1 tablet daily  -Medications previously tried: none -Current home glucose readings - not monitoring currently, usually runs very steady  . fasting glucose: 150+ when last checked . post prandial glucose: none  -Denies hypoglycemic/hyperglycemic symptoms -Current meal patterns: trying to lose weight by cutting back on portions, limiting bread  . breakfast: 2 eggs, oatmeal . lunch: sandwich or skips . dinner: home cooked meal, pinto beans, spaghetti, limits fried foods, red meat once a week, eats fish on occasion . snacks: rarely snacks, if so, eats yogurt . drinks: a lot of water, diet soda on occasion  -Current exercise: increased some lately, stays active fishing, gardening -Educated on A1c and blood sugar goals; discussed PCP recommendation to switch Januvia to Trulicity, patient is happy to make this change. We discussed administration once weekly and will replace Januvia. He would like to apply for assistance to help with cost.  -Counseled to check feet daily and get yearly eye exams - DUE for eye exam -Per PCP note, will start Trulicity 1.30 mg weekly patient assistance application.   History of pulmonary embolism/DVT and PVCs(Goal: prevent clots  and major bleeding) -Indefinite treatment -Current treatment: . 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 No blood in urine or stool or other s/s of bleeding. We discussed this seems to be normal bleeding caused by disruption of barriers. Call if no improvement or worsening. Let him know I would let his PCP know about his concern. -Last CBC 10/2019 -Counseled on importance of adherence to anticoagulant exactly as prescribed; avoidance of NSAIDs due to increased bleeding risk with anticoagulants; -Recommended to continue current medication  Patient Goals/Self-Care Activities . Patient will:  - focus on medication adherence by continuing to use pillbox check glucose daily before breakfast, document, and provide at future appointments  Follow Up Plan: Telephone follow up appointment with care management team member scheduled for: 30 days  Medication Assistance: Start Trulicity assistance application.     Patient's preferred pharmacy is:  [facility], [address] [place] [address] Phone: [phone] Fax: [phone]  Uses pill box? Yes Pt endorses 100% compliance - missed evening meds once last week, first missed dose in > 1 year Patient is very happy with current pharmacy services.  We discussed: Benefits of medication synchronization, packaging and delivery as well as enhanced pharmacist oversight with [facility]. Patient decided to: Continue current medication management strategy  Care Plan and Follow Up Patient Decision:  Patient agrees to Care Plan and Follow-up.  [name], PharmD Clinical Pharmacist [facility] at [facility] [phone]  Encounter details: CCM Time Spent      Value Time User   Time  spent with patient (minutes)  60 09/01/2020  4:02 PM [name], [facility]   Time spent performing Chart review  30 09/01/2020  4:02 PM [name], [facility]   Total time (minutes)  90 09/01/2020  4:02 PM [name], RPH     Moderate  to High Complex Decision Making      Value Time User   Moderate to High complex decision making  Yes 09/01/2020  4:02 PM [name], [facility]     CCM Services: This encounter meets complex CCM services and moderate to high decision making.  Prior to outreach and patient consent for Chronic Care Management, I referred this patient for services after reviewing the nominated patient list or from a personal encounter with the patient.  I have personally reviewed this encounter including the documentation in this note and have collaborated with the care management provider regarding care management and care coordination activities to include development and update of the comprehensive care plan. I am certifying that I agree with the content of this note and encounter as supervising physician.

## 2020-09-01 ENCOUNTER — Telehealth: Payer: Self-pay

## 2020-09-01 DIAGNOSIS — K068 Other specified disorders of gingiva and edentulous alveolar ridge: Secondary | ICD-10-CM

## 2020-09-01 DIAGNOSIS — E119 Type 2 diabetes mellitus without complications: Secondary | ICD-10-CM

## 2020-09-01 NOTE — Patient Instructions (Signed)
Sep 01, 2020  Dear Tim Hodge,  It was a pleasure meeting you during our initial appointment on Sep 01, 2020. Below is a summary of the goals we discussed and components of chronic care management. Please contact me anytime with questions or concerns.   Visit Information  Patient Care Plan: CCM Pharmacy Care Plan    Problem Identified: CHL AMB "PATIENT-SPECIFIC PROBLEM"     Long-Range Goal: Disease Management   Start Date: 08/31/2020  Priority: High  Note:   Current Barriers:  . Suboptimal therapeutic regimen for diabetes  Pharmacist Clinical Goal(s):  Marland Kitchen Patient will adhere to plan to optimize therapeutic regimen for diabetes as evidenced by report of adherence to recommended medication management changes through collaboration with PharmD and provider.   Interventions: . 1:1 collaboration with Tim Ghent, MD regarding development and update of comprehensive plan of care as evidenced by provider attestation and co-signature . Inter-disciplinary care team collaboration (see longitudinal plan of care) . Comprehensive medication review performed; medication list updated in electronic medical record  Hypertension (BP goal <140/90) -Controlled - clinic readings within goal  -Current treatment: . Lisinopril 5 mg - 1 tablet daily -Medications previously tried: none  -Current home readings: does not have home monitor  -Denies hypotensive/hypertensive symptoms -Educated on BP goals and benefits of medications for prevention of heart attack, stroke and kidney damage; -Recommended to continue current medication  Hyperlipidemia: (LDL goal < 100) -Controlled - LDL 86 -Current treatment: . None -Medications previously tried: none - statin intolerant, pt describes very significant muscle cramps  -Educated on Cholesterol goals;  -Recommended to continue current medication;   Diabetes (A1c goal <7%) -Uncontrolled - A1c 10.2% -Current medications: . Glimepiride 4 mg - 2 tablets  (8 mg) daily with breakfast . Januvia 100 mg - 1 tablet daily  -Medications previously tried: none -Current home glucose readings - not monitoring currently, usually runs very steady  . fasting glucose: 150+ when last checked . post prandial glucose: none  -Denies hypoglycemic/hyperglycemic symptoms -Current meal patterns: trying to lose weight by cutting back on portions, limiting bread  . breakfast: 2 eggs, oatmeal . lunch: sandwich or skips . dinner: home cooked meal, pinto beans, spaghetti, limits fried foods, red meat once a week, eats fish on occasion . snacks: rarely snacks, if so, eats yogurt . drinks: a lot of water, diet soda on occasion  -Current exercise: increased some lately, stays active fishing, gardening -Educated on A1c and blood sugar goals; discussed PCP recommendation to switch Januvia to Trulicity, patient is happy to make this change. We discussed administration once weekly and will replace Januvia. He would like to apply for assistance to help with cost.  -Counseled to check feet daily and get yearly eye exams - DUE for eye exam -Per PCP note, will start Trulicity 1.61 mg weekly patient assistance application.   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 No blood in urine or stool or other s/s of bleeding. We discussed this seems to be normal bleeding caused by disruption of barriers. Call if no improvement or worsening. Let him know I would let his PCP know about his concern. -Last CBC 10/2019 -Counseled on importance of adherence to anticoagulant exactly as prescribed;  avoidance of NSAIDs due to increased bleeding risk with  anticoagulants; -Recommended to continue current medication  Patient Goals/Self-Care Activities . Patient will:  - focus on medication adherence by continuing to use pillbox check glucose daily before breakfast, document, and provide at future appointments  Follow Up Plan: Telephone follow up appointment with care management team member scheduled for: 30 days  Medication Assistance: Start Trulicity assistance application.     Tim Hodge was given information about Chronic Care Management services today including:  1. CCM service includes personalized support from designated clinical staff supervised by his physician, including individualized plan of care and coordination with other care providers 2. 24/7 contact phone numbers for assistance for urgent and routine care needs. 3. Standard insurance, coinsurance, copays and deductibles apply for chronic care management only during months in which we provide at least 20 minutes of these services. Most insurances cover these services at 100%, however patients may be responsible for any copay, coinsurance and/or deductible if applicable. This service may help you avoid the need for more expensive face-to-face services. 4. Only one practitioner may furnish and bill the service in a calendar month. 5. The patient may stop CCM services at any time (effective at the end of the month) by phone call to the office staff.  Patient agreed to services and verbal consent obtained.   The patient verbalized understanding of instructions, educational materials, and care plan provided today and agreed to receive a mailed copy of patient instructions, educational materials, and care plan.  Telephone follow up appointment with pharmacy team member scheduled for: 67 days  Tim Hodge, PharmD Clinical Pharmacist [facility] at [facility] [phone]   Basics of Medicine Management Taking your medicines correctly is an important part of managing  or preventing medical problems. Make sure you know what disease or condition your medicine is treating, and how and when to take it. If you do not take your medicine correctly, it may not work well and may cause unpleasant side effects, including serious health problems. What should I do when I am taking medicines?  Read all the labels and inserts that come with your medicines. Review the information often.  Talk with your pharmacist if you get a refill and notice a change in the size, color, or shape of your medicines.  Know the potential side effects for each medicine that you take.  Try to get all your medicines from the same pharmacy. The pharmacist will have all your information and will understand how your medicines will affect each other (interact).  Tell your health care provider about all your medicines, including over-the-counter medicines, vitamins, and herbal or dietary supplements. He or she will make sure that nothing will interact with any of your prescribed medicines.   How can I take my medicines safely?  Take medicines only as told by your health care provider. ? Do not take more of your medicine than instructed. ? Do not take anyone else's medicines. ? Do not share your medicines with others. ? Do not stop taking your medicines unless your health care provider tells you to do so. ? You may need to avoid alcohol or certain foods or liquids when taking certain medicines. Follow your health care provider's instructions.  Do not split, mash, or chew your medicines unless your health care provider tells you to do so. Tell your health care provider if you have trouble swallowing your medicines.  For liquid medicine, use the dosing container that was provided. How  should I organize my medicines? Know your medicines  Know what each of your medicines looks like. This includes size, color, and shape. Tell your health care provider if you are having trouble recognizing all the  medicines that you are taking.  If you cannot tell your medicines apart because they look similar, keep them in original bottles.  If you cannot read the labels on the bottles, tell your pharmacist to put your medicines in containers with large print.  Review your medicines and your schedule with family members, a friend, or a caregiver. Use a pill organizer  Use a tool to organize your medicine schedule. Tools include a weekly pillbox, a written chart, a notebook, or a calendar.  Your tool should help you remember the following things about each medicine: ? The name of the medicine. ? The amount (dose) to take. ? The schedule. This is the day and time the medicine should be taken. ? The appearance. This includes color, shape, size, and stamp. ? How to take your medicines. This includes instructions to take them with food, without food, with fluids, or with other medicines.  Create reminders for taking your medicines. Use sticky notes, or alarms on your watch, mobile device, or phone calendar.  You may choose to use a more advanced management system. These systems have storage, alarms, and visual and audio prompts.  Some medicines can be taken on an "as-needed" basis. These include medicines for nausea or pain. If you take an as-needed medicine, write down the name and dose, as well as the date and time that you took it.   How should I plan for travel?  Take your pillbox, medicines, and organization system with you when traveling.  Have your medicines refilled before you travel. This will ensure that you do not run out of your medicines while you are away from home.  Always carry an updated list of your medicines with you. If there is an emergency, a first responder can quickly see what medicines you are taking.  Do not pack your medicines in checked luggage in case your luggage is lost or delayed.  If any of your medicines is considered a controlled substance, make sure you bring a  letter from your health care provider with you. How should I store and discard my medicines? For safe storage:  Store medicines in a cool, dry area away from light, or as directed by your health care provider. Do not store medicines in the bathroom. Heat and humidity will affect them.  Do not store your medicines with other chemicals, or with medicines for pets or other household members.  Keep medicines away from children and pets. Do not leave them on counters or bedside tables. Store them in high cabinets or on high shelves. For safe disposal:  Check expiration dates regularly. Do not take expired medicines. Discard medicines that are older than the expiration date.  Learn a safe way to dispose of your medicines. You may: ? Use a local government, hospital, or pharmacy medicine-take-back program. ? Mix the medicines with inedible substances, put them in a sealed bag or empty container, and throw them in the trash. What should I remember?  Tell your health care provider if you: ? Experience side effects. ? Have new symptoms. ? Have other concerns about taking your medicines.  Review your medicines regularly with your health care provider. Other medicines, diet, medical conditions, weight changes, and daily habits can all affect how medicines work. Ask if you need  to continue taking each medicine, and discuss how well each one is working.  Refill your medicines early to avoid running out of them.  In case of an accidental overdose, call your local [place] at [phone] or visit your local emergency department immediately. This is important. Summary  Taking your medicines correctly is an important part of managing or preventing medical problems.  You need to make sure that you understand what you are taking a medicine for, as well as how and when you need to take it.  Know your medicines and use a pill organizer to help you take your medicines correctly.  In case  of an accidental overdose, call your local [place] at [phone] or visit your local emergency department immediately. This is important. This information is not intended to replace advice given to you by your health care provider. Make sure you discuss any questions you have with your health care provider. Document Revised: 04/03/2017 Document Reviewed: 04/03/2017 Elsevier Patient Education  2021 [employer].

## 2020-09-01 NOTE — Telephone Encounter (Signed)
Pt on Xarelto 20 mg daily reports bleeding in gums with teeth brushing. Also reports his hand bleeding a lot when he nicked it. No changes in urine or stool color, no nose bleeding. Instructed him to call if no improvement or worsening. Let him know I would discuss his concern with Dr. Damita Dunnings in case further evaluation needed. -Last CBC 10/2019  CBC Latest Ref Rng & Units 11/02/2019 05/10/2019 11/02/2018  WBC 4.0 - 10.5 K/uL 4.9 4.2 4.4  Hemoglobin 13.0 - 17.0 g/dL 13.9 14.3 14.3  Hematocrit 39.0 - 52.0 % 40.8 42.7 41.9  Platelets 150.0 - 400.0 K/uL 115.0(L) 143.0(L) 130.0(L)    Lab Results  Component Value Date   CREATININE 0.94 11/02/2019   BUN 14 11/02/2019   GFR 80.94 11/02/2019   GFRNONAA >60 07/09/2016   GFRAA >60 07/09/2016   NA 136 11/02/2019   K 4.3 11/02/2019   CALCIUM 8.8 11/02/2019   CO2 28 11/02/2019    [name], PharmD Clinical Pharmacist [facility] at [facility] [phone]

## 2020-09-01 NOTE — Chronic Care Management (AMB) (Signed)
PAP form completed and uploaded to PCP for Trulicity 3.82 inject once weekly.Left a detailed message for the patient.  [name], CPP notified  [name], [place] Assistant [phone]  Total time spent for month CPA: 20 min.

## 2020-09-03 NOTE — Telephone Encounter (Signed)
I will check a CBC with his next set of labs.  I put in the order.  If he has more bleeding in the meantime please let me know.  Thanks.

## 2020-09-04 NOTE — Telephone Encounter (Signed)
LMTCB

## 2020-09-06 NOTE — Telephone Encounter (Signed)
Spoke with patients wife Hilda Blades (okay per DPR). States patient has been fine and has not had any more bleeding. Advised we will check his CBC with labs at [place] 09/25/20. Advised to call back if has any issues before his appt.

## 2020-09-25 ENCOUNTER — Encounter: Payer: Self-pay | Admitting: Family Medicine

## 2020-09-25 ENCOUNTER — Other Ambulatory Visit: Payer: Self-pay

## 2020-09-25 ENCOUNTER — Ambulatory Visit: Payer: Medicare PPO | Admitting: Family Medicine

## 2020-09-25 VITALS — BP 112/62 | HR 72 | Temp 97.3°F | Ht 73.0 in | Wt 318.0 lb

## 2020-09-25 DIAGNOSIS — E119 Type 2 diabetes mellitus without complications: Secondary | ICD-10-CM | POA: Diagnosis not present

## 2020-09-25 LAB — CBC WITH DIFFERENTIAL/PLATELET
Basophils Absolute: 0 10*3/uL (ref 0.0–0.1)
Basophils Relative: 0.8 % (ref 0.0–3.0)
Eosinophils Absolute: 0.2 10*3/uL (ref 0.0–0.7)
Eosinophils Relative: 4.4 % (ref 0.0–5.0)
HCT: 39.5 % (ref 39.0–52.0)
Hemoglobin: 13.5 g/dL (ref 13.0–17.0)
Lymphocytes Relative: 29.1 % (ref 12.0–46.0)
Lymphs Abs: 1 10*3/uL (ref 0.7–4.0)
MCHC: 34.1 g/dL (ref 30.0–36.0)
MCV: 96 fl (ref 78.0–100.0)
Monocytes Absolute: 0.5 10*3/uL (ref 0.1–1.0)
Monocytes Relative: 13.2 % — ABNORMAL HIGH (ref 3.0–12.0)
Neutro Abs: 1.8 10*3/uL (ref 1.4–7.7)
Neutrophils Relative %: 52.5 % (ref 43.0–77.0)
Platelets: 115 10*3/uL — ABNORMAL LOW (ref 150.0–400.0)
RBC: 4.12 Mil/uL — ABNORMAL LOW (ref 4.22–5.81)
RDW: 13.7 % (ref 11.5–15.5)
WBC: 3.5 10*3/uL — ABNORMAL LOW (ref 4.0–10.5)

## 2020-09-25 LAB — TSH: TSH: 1.09 u[IU]/mL (ref 0.35–4.50)

## 2020-09-25 LAB — HEMOGLOBIN A1C: Hgb A1c MFr Bld: 7.9 % — ABNORMAL HIGH (ref 4.6–6.5)

## 2020-09-25 NOTE — Progress Notes (Signed)
This visit occurred during the SARS-CoV-2 public health emergency.  Safety protocols were in place, including screening questions prior to the visit, additional usage of staff PPE, and extensive cleaning of exam room while observing appropriate contact time as indicated for disinfecting solutions.  Diabetes:  Using medications without difficulties: yes Hypoglycemic episodes: no Hyperglycemic episodes: no Feet problems: occ tingling in the BLE Blood Sugars averaging: higher in the last few weeks, off diet, d/w pt.  Up to 175 recently.   eye exam within last year: due, d/w pt.  Labs pending.   Still on januvia.  Not yet on trulicity.   Still on glimepiride.    He isn't fasting, had oatmeal and raisins.  See notes on labs.  He has noted fatigue since covid.  Taste is still affected.  He is able to get to sleep but then waking in the middle of the night.  Noted since having covid.  He can sleep w/o gasping for air.  "It's like clockwork, I'll get up in the middle of the night."    He is easier to bleed when he checks his sugar, ie with lancet stick, but not bleeding spontaneously o/w.  Still anticoagulated.  He is going to f/u with dental clinic.   Meds, vitals, and allergies reviewed.   ROS: Per HPI unless specifically indicated in ROS section   GEN: nad, alert and oriented HEENT: ncat NECK: supple w/o LA CV: rrr. PULM: ctab, no inc wob ABD: soft, +bs EXT: 1+ BLE edema SKIN: no acute rash

## 2020-09-25 NOTE — Patient Instructions (Signed)
Go to the lab on the way out.   If you have mychart we'll likely use that to update you.    Don't change your meds yet.  Let me check with pharmacy about options.  Take care.  Glad to see you.

## 2020-09-26 LAB — LIPID PANEL
Cholesterol: 167 mg/dL (ref 0–200)
HDL: 45.9 mg/dL (ref >39.00)
LDL Cholesterol: 98 mg/dL (ref 0–99)
NonHDL: 121.1
Total CHOL/HDL Ratio: 4
Triglycerides: 116 mg/dL (ref 0.0–149.0)
VLDL: 23.2 mg/dL (ref 0.0–40.0)

## 2020-09-26 LAB — COMPREHENSIVE METABOLIC PANEL
ALT: 33 U/L (ref 0–53)
AST: 42 U/L — ABNORMAL HIGH (ref 0–37)
Albumin: 3.2 g/dL — ABNORMAL LOW (ref 3.5–5.2)
Alkaline Phosphatase: 172 U/L — ABNORMAL HIGH (ref 39–117)
BUN: 14 mg/dL (ref 6–23)
CO2: 24 mEq/L (ref 19–32)
Calcium: 8.7 mg/dL (ref 8.4–10.5)
Chloride: 104 mEq/L (ref 96–112)
Creatinine, Ser: 0.91 mg/dL (ref 0.40–1.50)
GFR: 89.18 mL/min (ref >60.00)
Glucose, Bld: 396 mg/dL — ABNORMAL HIGH (ref 70–99)
Potassium: 4.4 mEq/L (ref 3.5–5.1)
Sodium: 136 mEq/L (ref 135–145)
Total Bilirubin: 1.1 mg/dL (ref 0.2–1.2)
Total Protein: 5.8 g/dL — ABNORMAL LOW (ref 6.0–8.3)

## 2020-09-28 ENCOUNTER — Telehealth: Payer: Self-pay

## 2020-09-28 NOTE — Chronic Care Management (AMB) (Addendum)
    Chronic Care Management Pharmacy Assistant   Name: Tim Hodge  MRN: [id number] DOB: 1956/01/09  Reason for Encounter: Patient Assistance Update-Trulicity   Recent office visits:  09/25/20- PCP - Ordered the following labs: TSH, CBC with Diff, CMP, A1C, Lipid panel. Changed Januvia to Trulicity  Recent consult visits:  None since last CCM contact  Hospital visits:  None in previous 6 months  Medications: Outpatient Encounter Medications as of 09/28/2020  Medication Sig   gabapentin (NEURONTIN) 300 MG capsule Take 1 capsule (300 mg total) by mouth 3 (three) times daily.   glimepiride (AMARYL) 4 MG tablet TAKE 2 TABLETS BY MOUTH ONCE DAILY WITH BREAKFAST   Krill Oil 1000 MG CAPS Take 1,000 mg by mouth.   lisinopril (ZESTRIL) 5 MG tablet Take 1 tablet (5 mg total) by mouth daily.   rivaroxaban (XARELTO) 20 MG TABS tablet TAKE 1 TABLET BY MOUTH ONCE DAILY WITH SUPPER   sitaGLIPtin (JANUVIA) 100 MG tablet Take 1 tablet (100 mg total) by mouth daily.   No facility-administered encounter medications on file as of 09/28/2020.   Contacted patient to inquire if he has been able to complete his Trulicity application for assistance. Also informed patient that Dr. Damita Dunnings would like him to go ahead and start Trulicity. This has been sent to his local pharmacy. Once he starts Trulicity he should discontinue the Januvia. Patient verified understanding. Patient states he will go to [facility] to sign application and provide income information 09/29/20. Application has been sent to [place].   [name], CPP notified  Margaretmary Dys, [place] Pharmacy Assistant [phone]

## 2020-09-29 ENCOUNTER — Telehealth: Payer: Self-pay

## 2020-09-29 MED ORDER — TRULICITY 0.75 MG/0.5ML ~~LOC~~ SOAJ
0.7500 mg | SUBCUTANEOUS | 5 refills | Status: DC
Start: 2020-09-29 — End: 2020-11-21

## 2020-09-29 NOTE — Telephone Encounter (Signed)
Sent. Thanks.   

## 2020-09-29 NOTE — Telephone Encounter (Signed)
See staff message below - Please go ahead and order Trulicity 5.83 mg Inject once weekly to Tim Hodge. Patient is awaiting prescription and knows it will replace Januvia. Also reminded him about assistance application to help with cost.    Tonia Ghent, MD  [name], [facility] This patient is still on januvia.  Not yet on trulicity.  What is the situation with getting him changed over to Trulicity?  I greatly appreciate your help.   Brigitte Pulse

## 2020-10-09 ENCOUNTER — Telehealth: Payer: Self-pay

## 2020-10-09 NOTE — Chronic Care Management (AMB) (Addendum)
    Chronic Care Management Pharmacy Assistant   Name: Tim Hodge  MRN: [id number] DOB: 12/16/1955  Reason for Encounter: CCM Reminder Call  Recent office visits:  09/25/20 - Dr.Duncan, PCP - Labs ordered, no medication changes   Recent consult visits:  None since last CCM contact  Hospital visits:  None in previous 6 months  Medications: Outpatient Encounter Medications as of 10/09/2020  Medication Sig   Dulaglutide (TRULICITY) 3.66 KD/5.9EL SOPN Inject 0.75 mg into the skin once a week.   gabapentin (NEURONTIN) 300 MG capsule Take 1 capsule (300 mg total) by mouth 3 (three) times daily.   glimepiride (AMARYL) 4 MG tablet TAKE 2 TABLETS BY MOUTH ONCE DAILY WITH BREAKFAST   Krill Oil 1000 MG CAPS Take 1,000 mg by mouth.   lisinopril (ZESTRIL) 5 MG tablet Take 1 tablet (5 mg total) by mouth daily.   rivaroxaban (XARELTO) 20 MG TABS tablet TAKE 1 TABLET BY MOUTH ONCE DAILY WITH SUPPER   No facility-administered encounter medications on file as of 10/09/2020.    Tim Hodge  did not answer to remind him of his upcoming telephone visit with [name] on 10/11/2020 at 1:00 PM. The patient's mailbox was full. Unable to leave a message.   Star Rating Drugs: Medication:   Last Fill: Day Supply Trulicity 0.75mg /0.30ml 09/29/20 28  No appointments scheduled within the next 30 days.   [name], CPP notified  [name], [place] Assistant [phone]  I have reviewed the care management and care coordination activities outlined in this encounter and I am certifying that I agree with the content of this note. No further action required.  [name], PharmD Clinical Pharmacist [facility] at [facility] [phone]

## 2020-10-11 ENCOUNTER — Telehealth: Payer: Medicare PPO

## 2020-11-06 ENCOUNTER — Telehealth: Payer: Self-pay

## 2020-11-06 NOTE — Chronic Care Management (AMB) (Addendum)
Chronic Care Management Pharmacy Assistant   Name: Tim Hodge  MRN: [id number] DOB: 1956-01-09   Reason for Encounter: Disease State DM  Recent office visits:  09/25/20 -Dr.Duncan PCP  labs ordered no medication changes  Recent consult visits:  None since last CCM contact  Hospital visits:  None in previous 6 months  Medications: Outpatient Encounter Medications as of 11/06/2020  Medication Sig   Dulaglutide (TRULICITY) 2.20 UR/4.2HC SOPN Inject 0.75 mg into the skin once a week.   gabapentin (NEURONTIN) 300 MG capsule Take 1 capsule (300 mg total) by mouth 3 (three) times daily.   glimepiride (AMARYL) 4 MG tablet TAKE 2 TABLETS BY MOUTH ONCE DAILY WITH BREAKFAST   Krill Oil 1000 MG CAPS Take 1,000 mg by mouth.   lisinopril (ZESTRIL) 5 MG tablet Take 1 tablet (5 mg total) by mouth daily.   rivaroxaban (XARELTO) 20 MG TABS tablet TAKE 1 TABLET BY MOUTH ONCE DAILY WITH SUPPER   No facility-administered encounter medications on file as of 11/06/2020.     Recent Relevant Labs: Lab Results  Component Value Date/Time   HGBA1C 7.9 (H) 09/25/2020 08:44 AM   HGBA1C 10.2 (A) 06/20/2020 10:37 AM   HGBA1C 8.7 (A) 03/13/2020 08:48 AM   HGBA1C 8.3 (H) 11/02/2019 07:44 AM    Kidney Function Lab Results  Component Value Date/Time   CREATININE 0.91 09/25/2020 08:44 AM   CREATININE 0.94 11/02/2019 07:44 AM   GFR 89.18 09/25/2020 08:44 AM   GFRNONAA >60 07/09/2016 03:43 PM   GFRAA >60 07/09/2016 03:43 PM     Contacted patient on 11/15/2020 to discuss diabetes disease state.   Current antihyperglycemic regimen:  Glimepiride 4 mg - 2 tablets (8 mg) daily with breakfast Trulicity 0.75mg  - Inject once weekly (Sundays)    Patient verbally confirms he is taking the above medications as directed. Yes the patient reports he is on the second month of the trulicity and he did see a decrease in morning blood sugars at first and now the sugars stay 200 and above most days   What diet  changes have been made to improve diabetes control?none identified   What recent interventions/DTPs have been made to improve glycemic control:  10/13/74 started Trulicity 0.75mg  inject once weekly   Have there been any recent hospitalizations or ED visits since last visit with CPP? No  Patient denies hypoglycemic symptoms, including Pale, Sweaty, Shaky, Hungry, Nervous/irritable, and Vision changes  Patient denies hyperglycemic symptoms, including blurry vision, excessive thirst, fatigue, polyuria, and weakness  How often are you checking your blood sugar? in the morning before eating or drinking  What are your blood sugars ranging?  Fasting:  currently averaging 200 - 245  After meals: n/a Bedtime: n/a  During the week, how often does your blood glucose drop below 70? Never  Are you checking your feet daily/regularly? Yes  Adherence Review: Is the patient currently on a STATIN medication? No Is the patient currently on ACE/ARB medication? Yes Does the patient have >5 day gap between last estimated fill dates? No Contacted [place] and confirmed last refill date   Care Gaps: Last annual wellness visit: 11/08/19 Last eye exam / retinopathy screening: up to date 06/20/20 Last diabetic foot exam: 06/20/20  Counseled patient on importance of annual eye and foot exam.  Star Rating Drugs:  Medication:  Last Fill: Day Supply Glimepiride 4mg  10/31/20 30 Lisinopril 5mg   2/83/15 30 Trulicity  1/76/16( getting from patient assistance)   PCP appointment on  01/04/21  [name], CPP notified  [name], [place] Assistant [phone]  I have reviewed the care management and care coordination activities outlined in this encounter and I am certifying that I agree with the content of this note. See addendum.  [name], PharmD Clinical Pharmacist [facility] at [facility] [phone]

## 2020-11-10 ENCOUNTER — Telehealth: Payer: Self-pay

## 2020-11-10 NOTE — Telephone Encounter (Addendum)
He was instructed to stop Januvia and start Trulicity 0000000. They can discontinue Januvia. He has not answered calls to verify how this is going. Thanks

## 2020-11-10 NOTE — Telephone Encounter (Signed)
Mier (individual calling did not leave a name) left v/m wanting to know if pt is taking Januvia 100 mg or has pt been switched totally to Trulicity. Please seen 09/29/20 phone note. Sending to [name] [facility].

## 2020-11-10 NOTE — Telephone Encounter (Signed)
I called the pharmacy back and they were able to resolve this issue with the patient. He has not been taking the [place] and is on the trulicity now.

## 2020-11-16 ENCOUNTER — Telehealth: Payer: Self-pay

## 2020-11-16 NOTE — Telephone Encounter (Signed)
Trulicity [id number] mg weekly application completed and faxed to [facility] Patient Assistance Program.

## 2020-11-20 ENCOUNTER — Telehealth: Payer: Self-pay

## 2020-11-20 NOTE — Chronic Care Management (AMB) (Addendum)
    Chronic Care Management Pharmacy Assistant   Name: Tim Hodge  MRN: [id number] DOB: Nov 28, 1955   Reason for Encounter: Reminder Call   Conditions to be addressed/monitored: DMII  Medications: Outpatient Encounter Medications as of 11/20/2020  Medication Sig   Dulaglutide (TRULICITY) A999333 0000000 SOPN Inject 0.75 mg into the skin once a week.   gabapentin (NEURONTIN) 300 MG capsule Take 1 capsule (300 mg total) by mouth 3 (three) times daily.   glimepiride (AMARYL) 4 MG tablet TAKE 2 TABLETS BY MOUTH ONCE DAILY WITH BREAKFAST   Krill Oil 1000 MG CAPS Take 1,000 mg by mouth.   lisinopril (ZESTRIL) 5 MG tablet Take 1 tablet (5 mg total) by mouth daily.   rivaroxaban (XARELTO) 20 MG TABS tablet TAKE 1 TABLET BY MOUTH ONCE DAILY WITH SUPPER   No facility-administered encounter medications on file as of 11/20/2020.   Tim Hodge was contacted to remind him of his upcoming telephone visit with [name] on 11/21/2020 at 10:45am. Patient was reminded to have all medications, supplements and any blood glucose and blood pressure readings available for review at appointment.   Are you having any problems with your medications? No  Do you have any concerns you like to discuss with the pharmacist? The patient reports some high blood sugar readings     Star Rating Drugs: Medication:  Last Fill: Day Supply Lisinopril '5mg'$   01/26/2020 30 Glimepiride '4mg'$  0000000 30 Trulicity  Working on PAP  [name], CPP notified  [name], [place] Assistant [phone]  I have reviewed the care management and care coordination activities outlined in this encounter and I am certifying that I agree with the content of this note. No further action required.  [name], PharmD Clinical Pharmacist [facility] at [facility] [phone]

## 2020-11-20 NOTE — Progress Notes (Signed)
Entered in Error

## 2020-11-21 ENCOUNTER — Telehealth: Payer: Self-pay

## 2020-11-21 ENCOUNTER — Ambulatory Visit (INDEPENDENT_AMBULATORY_CARE_PROVIDER_SITE_OTHER): Payer: Medicare PPO

## 2020-11-21 ENCOUNTER — Other Ambulatory Visit: Payer: Self-pay

## 2020-11-21 DIAGNOSIS — I10 Essential (primary) hypertension: Secondary | ICD-10-CM

## 2020-11-21 DIAGNOSIS — E119 Type 2 diabetes mellitus without complications: Secondary | ICD-10-CM | POA: Diagnosis not present

## 2020-11-21 MED ORDER — TRULICITY 0.75 MG/0.5ML ~~LOC~~ SOAJ
1.5000 mg | SUBCUTANEOUS | Status: DC
Start: 2020-11-21 — End: 2021-05-08

## 2020-11-21 NOTE — Addendum Note (Signed)
Addended by: Tonia Ghent on: 11/21/2020 01:55 PM   Modules accepted: Orders

## 2020-11-21 NOTE — Telephone Encounter (Signed)
Discussed Trulicity dose increase with PCP. He agrees to increase from 0.75 mg to 1.5 mg weekly due to fasting BG 240s. Faxed updated prescription to [facility] for patient assistance. Sending to PCP to update prescription in chart.  [name], PharmD Clinical Pharmacist [facility] at [facility] [phone]

## 2020-11-21 NOTE — Patient Instructions (Signed)
Dear Tim Hodge,  Below is a summary of the goals we discussed during our follow up appointment on November 21, 2020. Please contact me anytime with questions or concerns.   Visit Information  Patient Care Plan: CCM Pharmacy Care Plan     Problem Identified: CHL AMB "PATIENT-SPECIFIC PROBLEM"      Long-Range Goal: Disease Management   Start Date: 08/31/2020  Priority: High  Note:   Current Barriers:  Suboptimal therapeutic regimen for diabetes  Pharmacist Clinical Goal(s):  Patient will adhere to plan to optimize therapeutic regimen for diabetes as evidenced by report of adherence to recommended medication management changes through collaboration with PharmD and provider.   Interventions: 1:1 collaboration with Tonia Ghent, MD regarding development and update of comprehensive plan of care as evidenced by provider attestation and co-signature Inter-disciplinary care team collaboration (see longitudinal plan of care) Comprehensive medication review performed; medication list updated in electronic medical record  Hypertension (BP goal <140/90) -Stable, condition not addressed at today's visit -Controlled - clinic readings within goal  -Current treatment: Lisinopril 5 mg - 1 tablet daily -Medications previously tried: none  -Current home readings: does not have home monitor  -Recommended to continue current medication  Hyperlipidemia: (LDL goal < 100) -Stable, condition not addressed at today's visit -Controlled - LDL 86 -Current treatment: Fish Oil 1000 mg - 1 capsule daily -Medications previously tried: none - statin intolerant, pt describes very significant muscle cramps  -Recommended to continue current medication  Diabetes (A1c goal <7%) -Uncontrolled - A1c 7.9% improving 06/22, however, fasting glucose still very high 240s. He has taken 6 doses of Trulicity A999333 mg so far and reports doing well. He stopped Januvia as recommended. Continues glimepiride. He has 2  Trulicity pens left at current dose. -Current medications: Glimepiride 4 mg - 2 tablets (8 mg) daily with breakfast Trulicity A999333 mg - Inject once weekly Sundays  -Medications previously tried: Januvia 100 mg - 1 tablet daily, metformin  -Current home glucose readings  fasting glucose: 245-255 in the morning (reports this is an improvement for him - he thinks his numbers have come down since he started Trulicity, first week particularly, although this differs from out last discussion) post prandial glucose: none  -Denies hypoglycemic/hyperglycemic symptoms -Current meal patterns: He continues to cut back on "everything". Trying to lose weight by cutting back on portions, limiting bread  breakfast: 2 eggs, oatmeal lunch: sandwich or skips dinner: home cooked meal, pinto beans, spaghetti, limits fried foods, red meat once a week, eats fish on occasion snacks: rarely snacks, if so, eats yogurt drinks: a lot of water, diet soda on occasion  -Current exercise: stays active fishing, gardening -Updated patient on PAP status. Faxed Trulicity PAP [phone], awaiting answer. He is open to Trulicity dose increase. -Counseled to check feet daily and get yearly eye exams - DUE for eye exam -Recommend increase Trulicity to 1.5 mg weekly.  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   [name], PharmD Clinical Pharmacist [facility] at [facility] [phone]

## 2020-11-21 NOTE — Telephone Encounter (Signed)
Agree. Thanks

## 2020-11-21 NOTE — Progress Notes (Addendum)
Per Michelle's request, contacted [employer]. Pt approved through [id number] for Trulicity [id number] mg weekly. Asked not to ship the A999333 mg Trulicity as the dose has been increased to 1.5 mg weekly. Per the foundation no shipment until they receive the new RX.   [name], CPP notified  [name], [place] Assistant [phone]

## 2020-11-21 NOTE — Progress Notes (Signed)
Chronic Care Management Pharmacy Note  11/21/20 Name:  Tim Hodge MRN:  [id number] DOB:  05-Mar-1956  Subjective: Tim Hodge is an 65 y.o. year old male who is a primary patient of Damita Dunnings, Elveria Rising, MD.  The CCM team was consulted for assistance with disease management and care coordination needs.    Engaged with patient by telephone for follow up visit in response to provider referral for pharmacy case management and/or care coordination services.   Consent to Services:  The patient was given information about Chronic Care Management services, agreed to services, and gave verbal consent prior to initiation of services.  Please see initial visit note for detailed documentation.   Patient Care Team: Tonia Ghent, MD as PCP - General (Family Medicine) Phylliss Bob, MD as Consulting Physician (Orthopedic Surgery) [name], [facility] as Pharmacist (Pharmacist)  Recent office visits:  09/25/2020 - Patsi Sears, PCP - Check labs, not yet on Trulicity. Will check with pharmacy. 06/20/2020 - Dr.Graham Damita Dunnings, PCP -  I will check with pharmacy about switching him to Trulicity instead of Januvia. We may need to eventually switch out his glimepiride but I think it probably makes sense to check on Trulicity first. Send copy of eye exam. F/u 3 months.   Recent consult visits:  06/30/2020 -  Daylene Katayama, DPM - No medication changes   Hospital visits: None in previous 6 months  Objective:  Allergies  Allergen Reactions   Iodinated Diagnostic Agents Shortness Of Breath and Rash   Flexeril [Cyclobenzaprine]     Intolerant- sedation.     Iodine    Tramadol Other (See Comments)    Ineffective for pain.    Vioxx [Rofecoxib] Other (See Comments)    LFT elevation   Metformin And Related Other (See Comments)    Muscle cramps   Valium [Diazepam] Rash    Rash     Lab Results  Component Value Date   CREATININE 0.91 09/25/2020   BUN 14 09/25/2020   GFR 89.18 09/25/2020    GFRNONAA >60 07/09/2016   GFRAA >60 07/09/2016   NA 136 09/25/2020   K 4.4 09/25/2020   CALCIUM 8.7 09/25/2020   CO2 24 09/25/2020   GLUCOSE 396 (H) 09/25/2020    Lab Results  Component Value Date/Time   HGBA1C 7.9 (H) 09/25/2020 08:44 AM   HGBA1C 10.2 (A) 06/20/2020 10:37 AM   HGBA1C 8.7 (A) 03/13/2020 08:48 AM   HGBA1C 8.3 (H) 11/02/2019 07:44 AM   GFR 89.18 09/25/2020 08:44 AM   GFR 80.94 11/02/2019 07:44 AM    Last diabetic Eye exam:  Lab Results  Component Value Date/Time   HMDIABEYEEXA No Retinopathy 05/18/2018 12:00 AM    Last diabetic Foot exam:  11/08/19 PCP  Lab Results  Component Value Date   CHOL 167 09/25/2020   HDL 45.90 09/25/2020   LDLCALC 98 09/25/2020   LDLDIRECT 117.8 02/07/2014   TRIG 116.0 09/25/2020   CHOLHDL 4 09/25/2020    Hepatic Function Latest Ref Rng & Units 09/25/2020 11/02/2019 11/02/2018  Total Protein 6.0 - 8.3 g/dL 5.8(L) 6.2 6.0  Albumin 3.5 - 5.2 g/dL 3.2(L) 3.5 3.6  AST 0 - 37 U/L 42(H) 39(H) 40(H)  ALT 0 - 53 U/L 33 34 46  Alk Phosphatase 39 - 117 U/L 172(H) 147(H) 151(H)  Total Bilirubin 0.2 - 1.2 mg/dL 1.1 1.4(H) 1.1    Lab Results  Component Value Date/Time   TSH 1.09 09/25/2020 08:44 AM    CBC Latest Ref  Rng & Units 09/25/2020 11/02/2019 05/10/2019  WBC 4.0 - 10.5 K/uL 3.5(L) 4.9 4.2  Hemoglobin 13.0 - 17.0 g/dL 13.5 13.9 14.3  Hematocrit 39.0 - 52.0 % 39.5 40.8 42.7  Platelets 150.0 - 400.0 K/uL 115.0(L) 115.0(L) 143.0(L)    No results found for: VD25OH  Clinical ASCVD: No  The 10-year ASCVD risk score Mikey Bussing DC Jr., et al., 2013) is: 18.6%   Values used to calculate the score:     Age: 66 years     Sex: Male     Is Non-Hispanic African American: No     Diabetic: Yes     Tobacco smoker: No     Systolic Blood Pressure: 063 mmHg     Is BP treated: Yes     HDL Cholesterol: 45.9 mg/dL     Total Cholesterol: 167 mg/dL    Depression screen [facility] 2/9 03/13/2020 02/05/2019 01/09/2016  Decreased Interest 0 0 0  Down,  Depressed, Hopeless 0 0 0  PHQ - 2 Score 0 0 0    Social History   Tobacco Use  Smoking Status Former   Types: Cigarettes  Smokeless Tobacco Never   BP Readings from Last 3 Encounters:  09/25/20 112/62  06/20/20 122/68  03/13/20 118/70   Pulse Readings from Last 3 Encounters:  09/25/20 72  06/20/20 72  03/13/20 90   Wt Readings from Last 3 Encounters:  09/25/20 (!) 318 lb (144.2 kg)  06/20/20 (!) 319 lb (144.7 kg)  03/13/20 (!) 318 lb 14.4 oz (144.7 kg)   BMI Readings from Last 3 Encounters:  09/25/20 41.96 kg/m  06/20/20 42.09 kg/m  03/13/20 42.07 kg/m    Assessment/Interventions: Review of patient past medical history, allergies, medications, health status, including review of consultants reports, laboratory and other test data, was performed as part of comprehensive evaluation and provision of chronic care management services.   SDOH:  (Social Determinants of Health) assessments and interventions performed: Yes SDOH Interventions    Flowsheet Row Most Recent Value  SDOH Interventions   Financial Strain Interventions Other (Comment)  [Trulicity PAP in process]       SDOH Screenings   Alcohol Screen: Not on file  Depression (PHQ2-9): Low Risk    PHQ-2 Score: 0  Financial Resource Strain: Medium Risk   Difficulty of Paying Living Expenses: Somewhat hard  Food Insecurity: Not on file  Housing: Not on file  Physical Activity: Not on file  Social Connections: Not on file  Stress: Not on file  Tobacco Use: Medium Risk   Smoking Tobacco Use: Former   Smokeless Tobacco Use: Never  Transportation Needs: Not on file    [place]  Allergies  Allergen Reactions   Iodinated Diagnostic Agents Shortness Of Breath and Rash   Flexeril [Cyclobenzaprine]     Intolerant- sedation.     Iodine    Tramadol Other (See Comments)    Ineffective for pain.    Vioxx [Rofecoxib] Other (See Comments)    LFT elevation   Metformin And Related Other (See Comments)     Muscle cramps   Valium [Diazepam] Rash    Rash    Medications Reviewed Today     Reviewed by [name], [facility] (Pharmacist) on 11/21/20 at 1531  Med List Status: <None>   Medication Order Taking? Sig Documenting Provider Last Dose Status Informant  Dulaglutide (TRULICITY) 0.16 WF/0.9NA SOPN [id number] Yes Inject 1.5 mg into the skin once a week. Tonia Ghent, MD Taking Active   gabapentin (NEURONTIN) 300  MG capsule [id number] Yes Take 1 capsule (300 mg total) by mouth 3 (three) times daily. Tonia Ghent, MD Taking Active   glimepiride (AMARYL) 4 MG tablet [id number] Yes TAKE 2 TABLETS BY MOUTH ONCE DAILY WITH BREAKFAST Tonia Ghent, MD Taking Active   Krill Oil 1000 MG CAPS [id number] Yes Take 1,000 mg by mouth. [provider] Taking Active   lisinopril (ZESTRIL) 5 MG tablet [id number] Yes Take 1 tablet (5 mg total) by mouth daily. Tonia Ghent, MD Taking Active   rivaroxaban Alveda Reasons) 20 MG TABS tablet [id number] Yes TAKE 1 TABLET BY MOUTH ONCE DAILY WITH SUPPER Tonia Ghent, MD Taking Active             Patient Active Problem List   Diagnosis Date Noted   Thrombocytopenia ([place]) 11/10/2019   Paresthesia 02/07/2019   Hx of adenomatous colonic polyps 12/07/2018   Fatty liver 11/19/2018   LFT elevation 11/08/2018   Encounter for general adult medical examination with abnormal findings 04/03/2016   Advance care planning 04/03/2016   Trigger finger, acquired 04/03/2016   Severe obesity (BMI >= 40) ([place]) 02/09/2014   Morbid obesity ([place]) 02/09/2014   Colon polyps 01/07/2014   History of DVT of lower extremity 01/07/2014   HTN (hypertension) 12/01/2013   Diabetes mellitus without complication ([place]) 32/03/2481   Chronic back pain 12/01/2013   History of pulmonary embolism 12/01/2013   Abnormal EKG 03/01/2013   PVC's (premature ventricular contractions) 03/01/2013    Immunization History  Administered Date(s) Administered   Influenza,inj,Quad PF,6+  Mos 03/06/2015, 02/21/2017, 01/16/2018, 02/05/2019, 03/13/2020   Influenza-Unspecified 02/03/2014   Moderna Sars-Covid-2 Vaccination 07/21/2019, 08/18/2019, 04/04/2020   Pneumococcal Polysaccharide-23 04/22/2012   Tdap 04/22/2009, 10/07/2014    Conditions to be addressed/monitored:  Hypertension, Hyperlipidemia and Diabetes  Patient Care Plan: CCM Pharmacy Care Plan     Problem Identified: CHL AMB "PATIENT-SPECIFIC PROBLEM"      Long-Range Goal: Disease Management   Start Date: 08/31/2020  Priority: High  Note:   Current Barriers:  Suboptimal therapeutic regimen for diabetes  Pharmacist Clinical Goal(s):  Patient will adhere to plan to optimize therapeutic regimen for diabetes as evidenced by report of adherence to recommended medication management changes through collaboration with PharmD and provider.   Interventions: 1:1 collaboration with Tonia Ghent, MD regarding development and update of comprehensive plan of care as evidenced by provider attestation and co-signature Inter-disciplinary care team collaboration (see longitudinal plan of care) Comprehensive medication review performed; medication list updated in electronic medical record  Hypertension (BP goal <140/90) -Stable, condition not addressed at today's visit -Controlled - clinic readings within goal  -Current treatment: Lisinopril 5 mg - 1 tablet daily -Medications previously tried: none  -Current home readings: does not have home monitor  -Recommended to continue current medication  Hyperlipidemia: (LDL goal < 100) -Stable, condition not addressed at today's visit -Controlled - LDL 86 -Current treatment: Fish Oil 1000 mg - 1 capsule daily -Medications previously tried: none - statin intolerant, pt describes very significant muscle cramps  -Recommended to continue current medication  Diabetes (A1c goal <7%) -Uncontrolled - A1c 7.9% improving 06/22, however, fasting glucose still very high 240s. He has  taken 6 doses of Trulicity 5.00 mg so far and reports doing well. He stopped Januvia as recommended. Continues glimepiride. He has 2 Trulicity pens left at current dose. -Current medications: Glimepiride 4 mg - 2 tablets (8 mg) daily with breakfast Trulicity 3.70 mg - Inject once weekly Sundays  -Medications previously  tried: Januvia 100 mg - 1 tablet daily, metformin  -Current home glucose readings  fasting glucose: 245-255 in the morning (reports this is an improvement for him - he thinks his numbers have come down since he started Trulicity, first week particularly, although this differs from out last discussion) post prandial glucose: none  -Denies hypoglycemic/hyperglycemic symptoms -Current meal patterns: He continues to cut back on "everything". Trying to lose weight by cutting back on portions, limiting bread  breakfast: 2 eggs, oatmeal lunch: sandwich or skips dinner: home cooked meal, pinto beans, spaghetti, limits fried foods, red meat once a week, eats fish on occasion snacks: rarely snacks, if so, eats yogurt drinks: a lot of water, diet soda on occasion  -Current exercise: stays active fishing, gardening -Updated patient on PAP status. Faxed Trulicity PAP 3/15, awaiting answer. He is open to Trulicity dose increase. -Counseled to check feet daily and get yearly eye exams - DUE for eye exam -Recommend increase Trulicity to 1.5 mg weekly.  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  Patient's preferred pharmacy is:  [facility], [address] [place] [address] Phone: [phone] Fax: [phone]  Uses pill box? Yes Pt endorses 100% compliance - missed evening meds once last week, first missed dose in > 1 year Patient is very happy with current pharmacy services.  Care Plan and Follow Up Patient Decision:  Patient agrees to Care Plan and Follow-up.  [name], PharmD Clinical Pharmacist [place] [facility] at [facility] [phone]

## 2020-12-04 NOTE — Telephone Encounter (Signed)
Shipment received from [employer] of 1.'5mg'$  Trulicity but refrigeration packaging was damaged and medication was not refrigerated immediately upon receipt.  [place] and spoke with Bethena Roys who reported a new order will be shipped today and arrive at the clinic on 12/06/20 by [employer] with no signature needed. Arrangements will also be made for UPS to pick up the damaged shipment of 1.5 mg trulicity so it can be shipped back.  Damaged trulicity shipment placed in practice administrators office.

## 2020-12-04 NOTE — Telephone Encounter (Signed)
Noted. Thanks.

## 2020-12-07 NOTE — Telephone Encounter (Signed)
Patient came in this afternoon and pick up medication.

## 2020-12-07 NOTE — Telephone Encounter (Signed)
Mr. Battenfield called in returning [place] phone call. Related the message and he stated that he will be in office later to pick up the medication

## 2020-12-07 NOTE — Telephone Encounter (Signed)
New shipment of trulicity arrived today and placed in fridge for patient to pick up.   I left message for patient that rx was here at the office ready for pickup.

## 2020-12-18 ENCOUNTER — Other Ambulatory Visit: Payer: Self-pay | Admitting: Family Medicine

## 2020-12-19 NOTE — Telephone Encounter (Signed)
Last OV 09/25/20 Last refill 03/13/20 #30/12 Next OV 01/04/21

## 2020-12-22 ENCOUNTER — Telehealth: Payer: Self-pay

## 2020-12-22 NOTE — Chronic Care Management (AMB) (Addendum)
Chronic Care Management Pharmacy Assistant   Name: Tim Hodge  MRN: [id number] DOB: 05-11-1955  Reason for Encounter: Diabetes Review   Recent office visits:  None since last CCM contact  Recent consult visits:  None since last CCM contact  Hospital visits:  None in previous 6 months  Medications: Outpatient Encounter Medications as of 12/22/2020  Medication Sig   Dulaglutide (TRULICITY) A999333 0000000 SOPN Inject 1.5 mg into the skin once a week.   gabapentin (NEURONTIN) 300 MG capsule Take 1 capsule (300 mg total) by mouth 3 (three) times daily.   glimepiride (AMARYL) 4 MG tablet TAKE 2 TABLETS BY MOUTH ONCE DAILY WITH BREAKFAST   Krill Oil 1000 MG CAPS Take 1,000 mg by mouth.   lisinopril (ZESTRIL) 5 MG tablet Take 1 tablet (5 mg total) by mouth daily.   XARELTO 20 MG TABS tablet TAKE 1 TABLET BY MOUTH ONCE DAILY WITH SUPPER   No facility-administered encounter medications on file as of 12/22/2020.    Recent Relevant Labs: Lab Results  Component Value Date/Time   HGBA1C 7.9 (H) 09/25/2020 08:44 AM   HGBA1C 10.2 (A) 06/20/2020 10:37 AM   HGBA1C 8.7 (A) 03/13/2020 08:48 AM   HGBA1C 8.3 (H) 11/02/2019 07:44 AM    Kidney Function Lab Results  Component Value Date/Time   CREATININE 0.91 09/25/2020 08:44 AM   CREATININE 0.94 11/02/2019 07:44 AM   GFR 89.18 09/25/2020 08:44 AM   GFRNONAA >60 07/09/2016 03:43 PM   GFRAA >60 07/09/2016 03:43 PM     Contacted patient on 12/26/20  to discuss diabetes disease state.   Current antihyperglycemic regimen:  Glimepiride 4 mg - 2 tablets (8 mg) daily with breakfast Trulicity 1.'5mg'$  - Inject once weekly (Sundays)    Patient verbally confirms he is taking the above medications as directed. Yes  What diet changes have been made to improve diabetes control? The patient is eating smaller portions, no snacks, lots of water  What recent interventions/DTPs have been made to improve glycemic control: 11/21/20- CPP recommend increase  Trulicity to 1.'5mg'$  weekly   Have there been any recent hospitalizations or ED visits since last visit with CPP? No  Patient denies hypoglycemic symptoms, including Pale, Sweaty, Shaky, Hungry, Nervous/irritable, and Vision changes  Patient denies hyperglycemic symptoms, including blurry vision, excessive thirst, fatigue, polyuria, and weakness  How often are you checking your blood sugar? once daily  What are your blood sugars ranging?  Fasting: 100,96,130,160  During the week, how often does your blood glucose drop below 70? Never  Are you checking your feet daily/regularly? Yes  Adherence Review: Is the patient currently on a STATIN medication? No Is the patient currently on ACE/ARB medication? Yes Does the patient have >5 day gap between last estimated fill dates? Yes Last fill date obtained from [facility]: Last annual wellness visit:11/08/19 Last eye exam / retinopathy screening:11/02/19 Last diabetic foot exam: 11/08/19  Counseled patient on importance of annual eye and foot exam.   Star Rating Drugs:  Medication:  Last Fill: Day Supply Glimepiride '4mg'$           12/04/20 60 Lisinopril '5mg'$               [id number] 30 Trulicity 1.5 mg            09/29/20  (PAP - 9/6 patient has 90ds at office now)   PCP appointment on 01/04/21  Tim Hodge, CPP notified  Tim Hodge, [place] Assistant [phone]  I have reviewed the care management and care coordination activities outlined in this encounter and I am certifying that I agree with the content of this note. Will review lisinopril adherence next month since past due for refill.  Tim Hodge, PharmD Clinical Pharmacist [facility] at [facility] [phone]

## 2021-01-04 ENCOUNTER — Ambulatory Visit: Payer: Medicare PPO | Admitting: Family Medicine

## 2021-01-13 ENCOUNTER — Ambulatory Visit (INDEPENDENT_AMBULATORY_CARE_PROVIDER_SITE_OTHER): Payer: Medicare PPO

## 2021-01-13 DIAGNOSIS — Z0000 Encounter for general adult medical examination without abnormal findings: Secondary | ICD-10-CM

## 2021-01-13 NOTE — Progress Notes (Addendum)
Subjective:   Tim Hodge is a 65 y.o. male who presents for an Initial Medicare Annual Wellness Visit.  I connected with  Tim Hodge on 01/13/21 by a audio enabled telemedicine application and verified that I am speaking with the correct person using two identifiers.   I discussed the limitations of evaluation and management by telemedicine. The patient expressed understanding and agreed to proceed. Location of patient: Home Location of provider: Office  Persons that are participating in the visit Tim Hodge (patient) and Beckie Salts, RMA   Review of Systems    Defer to PCP       Objective:    There were no vitals filed for this visit. There is no height or weight on file to calculate BMI.  Advanced Directives 11/10/2019 07/09/2016 07/04/2016 12/11/2015  Does Patient Have a Medical Advance Directive? No No No No  Would patient like information on creating a medical advance directive? - No - Patient declined No - Patient declined -    Current Medications (verified) Outpatient Encounter Medications as of 01/13/2021  Medication Sig   Dulaglutide (TRULICITY) 9.97 FS/1.4EL SOPN Inject 1.5 mg into the skin once a week.   gabapentin (NEURONTIN) 300 MG capsule Take 1 capsule (300 mg total) by mouth 3 (three) times daily.   glimepiride (AMARYL) 4 MG tablet TAKE 2 TABLETS BY MOUTH ONCE DAILY WITH BREAKFAST   Krill Oil 1000 MG CAPS Take 1,000 mg by mouth.   lisinopril (ZESTRIL) 5 MG tablet Take 1 tablet (5 mg total) by mouth daily.   XARELTO 20 MG TABS tablet TAKE 1 TABLET BY MOUTH ONCE DAILY WITH SUPPER   No facility-administered encounter medications on file as of 01/13/2021.    Allergies (verified) Iodinated diagnostic agents, Flexeril [cyclobenzaprine], Iodine, Tramadol, Vioxx [rofecoxib], Metformin and related, and Valium [diazepam]   History: Past Medical History:  Diagnosis Date   Acute venous embolism and thrombosis of unspecified deep vessels of lower extremity     Compression fx, lumbar spine ([place])    2017   Elevated blood pressure reading without diagnosis of hypertension    Headache    Lumbago    Polyp of colon    Pulmonary embolism ([place])    Type II or unspecified type diabetes mellitus without mention of complication, not stated as uncontrolled    Unspecified arthropathy, ankle and foot    Past Surgical History:  Procedure Laterality Date   COLONOSCOPY WITH PROPOFOL N/A 11/10/2019   Procedure: COLONOSCOPY WITH PROPOFOL;  Surgeon: Toledo, Benay Pike, MD;  Location: [facility] ENDOSCOPY;  Service: Gastroenterology;  Laterality: N/A;   FOOT SURGERY Left    HAND SURGERY Right    KYPHOPLASTY N/A 07/11/2016   Procedure: LUMBAR 2 KYPHOPLASTY;  Surgeon: Phylliss Bob, MD;  Location: [facility];  Service: Orthopedics;  Laterality: N/A;  LUMBAR 2 KYPHOPLASTY   KYPHOPLASTY  2021   LAMINECTOMY  ~1998   Family History  Problem Relation Age of Onset   Breast cancer Mother    Alzheimer's disease Mother    Breast cancer Sister    Stroke Father    Colon cancer Neg Hx    Prostate cancer Neg Hx    Social History   Socioeconomic History   Marital status: Married    Spouse name: Not on file   Number of children: Not on file   Years of education: Not on file   Highest education level: Not on file  Occupational History   Not on file  Tobacco Use  Smoking status: Former    Types: Cigarettes   Smokeless tobacco: Never  Substance and Sexual Activity   Alcohol use: Yes    Comment: rare   Drug use: No   Sexual activity: Not on file  Other Topics Concern   Not on file  Social History Narrative   2 kids, local   [employer], 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  Patient Care Team: Tonia Ghent, MD as PCP - General (Family Medicine) Phylliss Bob, MD as Consulting Physician (Orthopedic Surgery) [name], [facility] as Pharmacist (Pharmacist)  Indicate any recent Medical Services you may have received from other than [facility] providers in the past year (date may be approximate).     Assessment:   This is a routine wellness examination for Tim Hodge.  Hearing/Vision screen No results found.  Dietary issues and exercise activities discussed:     Goals Addressed   None    Depression Screen PHQ 2/9 Scores 03/13/2020 02/05/2019 01/09/2016  PHQ - 2 Score 0 0 0    Fall Risk Fall Risk  03/13/2020 01/09/2016  Falls in the past year? 0 No  Number falls in past yr: 0 -  Injury with Fall? 0 -  Follow up Falls evaluation completed -    FALL RISK PREVENTION PERTAINING TO THE HOME:  Any stairs in or around the home? Yes  If so, are there any without handrails? No  Home free of loose throw rugs in walkways, pet beds, electrical cords, etc? No  Adequate lighting in your home to reduce risk of falls? Yes   ASSISTIVE DEVICES UTILIZED TO PREVENT FALLS:  Life alert? No  Use of a cane, walker or w/c? No  Grab bars in the bathroom? Yes  Shower chair or bench in shower? Yes  Elevated toilet seat or a handicapped toilet? No   TIMED UP AND GO:  Was the test performed? No .       Cognitive Function:        Immunizations Immunization History  Administered Date(s) Administered   Influenza,inj,Quad PF,6+ Mos 03/06/2015, 02/21/2017, 01/16/2018, 02/05/2019, 03/13/2020   Influenza-Unspecified 02/03/2014   Moderna Sars-Covid-2 Vaccination 07/21/2019, 08/18/2019, 04/04/2020   Pneumococcal Polysaccharide-23 04/22/2012   Tdap 04/22/2009, 10/07/2014    TDAP  status: Up to date  Flu Vaccine status: Due, Education has been provided regarding the importance of this vaccine. Advised may receive this vaccine at local pharmacy or Health Dept. Aware to provide a copy of the vaccination record if obtained from local pharmacy or Health Dept. Verbalized acceptance and understanding.  Pneumococcal vaccine status: Up to date  Covid-19 vaccine status: Declined, Education has been provided regarding the importance of this vaccine but patient still declined. Advised may receive this vaccine at local pharmacy or Health Dept.or vaccine clinic. Aware to provide a copy of the vaccination record if obtained from local pharmacy or Health Dept. Verbalized acceptance and understanding.  Qualifies  for Shingles Vaccine? Yes   Zostavax completed Yes   Shingrix Completed?: No.    Education has been provided regarding the importance of this vaccine. Patient has been advised to call insurance company to determine out of pocket expense if they have not yet received this vaccine. Advised may also receive vaccine at local pharmacy or Health Dept. Verbalized acceptance and understanding.  Screening Tests Health Maintenance  Topic Date Due   Zoster Vaccines- Shingrix (1 of 2) Never done   OPHTHALMOLOGY EXAM  05/19/2019   COVID-19 Vaccine (4 - Booster for Moderna series) 08/03/2020   INFLUENZA VACCINE  11/20/2020   HEMOGLOBIN A1C  03/27/2021   FOOT EXAM  06/20/2021   TETANUS/TDAP  10/06/2024   COLONOSCOPY (Pts 45-43yrs Insurance coverage will need to be confirmed)  11/09/2024   Hepatitis C Screening  Completed   HIV Screening  Completed   HPV VACCINES  Aged Out    Health Maintenance  Health Maintenance Due  Topic Date Due   Zoster Vaccines- Shingrix (1 of 2) Never done   OPHTHALMOLOGY EXAM  05/19/2019   COVID-19 Vaccine (4 - Booster for Moderna series) 08/03/2020   INFLUENZA VACCINE  11/20/2020    Colorectal cancer screening: Type of screening: Colonoscopy. Completed  Yes. Repeat every 5 years  Lung Cancer Screening: (Low Dose CT Chest recommended if Age 74-80 years, 30 pack-year currently smoking OR have quit w/in 15years.) does qualify.   Lung Cancer Screening Referral: No  Additional Screening:  Hepatitis C Screening: does qualify; Completed Yes  Vision Screening: Recommended annual ophthalmology exams for early detection of glaucoma and other disorders of the eye. Is the patient up to date with their annual eye exam?  Yes  Who is the provider or what is the name of the office in which the patient attends annual eye exams? 1 year If pt is not established with a provider, would they like to be referred to a provider to establish care? No .   Dental Screening: Recommended annual dental exams for proper oral hygiene  Community Resource Referral / Chronic Care Management: CRR required this visit?  No   CCM required this visit?  No      Plan:     I have personally reviewed and noted the following in the patient's chart:   Medical and social history Use of alcohol, tobacco or illicit drugs  Current medications and supplements including opioid prescriptions. Patient is not currently taking opioid prescriptions. Functional ability and status Nutritional status Physical activity Advanced directives List of other physicians Hospitalizations, surgeries, and ER visits in previous 12 months Vitals Screenings to include cognitive, depression, and falls Referrals and appointments  In addition, I have reviewed and discussed with patient certain preventive protocols, quality metrics, and best practice recommendations. A written personalized care plan for preventive services as well as general preventive health recommendations were provided to patient.     Beckie Salts, [place]   01/13/2021   Nurse Notes: Non face to face 60 minute visit   Tim Hodge , Thank you for taking time to come for your Medicare Wellness Visit. I appreciate your ongoing  commitment to your health goals. Please review the following plan we discussed and let me know if I can assist you in the future.   These are the goals we discussed:  Goals   None     This is a list of the screening recommended for you and due dates:  Health Maintenance  Topic Date Due  Zoster (Shingles) Vaccine (1 of 2) Never done   Eye exam for diabetics  05/19/2019   COVID-19 Vaccine (4 - Booster for Moderna series) 08/03/2020   Flu Shot  11/20/2020   Hemoglobin A1C  03/27/2021   Complete foot exam   06/20/2021   Tetanus Vaccine  10/06/2024   Colon Cancer Screening  11/09/2024   Hepatitis C Screening: USPSTF Recommendation to screen - Ages 18-79 yo.  Completed   HIV Screening  Completed   HPV Vaccine  Aged Out   ================== I have reviewed and agree with note, evaluation, plan.    I was available by phone and digitally at time of visit. Elsie Stain, MD.

## 2021-01-13 NOTE — Patient Instructions (Signed)
Health Maintenance, Male Adopting a healthy lifestyle and getting preventive care are important in promoting health and wellness. Ask your health care provider about: The right schedule for you to have regular tests and exams. Things you can do on your own to prevent diseases and keep yourself healthy. What should I know about diet, weight, and exercise? Eat a healthy diet  Eat a diet that includes plenty of vegetables, fruits, low-fat dairy products, and lean protein. Do not eat a lot of foods that are high in solid fats, added sugars, or sodium. Maintain a healthy weight Body mass index (BMI) is a measurement that can be used to identify possible weight problems. It estimates body fat based on height and weight. Your health care provider can help determine your BMI and help you achieve or maintain a healthy weight. Get regular exercise Get regular exercise. This is one of the most important things you can do for your health. Most adults should: Exercise for at least 150 minutes each week. The exercise should increase your heart rate and make you sweat (moderate-intensity exercise). Do strengthening exercises at least twice a week. This is in addition to the moderate-intensity exercise. Spend less time sitting. Even light physical activity can be beneficial. Watch cholesterol and blood lipids Have your blood tested for lipids and cholesterol at 64 years of age, then have this test every 5 years. You may need to have your cholesterol levels checked more often if: Your lipid or cholesterol levels are high. You are older than 64 years of age. You are at high risk for heart disease. What should I know about cancer screening? Many types of cancers can be detected early and may often be prevented. Depending on your health history and family history, you may need to have cancer screening at various ages. This may include screening for: Colorectal cancer. Prostate cancer. Skin cancer. Lung  cancer. What should I know about heart disease, diabetes, and high blood pressure? Blood pressure and heart disease High blood pressure causes heart disease and increases the risk of stroke. This is more likely to develop in people who have high blood pressure readings, are of African descent, or are overweight. Talk with your health care provider about your target blood pressure readings. Have your blood pressure checked: Every 3-5 years if you are 18-39 years of age. Every year if you are 40 years old or older. If you are between the ages of 65 and 75 and are a current or former smoker, ask your health care provider if you should have a one-time screening for abdominal aortic aneurysm (AAA). Diabetes Have regular diabetes screenings. This checks your fasting blood sugar level. Have the screening done: Once every three years after age 45 if you are at a normal weight and have a low risk for diabetes. More often and at a younger age if you are overweight or have a high risk for diabetes. What should I know about preventing infection? Hepatitis B If you have a higher risk for hepatitis B, you should be screened for this virus. Talk with your health care provider to find out if you are at risk for hepatitis B infection. Hepatitis C Blood testing is recommended for: Everyone born from 1945 through 1965. Anyone with known risk factors for hepatitis C. Sexually transmitted infections (STIs) You should be screened each year for STIs, including gonorrhea and chlamydia, if: You are sexually active and are younger than 64 years of age. You are older than 64 years   of age and your health care provider tells you that you are at risk for this type of infection. Your sexual activity has changed since you were last screened, and you are at increased risk for chlamydia or gonorrhea. Ask your health care provider if you are at risk. Ask your health care provider about whether you are at high risk for HIV.  Your health care provider may recommend a prescription medicine to help prevent HIV infection. If you choose to take medicine to prevent HIV, you should first get tested for HIV. You should then be tested every 3 months for as long as you are taking the medicine. Follow these instructions at home: Lifestyle Do not use any products that contain nicotine or tobacco, such as cigarettes, e-cigarettes, and chewing tobacco. If you need help quitting, ask your health care provider. Do not use street drugs. Do not share needles. Ask your health care provider for help if you need support or information about quitting drugs. Alcohol use Do not drink alcohol if your health care provider tells you not to drink. If you drink alcohol: Limit how much you have to 0-2 drinks a day. Be aware of how much alcohol is in your drink. In the [place] one drink equals one 12 oz bottle of beer (355 mL), one 5 oz glass of wine (148 mL), or one 1 oz glass of hard liquor (44 mL). General instructions Schedule regular health, dental, and eye exams. Stay current with your vaccines. Tell your health care provider if: You often feel depressed. You have ever been abused or do not feel safe at home. Summary Adopting a healthy lifestyle and getting preventive care are important in promoting health and wellness. Follow your health care provider's instructions about healthy diet, exercising, and getting tested or screened for diseases. Follow your health care provider's instructions on monitoring your cholesterol and blood pressure. This information is not intended to replace advice given to you by your health care provider. Make sure you discuss any questions you have with your health care provider. Document Revised: 06/16/2020 Document Reviewed: 04/01/2018 Elsevier Patient Education  2022 Elsevier Inc.  

## 2021-01-18 ENCOUNTER — Telehealth: Payer: Self-pay | Admitting: Family Medicine

## 2021-01-18 NOTE — Telephone Encounter (Signed)
Called dentist office back and left message to see when extraction date is and how many teeth are being extracted?

## 2021-01-18 NOTE — Telephone Encounter (Signed)
Dentist office called  back 1 tooth #30,doesn't have appt yet just wanted to know ahead of time of when he will stop taking the blood thinners

## 2021-01-18 NOTE — Telephone Encounter (Signed)
Jammie of [facility] calling stating how  long do pt have to be off his blood thinners  to do an extraction.

## 2021-01-21 NOTE — Telephone Encounter (Signed)
I would hold xarelto for 3 doses prior to the procedure and then restart the day after the procedure.    Example-if having a procedure done on Wednesday, then I would hold the dose on Monday, Tuesday, Wednesday, would have the procedure on Wednesday, and then restart on Thursday.  Thanks.

## 2021-01-22 ENCOUNTER — Telehealth: Payer: Self-pay

## 2021-01-22 NOTE — Progress Notes (Addendum)
    Chronic Care Management Pharmacy Assistant   Name: Tim Hodge  MRN: [id number] DOB: [birth year]  Reason for Encounter: Medication Adherence - Lisinopril    Recent office visits:  01/13/2021 - Beckie Salts, RMA - Patient presented for Annual Wellness Visit.   Recent consult visits:  None since CCM contact  Hospital visits:  None in previous 6 months  Medication Adherence for Lisinopril due to fill history past due. [name]; patient last had Lisinopril filled on 01/02/2021 for 30 days.   Medications: Outpatient Encounter Medications as of 01/22/2021  Medication Sig   Dulaglutide (TRULICITY) 2.23 VK/1.2AE SOPN Inject 1.5 mg into the skin once a week.   gabapentin (NEURONTIN) 300 MG capsule Take 1 capsule (300 mg total) by mouth 3 (three) times daily.   glimepiride (AMARYL) 4 MG tablet TAKE 2 TABLETS BY MOUTH ONCE DAILY WITH BREAKFAST   Krill Oil 1000 MG CAPS Take 1,000 mg by mouth.   lisinopril (ZESTRIL) 5 MG tablet Take 1 tablet (5 mg total) by mouth daily.   XARELTO 20 MG TABS tablet TAKE 1 TABLET BY MOUTH ONCE DAILY WITH SUPPER   No facility-administered encounter medications on file as of 01/22/2021.   [name], CPP notified  Marijean Niemann, [place] Clinical Pharmacy Assistant [phone]  I have reviewed the care management and care coordination activities outlined in this encounter and I am certifying that I agree with the content of this note. No further action required.  [name], PharmD Clinical Pharmacist [facility] at [facility] [phone]

## 2021-01-24 ENCOUNTER — Telehealth: Payer: Self-pay | Admitting: Family Medicine

## 2021-01-24 NOTE — Telephone Encounter (Signed)
Tim Hodge office called wants to know how long dose pt needs to be off blood thinner before an extraction . Please Advise  #[phone]

## 2021-01-24 NOTE — Telephone Encounter (Signed)
See other TE note. 

## 2021-01-24 NOTE — Telephone Encounter (Signed)
LMTCB

## 2021-01-24 NOTE — Telephone Encounter (Signed)
Spoke with Jammie at dentist office and advised patient will need to hold xarelto for 3 doses prior to the procedure and will need to restart the day after the procedure.

## 2021-02-23 ENCOUNTER — Other Ambulatory Visit: Payer: Self-pay | Admitting: Family Medicine

## 2021-02-26 ENCOUNTER — Telehealth: Payer: Self-pay

## 2021-02-26 NOTE — Telephone Encounter (Signed)
Patient called about location of his Trulicity medication for pick up. He is not sure which office. States he received a call about this but I do not see it in chart.  [name], PharmD Clinical Pharmacist [facility] at [facility] [phone]

## 2021-02-26 NOTE — Progress Notes (Signed)
    Chronic Care Management Pharmacy Assistant   Name: JAQUISE FAUX  MRN: [id number] DOB: [birth year]  Reason for Encounter: CCM (Trulicity Patient Assistance Delivery)  02/26/2021 - Spoke with Gean Birchwood; attempted delivery on 02/15/2021 to [place] - sent medication back due to being closed. Ge[facility] is sending out a new delivery today and will arrive to [facility] on Wednesday, November 9. I called patient and informed him of new delivery. Time Spent: [place], CPP notified  Marijean Niemann, [place] Pharmacy Assistant [phone]

## 2021-02-26 NOTE — Telephone Encounter (Signed)
I did not call the patient nor did I receive any medication for him. He showed up at the [place] office to pick up his trulicity this morning and I could not find any for him in any fridge. I asked the [place] staff if it is in there office but they did not see any either.

## 2021-02-28 NOTE — Telephone Encounter (Signed)
Medication has not been delivered to the office yet. I called patient and let him know and advised I will call as soon as it is delivered so he can pick it up.

## 2021-02-28 NOTE — Telephone Encounter (Signed)
Medication has been received and patient has been notified to pick up tomorrow.

## 2021-02-28 NOTE — Telephone Encounter (Signed)
Pt called asking about his medicine getting delivered... I was not sure what to tell him.

## 2021-03-27 ENCOUNTER — Telehealth: Payer: Self-pay

## 2021-03-27 NOTE — Chronic Care Management (AMB) (Addendum)
Chronic Care Management Pharmacy Assistant   Name: Tim Hodge  MRN: [id number] DOB: May 06, 1955  Reason for Encounter:  Diabetes Review  Recent office visits:  01/13/21 - Telemedicine - Patient presented for AWV. No medication changes.  Recent consult visits:  None since last CCM contact  Hospital visits:  None in previous 6 months  Medications: Outpatient Encounter Medications as of 03/27/2021  Medication Sig   Dulaglutide (TRULICITY) 3.71 GG/2.6RS SOPN Inject 1.5 mg into the skin once a week.   gabapentin (NEURONTIN) 300 MG capsule Take 1 capsule (300 mg total) by mouth 3 (three) times daily.   glimepiride (AMARYL) 4 MG tablet TAKE 2 TABLETS BY MOUTH ONCE DAILY WITH BREAKFAST   Krill Oil 1000 MG CAPS Take 1,000 mg by mouth.   lisinopril (ZESTRIL) 5 MG tablet TAKE 1 TABLET BY MOUTH ONCE A DAY   XARELTO 20 MG TABS tablet TAKE 1 TABLET BY MOUTH ONCE DAILY WITH SUPPER   No facility-administered encounter medications on file as of 03/27/2021.     Recent Relevant Labs: Lab Results  Component Value Date/Time   HGBA1C 7.9 (H) 09/25/2020 08:44 AM   HGBA1C 10.2 (A) 06/20/2020 10:37 AM   HGBA1C 8.7 (A) 03/13/2020 08:48 AM   HGBA1C 8.3 (H) 11/02/2019 07:44 AM    Kidney Function Lab Results  Component Value Date/Time   CREATININE 0.91 09/25/2020 08:44 AM   CREATININE 0.94 11/02/2019 07:44 AM   GFR 89.18 09/25/2020 08:44 AM   GFRNONAA >60 07/09/2016 03:43 PM   GFRAA >60 07/09/2016 03:43 PM   Contacted patient on 03/30/21 to discuss diabetes disease state.   Current antihyperglycemic regimen:  Glimepiride 4 mg - 2 tablets (8 mg) daily with breakfast Trulicity 1.5 mg - Inject once weekly Sundays     Patient verbally confirms he is taking the above medications as directed. Yes  What diet changes have been made to improve diabetes control? The patient reports smaller portions of food.  What recent interventions/DTPs have been made to improve glycemic control: None  identified  Have there been any recent hospitalizations or ED visits since last visit with CPP? No  Patient denies hypoglycemic symptoms, including Pale, Sweaty, Shaky, Hungry, Nervous/irritable, and Vision changes  Patient denies hyperglycemic symptoms, including blurry vision, excessive thirst, fatigue, polyuria, and weakness  How often are you checking your blood sugar? once daily  What are your blood sugars ranging?  Fasting:  Ranges from 150- 160 per patient   During the week, how often does your blood glucose drop below 70? Never The patient did report a BG of 79 during the past month. He acknowledged understanding of protocol for low BG with getting some juice, hard candy, sit down, Reviewed the 15-15 rule for BG < 70.  Are you checking your feet daily/regularly? Yes  Adherence Review: Is the patient currently on a STATIN medication? No Is the patient currently on ACE/ARB medication? Yes Does the patient have >5 day gap between last estimated fill dates? No  Care Gaps: Annual wellness visit in last year? Yes Most recent A1C reading:  7.9  09/25/20 Most Recent BP reading:  112/62  72-P  09/25/20  Last eye exam / retinopathy screening: up to date per chart Last diabetic foot exam: 06/20/2020  Counseled patient on importance of annual eye and foot exam.  Up to date   Star Rating Drugs:  Medication:  Last Fill: Day Supply Glimepiride 4mg  03/03/21 30 Lisinopril 5mg   85/4/62 90  Trulicity 1.5mg   gets from [place] provided last fill dates    No appointments scheduled within the next 30 days.  [name], CPP notified  [name], [place] Assistant [phone]  I have reviewed the care management and care coordination activities outlined in this encounter and I am certifying that I agree with the content of this note. No further action required.  [name], PharmD Clinical Pharmacist [facility] at  [facility] [phone]

## 2021-04-05 ENCOUNTER — Other Ambulatory Visit: Payer: Self-pay | Admitting: Family Medicine

## 2021-04-09 ENCOUNTER — Telehealth: Payer: Self-pay | Admitting: Family Medicine

## 2021-04-09 MED ORDER — GLIMEPIRIDE 4 MG PO TABS: ORAL_TABLET | ORAL | 2 refills | Status: DC

## 2021-04-09 NOTE — Addendum Note (Signed)
Addended by: Sherrilee Gilles B on: 04/09/2021 03:55 PM   Modules accepted: Orders

## 2021-04-09 NOTE — Telephone Encounter (Signed)
1.Medication Requested: glimepiride (AMARYL) 4 MG tablet 2. Pharmacy (Name, Street, [place]): [place], [place] 3. On Med List: yes 4. Last Visit with PCP: 6.6.22  5. Next visit date with PCP: 1.9.23   Agent: Please be advised that RX refills may take up to 3 business days. We ask that you follow-up with your pharmacy.

## 2021-04-09 NOTE — Telephone Encounter (Signed)
Refill request for Gabapentin 300 mg caps  LOV - 09/25/20 Next OV - 04/30/21 Last refill - 03/13/20 #90/12

## 2021-04-10 NOTE — Telephone Encounter (Signed)
Sent. Thanks.   

## 2021-04-20 ENCOUNTER — Telehealth: Payer: Self-pay | Admitting: Family Medicine

## 2021-04-20 NOTE — Telephone Encounter (Signed)
Type of form received Prescription request form  Form placed in E-fax folder  Crystal with [employer] is calling to follow-up on this form. Need completed in order to ship testing supplies to the patient  Form is in the e-fax folder

## 2021-04-20 NOTE — Telephone Encounter (Signed)
There is only one piece to the form; the rest was not sent over. Need completed forms before these can be filled out.

## 2021-04-25 DIAGNOSIS — E119 Type 2 diabetes mellitus without complications: Secondary | ICD-10-CM | POA: Diagnosis not present

## 2021-04-25 DIAGNOSIS — H524 Presbyopia: Secondary | ICD-10-CM | POA: Diagnosis not present

## 2021-04-25 DIAGNOSIS — H25013 Cortical age-related cataract, bilateral: Secondary | ICD-10-CM | POA: Diagnosis not present

## 2021-04-25 DIAGNOSIS — H2513 Age-related nuclear cataract, bilateral: Secondary | ICD-10-CM | POA: Diagnosis not present

## 2021-04-25 DIAGNOSIS — H5203 Hypermetropia, bilateral: Secondary | ICD-10-CM | POA: Diagnosis not present

## 2021-04-25 DIAGNOSIS — H52223 Regular astigmatism, bilateral: Secondary | ICD-10-CM | POA: Diagnosis not present

## 2021-04-25 LAB — HM DIABETES EYE EXAM

## 2021-04-27 ENCOUNTER — Other Ambulatory Visit: Payer: Self-pay | Admitting: Family Medicine

## 2021-04-30 ENCOUNTER — Ambulatory Visit: Payer: Medicare Other | Admitting: Family Medicine

## 2021-05-08 ENCOUNTER — Other Ambulatory Visit: Payer: Self-pay

## 2021-05-08 ENCOUNTER — Encounter: Payer: Self-pay | Admitting: Family Medicine

## 2021-05-08 ENCOUNTER — Ambulatory Visit: Payer: Medicare PPO | Admitting: Family Medicine

## 2021-05-08 VITALS — BP 124/68 | HR 88 | Temp 98.2°F | Ht 73.0 in | Wt 321.0 lb

## 2021-05-08 DIAGNOSIS — E119 Type 2 diabetes mellitus without complications: Secondary | ICD-10-CM | POA: Diagnosis not present

## 2021-05-08 DIAGNOSIS — M25569 Pain in unspecified knee: Secondary | ICD-10-CM

## 2021-05-08 LAB — POCT GLYCOSYLATED HEMOGLOBIN (HGB A1C): Hemoglobin A1C: 8.2 % — AB (ref 4.0–5.6)

## 2021-05-08 MED ORDER — TRULICITY 0.75 MG/0.5ML ~~LOC~~ SOAJ: 3.0000 mg | SUBCUTANEOUS | Status: DC

## 2021-05-08 NOTE — Patient Instructions (Addendum)
Ask the front about a record release about your eye exam from [place].  Take care.  Glad to see you.  Plan on recheck in about 3 months.  Labs at the visit.    Try taking 3mg  (2 of the trulicity shots) on Sundays.  If you low sugars, then cut the glimepiride back to 1 tab a day.    Let me know how that goes and we'll see about refills on your medication.   Let me know if you want to go see orthopedics about your knees.

## 2021-05-08 NOTE — Progress Notes (Signed)
This visit occurred during the SARS-CoV-2 public health emergency.  Safety protocols were in place, including screening questions prior to the visit, additional usage of staff PPE, and extensive cleaning of exam room while observing appropriate contact time as indicated for disinfecting solutions.  Diabetes:  Using medications without difficulties: yes Hypoglycemic episodes:no Hyperglycemic episodes:no Feet problems: at baseline with episodic tingling.   Blood Sugars averaging: ~200 recently with 1.5mg  weekly dosing of trulicity.  He has 4 doses of trulicity left at home.   eye exam within last year: yes, done at [facility] clinic.   A1c d/w pt at OV.    See after visit summary.  He is putting up with medial B knee pain.  He was hoping weight reduction would help.    Meds, vitals, and allergies reviewed.   ROS: Per HPI unless specifically indicated in ROS section   GEN: nad, alert and oriented HEENT: ncat NECK: supple w/o LA CV: rrr. PULM: ctab, no inc wob ABD: soft, +bs EXT: trace BLE edema SKIN: no acute rash

## 2021-05-09 ENCOUNTER — Telehealth: Payer: Self-pay | Admitting: Family Medicine

## 2021-05-09 DIAGNOSIS — M25569 Pain in unspecified knee: Secondary | ICD-10-CM | POA: Insufficient documentation

## 2021-05-09 DIAGNOSIS — E119 Type 2 diabetes mellitus without complications: Secondary | ICD-10-CM

## 2021-05-09 NOTE — Assessment & Plan Note (Signed)
Discussed with patient about options.  Would increase Trulicity I asked him to try taking 3mg  (2 of the trulicity shots) on Sundays.  If low sugars, then cut the glimepiride back to 1 tab a day.    I asked him to let me know how that goes and we'll see about refills on Trulicity at that point. Plan on recheck in about 3 months.  Labs at the visit.

## 2021-05-09 NOTE — Assessment & Plan Note (Signed)
He will let me know when he wants to see orthopedics.  See above.  He is going to work on weight reduction.

## 2021-05-09 NOTE — Telephone Encounter (Signed)
Please see my office visit note.  I asked him to increase his Trulicity dose.  If he tolerates that then we will need to change his prescription.  I need your help with this.  Thank you.

## 2021-05-10 MED ORDER — TRULICITY 3 MG/0.5ML ~~LOC~~ SOAJ: 3.0000 mg | SUBCUTANEOUS | 3 refills | Status: DC

## 2021-05-10 NOTE — Telephone Encounter (Deleted)
Please call patient in 1 week and see how is tolerating the higher dose Trulicity (3 mg - taking two of the 1.5 mg doses weekly). If tolerated, we will need to call Tim Hodge to update his prescription from 1.5 mg to 3 mg weekly.

## 2021-05-10 NOTE — Addendum Note (Signed)
Addended by: [name] on: 05/10/2021 01:45 PM   Modules accepted: Orders

## 2021-05-10 NOTE — Telephone Encounter (Addendum)
Spoke with [employer] representative with [employer]. They report a 2 month supply of Trulicity 1.5 mg was delivered on 05/01/21 to [place]. They would not allow me to change his dose because his application has expired. He needs to renew for 2023. We will fill out a new application for Trulicity 3 mg.

## 2021-05-11 NOTE — Progress Notes (Signed)
Contacted patient to verify how many Trulicity 1.5 mg pens he has. Patient states he has 1 box of 3 pens remaining at home and he picked up 2 boxes from the doctors office. I advised patient that the patient assistance forms for Trulicity 3.0 mg have been completed. Patient would like those mailed to his home address. Once completed patient will mail them back.   Patient Assistance form for Trulicity 3 mg for 0881 has been completed and uploaded.   [name], CPP notified  Marijean Niemann, [place] Clinical Pharmacy Assistant [phone]  Time Spent:  67 Minutes

## 2021-05-17 NOTE — Progress Notes (Signed)
PAP forms for Trulicity printed and mailed out to the patient with instructions for renewal.  [name], CPP notified  [name], [place] Assistant [phone]  Total time spent for month CPA: 10 min.

## 2021-05-17 NOTE — Telephone Encounter (Addendum)
Forms reviewed and Shanon Brow will mail today. Please let patient know the fastest option would be to come to [place] office to sign or email forms. Is he able to do either? I don't want him to run out of Trulicity in the meantime since his dose was doubled.  [name], PharmD Clinical Pharmacist Practitioner [facility] at [facility] [phone]

## 2021-05-17 NOTE — Progress Notes (Signed)
Called patient to verify if he still wanted forms mailed or to come in the office because of dose increase. No answer; left message.  [name], CPP notified  Marijean Niemann, [place] Clinical Pharmacy Assistant [phone]  Time Spent: 3 Minutes

## 2021-05-31 ENCOUNTER — Telehealth: Payer: Self-pay

## 2021-05-31 NOTE — Chronic Care Management (AMB) (Signed)
° ° °  Chronic Care Management Pharmacy Assistant   Name: Tim Hodge  MRN: [id number] DOB: 25-Jun-1955   Reason for Encounter: Reminder Call   Conditions to be addressed/monitored: HTN and DMII  Medications: Outpatient Encounter Medications as of 05/31/2021  Medication Sig   Dulaglutide (TRULICITY) 3 ID/0.3UD SOPN Inject 3 mg as directed once a week.   gabapentin (NEURONTIN) 300 MG capsule TAKE 1 CAPSULE BY MOUTH 3 TIMES A DAY   glimepiride (AMARYL) 4 MG tablet TAKE 2 TABLETS BY MOUTH ONCE DAILY WITH BREAKFAST   Krill Oil 1000 MG CAPS Take 1,000 mg by mouth.   lisinopril (ZESTRIL) 5 MG tablet TAKE 1 TABLET BY MOUTH ONCE A DAY   XARELTO 20 MG TABS tablet TAKE 1 TABLET BY MOUTH ONCE DAILY WITH SUPPER   No facility-administered encounter medications on file as of 05/31/2021.    JEF FUTCH was contacted to remind of upcoming telephone visit with [name] on 06/05/21 at 3:30pm Patient was reminded to have all medications, supplements and any blood glucose and blood pressure readings available for review at appointment. If unable to reach, a voicemail was left for patient.    Are you having any problems with your medications? No  The patient is getting Trulicity from manufacturer  Do you have any concerns you like to discuss with the pharmacist? No    Star Rating Drugs: Medication:  Last Fill: Day Supply Trulicity 3mg   manufacturer Glimepiride 4mg  05/02/21 30 Lisinopril 5mg   01/02/21 [place], CPP notified  [name], [place] Assistant [phone]  Total time spent for month CPA: 10 min

## 2021-06-01 ENCOUNTER — Telehealth: Payer: Self-pay

## 2021-06-01 NOTE — Progress Notes (Signed)
° ° °  Chronic Care Management Pharmacy Assistant   Name: Tim Hodge  MRN: [id number] DOB: 12-31-1955  Reason for Encounter: CCM (Trulicity 6789 Patient Assistance)  [place] to check on the status of patient's Trulicity application for 3810. [employer] does not have any renewal paperwork on file for patient.   Tim Hodge, CPP notified  Tim Hodge, [place] Clinical Pharmacy Assistant [phone]  Time Spent: 10 Minutes

## 2021-06-05 ENCOUNTER — Other Ambulatory Visit: Payer: Self-pay

## 2021-06-05 ENCOUNTER — Telehealth: Payer: Medicare PPO

## 2021-06-05 ENCOUNTER — Ambulatory Visit (INDEPENDENT_AMBULATORY_CARE_PROVIDER_SITE_OTHER): Payer: Medicare Other

## 2021-06-05 DIAGNOSIS — E119 Type 2 diabetes mellitus without complications: Secondary | ICD-10-CM

## 2021-06-05 DIAGNOSIS — I10 Essential (primary) hypertension: Secondary | ICD-10-CM

## 2021-06-05 NOTE — Telephone Encounter (Signed)
Pt would like to come to [facility] to sign forms this week. Trulicity 3 mg forms emailed to [employer].

## 2021-06-05 NOTE — Patient Instructions (Signed)
Dear Tim Hodge,  Below is a summary of the goals we discussed during our follow up appointment on June 05, 2021. Please contact me anytime with questions or concerns.   Visit Information  Patient Care Plan: CCM Pharmacy Care Plan     Problem Identified: CHL AMB "PATIENT-SPECIFIC PROBLEM"      Long-Range Goal: Disease Management   Start Date: 08/31/2020  This Visit's Progress: On track  Priority: High  Note:   Current Barriers:  None identified   Pharmacist Clinical Goal(s):  Patient will contact provider office for questions/concerns as evidenced notation of same in electronic health record through collaboration with PharmD and provider.   Interventions: 1:1 collaboration with Tonia Ghent, MD regarding development and update of comprehensive plan of care as evidenced by provider attestation and co-signature Inter-disciplinary care team collaboration (see longitudinal plan of care) Comprehensive medication review performed; medication list updated in electronic medical record  Hypertension (BP goal <140/90) -Controlled - clinic readings within goal  -Affirms adherence  -Current treatment: Lisinopril 5 mg - 1 tablet daily - Appropriate, Effective, Safe, Accessible -Medications previously tried: none  -Current home readings: does not have home monitor  -Recommended to continue current medication  Hyperlipidemia: (LDL goal < 70) -Not ideally controlled - LDL 98, discussed goals and possible improvement with improved BG control. Discussed option of Zetia if LDL remains elevated. Recommend updating lipid panel. -Current treatment: None  -Medications previously tried: statins - pt describes very significant muscle cramps -Recommended to continue current medication  Diabetes (A1c goal <7%) -Controlled - Home readings have improved on Trulicity 3 mg. He reduced glimepiride to 4 mg daily. He needs to renew Trulicity PAP for 6644. He only has 2 weeks remaining. He  mailed forms to [place] but we are no longer at that clinic. Will reprint forms and pt will come in to sign this week. 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 Active MyChart status confirmed with patient.    [name], PharmD Clinical Pharmacist Practitioner [facility] at [facility] [phone]

## 2021-06-05 NOTE — Progress Notes (Signed)
Chronic Care Management Pharmacy Note  06/05/2021 Name:  Tim Hodge MRN:  [id number] DOB:  1955/09/14  Summary: CCM follow up visit. Diabetes, much improved with fasting BG 105-125. Random glucose throughout day ~726 on Trulicity 3 mg weekly, glimepiride 4 mg once daily. We need to submit Trulicity PAP for 2035. He will come in this week to sign. HTN, controlled on lisinopril 5 mg daily. Discussed HLD and potential to try Zetia if needed. Would like to re-eval lipids now that DM control is improving.  Recommendations: Update lipid panel with next labs Consider Zetia for LDL lowering due to CV risk and statin intolerance  Plan: - CCM PharmD follow up 6 months  - CCM Health Concierge diabetes review 3 months  Subjective: Tim Hodge is an 66 y.o. year old male who is a primary patient of Damita Dunnings, Elveria Rising, MD.  The CCM team was consulted for assistance with disease management and care coordination needs.    Engaged with patient by telephone for follow up visit in response to provider referral for pharmacy case management and/or care coordination services.   Consent to Services:  The patient was given information about Chronic Care Management services, agreed to services, and gave verbal consent prior to initiation of services.  Please see initial visit note for detailed documentation.   Patient Care Team: Tonia Ghent, MD as PCP - General (Family Medicine) Phylliss Bob, MD as Consulting Physician (Orthopedic Surgery) [name], [facility] as Pharmacist (Pharmacist) Thelma Comp, [place] (Optometry)  Recent office visits:  05/08/21 - Patsi Sears, PCP - Pt presented for diabetes follow up. Try taking 3 mg (2 of the trulicity shots) on Sundays.  If you low sugars, then cut the glimepiride back to 1 tab a day.    Recent consult visits:  None in previous 6 months    Hospital visits: None in previous 6 months  Objective:  Allergies  Allergen Reactions   Iodinated  Contrast Media Shortness Of Breath and Rash   Flexeril [Cyclobenzaprine]     Intolerant- sedation.     Iodine    Tramadol Other (See Comments)    Ineffective for pain.    Vioxx [Rofecoxib] Other (See Comments)    LFT elevation   Metformin And Related Other (See Comments)    Muscle cramps   Valium [Diazepam] Rash    Rash     Lab Results  Component Value Date   CREATININE 0.91 09/25/2020   BUN 14 09/25/2020   GFR 89.18 09/25/2020   GFRNONAA >60 07/09/2016   GFRAA >60 07/09/2016   NA 136 09/25/2020   K 4.4 09/25/2020   CALCIUM 8.7 09/25/2020   CO2 24 09/25/2020   GLUCOSE 396 (H) 09/25/2020    Lab Results  Component Value Date/Time   HGBA1C 8.2 (A) 05/08/2021 11:00 AM   HGBA1C 7.9 (H) 09/25/2020 08:44 AM   HGBA1C 10.2 (A) 06/20/2020 10:37 AM   HGBA1C 8.3 (H) 11/02/2019 07:44 AM   GFR 89.18 09/25/2020 08:44 AM   GFR 80.94 11/02/2019 07:44 AM    Last diabetic Eye exam:  Lab Results  Component Value Date/Time   HMDIABEYEEXA No Retinopathy 04/25/2021 12:00 AM    Last diabetic Foot exam:  05/08/21 Normal foot exam   Lab Results  Component Value Date   CHOL 167 09/25/2020   HDL 45.90 09/25/2020   LDLCALC 98 09/25/2020   LDLDIRECT 117.8 02/07/2014   TRIG 116.0 09/25/2020   CHOLHDL 4 09/25/2020    Hepatic Function Latest  Ref Rng & Units 09/25/2020 11/02/2019 11/02/2018  Total Protein 6.0 - 8.3 g/dL 5.8(L) 6.2 6.0  Albumin 3.5 - 5.2 g/dL 3.2(L) 3.5 3.6  AST 0 - 37 U/L 42(H) 39(H) 40(H)  ALT 0 - 53 U/L 33 34 46  Alk Phosphatase 39 - 117 U/L 172(H) 147(H) 151(H)  Total Bilirubin 0.2 - 1.2 mg/dL 1.1 1.4(H) 1.1    Lab Results  Component Value Date/Time   TSH 1.09 09/25/2020 08:44 AM    CBC Latest Ref Rng & Units 09/25/2020 11/02/2019 05/10/2019  WBC 4.0 - 10.5 K/uL 3.5(L) 4.9 4.2  Hemoglobin 13.0 - 17.0 g/dL 13.5 13.9 14.3  Hematocrit 39.0 - 52.0 % 39.5 40.8 42.7  Platelets 150.0 - 400.0 K/uL 115.0(L) 115.0(L) 143.0(L)    No results found for: VD25OH  Clinical  ASCVD: No  The 10-year ASCVD risk score (Arnett DK, et al., 2019) is: 23.6%   Values used to calculate the score:     Age: 66 years     Sex: Male     Is Non-Hispanic African American: No     Diabetic: Yes     Tobacco smoker: No     Systolic Blood Pressure: 174 mmHg     Is BP treated: Yes     HDL Cholesterol: 45.9 mg/dL     Total Cholesterol: 167 mg/dL    Depression screen [facility] 2/9 01/13/2021 01/13/2021 03/13/2020  Decreased Interest 0 0 0  Down, Depressed, Hopeless 0 0 0  PHQ - 2 Score 0 0 0    Social History   Tobacco Use  Smoking Status Former   Types: Cigarettes  Smokeless Tobacco Never   BP Readings from Last 3 Encounters:  05/08/21 124/68  09/25/20 112/62  06/20/20 122/68   Pulse Readings from Last 3 Encounters:  05/08/21 88  09/25/20 72  06/20/20 72   Wt Readings from Last 3 Encounters:  05/08/21 (!) 321 lb (145.6 kg)  09/25/20 (!) 318 lb (144.2 kg)  06/20/20 (!) 319 lb (144.7 kg)   BMI Readings from Last 3 Encounters:  05/08/21 42.35 kg/m  09/25/20 41.96 kg/m  06/20/20 42.09 kg/m    Assessment/Interventions: Review of patient past medical history, allergies, medications, health status, including review of consultants reports, laboratory and other test data, was performed as part of comprehensive evaluation and provision of chronic care management services.   SDOH:  (Social Determinants of Health) assessments and interventions performed: Yes SDOH Interventions    Flowsheet Row Most Recent Value  SDOH Interventions   Financial Strain Interventions Intervention Not Indicated       SDOH Screenings   Alcohol Screen: Low Risk    Last Alcohol Screening Score (AUDIT): 2  Depression (PHQ2-9): Low Risk    PHQ-2 Score: 0  Financial Resource Strain: Low Risk    Difficulty of Paying Living Expenses: Not very hard  Food Insecurity: No Food Insecurity   Worried About Charity fundraiser in the Last Year: Never true   Ran Out of Food in the Last Year: Never  true  Housing: Low Risk    Last Housing Risk Score: 0  Physical Activity: Sufficiently Active   Days of Exercise per Week: 7 days   Minutes of Exercise per Session: 60 min  Social Connections: Moderately Isolated   Frequency of Communication with Friends and Family: Three times a week   Frequency of Social Gatherings with Friends and Family: More than three times a week   Attends Religious Services: Never   Active Member  of Clubs or Organizations: No   Attends Archivist Meetings: Never   Marital Status: Married  Stress: No Stress Concern Present   Feeling of Stress : Not at all  Tobacco Use: Medium Risk   Smoking Tobacco Use: Former   Smokeless Tobacco Use: Never   Passive Exposure: Not on file  Transportation Needs: No Transportation Needs   Lack of Transportation (Medical): No   Lack of Transportation (Non-Medical): No    CCM Care Plan  Allergies  Allergen Reactions   Iodinated Contrast Media Shortness Of Breath and Rash   Flexeril [Cyclobenzaprine]     Intolerant- sedation.     Iodine    Tramadol Other (See Comments)    Ineffective for pain.    Vioxx [Rofecoxib] Other (See Comments)    LFT elevation   Metformin And Related Other (See Comments)    Muscle cramps   Valium [Diazepam] Rash    Rash    Medications Reviewed Today     Reviewed by [name], [facility] (Pharmacist) on 06/05/21 at 1548  Med List Status: <None>   Medication Order Taking? Sig Documenting Provider Last Dose Status Informant  Dulaglutide (TRULICITY) 3 ZJ/6.7HA SOPN [id number] Yes Inject 3 mg as directed once a week. Tonia Ghent, MD Taking Active   gabapentin (NEURONTIN) 300 MG capsule [id number] Yes TAKE 1 CAPSULE BY MOUTH 3 TIMES A DAY Tonia Ghent, MD Taking Active   glimepiride (AMARYL) 4 MG tablet [id number] Yes TAKE 2 TABLETS BY MOUTH ONCE DAILY WITH BREAKFAST  Patient taking differently: 4 mg daily with breakfast. TAKE 2 TABLETS BY MOUTH ONCE DAILY WITH BREAKFAST   Tonia Ghent, MD Taking Active Self  Krill Oil 1000 MG CAPS [id number] Yes Take 1,000 mg by mouth. [provider] Taking Active   lisinopril (ZESTRIL) 5 MG tablet [id number] Yes TAKE 1 TABLET BY MOUTH ONCE A DAY Tonia Ghent, MD Taking Active   XARELTO 20 MG TABS tablet [id number] Yes TAKE 1 TABLET BY MOUTH ONCE DAILY WITH SUPPER Tonia Ghent, MD Taking Active             Patient Active Problem List   Diagnosis Date Noted   Knee pain 05/09/2021   Thrombocytopenia ([place]) 11/10/2019   Paresthesia 02/07/2019   Hx of adenomatous colonic polyps 12/07/2018   Fatty liver 11/19/2018   LFT elevation 11/08/2018   Encounter for general adult medical examination with abnormal findings 04/03/2016   Advance care planning 04/03/2016   Trigger finger, acquired 04/03/2016   Severe obesity (BMI >= 40) ([place]) 02/09/2014   Morbid obesity ([place]) 02/09/2014   Colon polyps 01/07/2014   History of DVT of lower extremity 01/07/2014   HTN (hypertension) 12/01/2013   Diabetes mellitus without complication ([place]) 14/97/0263   Chronic back pain 12/01/2013   History of pulmonary embolism 12/01/2013   Abnormal EKG 03/01/2013   PVC's (premature ventricular contractions) 03/01/2013    Immunization History  Administered Date(s) Administered   Influenza,inj,Quad PF,6+ Mos 03/06/2015, 02/21/2017, 01/16/2018, 02/05/2019, 03/13/2020   Influenza-Unspecified 02/03/2014   Moderna Sars-Covid-2 Vaccination 07/21/2019, 08/18/2019, 04/04/2020   Pneumococcal Polysaccharide-23 04/22/2012   Tdap 04/22/2009, 10/07/2014    Conditions to be addressed/monitored:  Hypertension, Hyperlipidemia and Diabetes  Patient Care Plan: CCM Pharmacy Care Plan     Problem Identified: CHL AMB "PATIENT-SPECIFIC PROBLEM"      Long-Range Goal: Disease Management   Start Date: 08/31/2020  This Visit's Progress: On track  Priority: High  Note:   Current Barriers:  None identified   Pharmacist Clinical Goal(s):  Patient  will contact provider office for questions/concerns as evidenced notation of same in electronic health record through collaboration with PharmD and provider.   Interventions: 1:1 collaboration with Tonia Ghent, MD regarding development and update of comprehensive plan of care as evidenced by provider attestation and co-signature Inter-disciplinary care team collaboration (see longitudinal plan of care) Comprehensive medication review performed; medication list updated in electronic medical record  Hypertension (BP goal <140/90) -Controlled - clinic readings within goal  -Affirms adherence  -Current treatment: Lisinopril 5 mg - 1 tablet daily - Appropriate, Effective, Safe, Accessible -Medications previously tried: none  -Current home readings: does not have home monitor  -Recommended to continue current medication  Hyperlipidemia: (LDL goal < 70) -Not ideally controlled - LDL 98, discussed goals and possible improvement with improved BG control. Discussed option of Zetia if LDL remains elevated. Recommend updating lipid panel. -Current treatment: None  -Medications previously tried: statins - pt describes very significant muscle cramps -Recommended to continue current medication  Diabetes (A1c goal <7%) -Controlled - Home readings have improved on Trulicity 3 mg. He reduced glimepiride to 4 mg daily. He needs to renew Trulicity PAP for 8307. He only has 2 weeks remaining. He mailed forms to [facility] but we are no longer at that clinic. Will reprint forms and pt will come in to sign this week. 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 Patient will come in this week to sign off on forms.  Star Rating Drugs: Medication:                Last Fill:         Day Supply Trulicity 3mg                manufacturer assistance Glimepiride 4mg          05/02/21            30 Lisinopril 5mg              01/02/21            30'  Past due - Pt confirms adherence   Patient's preferred pharmacy is:  [facility], [place] [address] Phone: [phone] Fax: [phone]  Uses pill box? Yes Pt endorses 100% compliance  Patient is very happy with current pharmacy services.  Care Plan and Follow Up Patient Decision:  Patient agrees to Care Plan and Follow-up.  [name], PharmD Clinical Pharmacist [facility] at [facility] [phone]

## 2021-06-06 NOTE — Telephone Encounter (Signed)
Printed [employer] forms. Placed in front office "patient documents" file for patient to come sign and leave proof of income.  Please return forms to me once completed by patient.

## 2021-06-18 NOTE — Progress Notes (Signed)
Called [employer] to check on the status of patient's Trulicity application for 5797. [employer] does not have any renewal paperwork on file for patient. Please refax to [place].  [name], CPP notified  Marijean Niemann, [place] Clinical Pharmacy Assistant [phone]  Time Spent: 10 Minutes

## 2021-06-19 ENCOUNTER — Telehealth: Payer: Self-pay | Admitting: Pharmacist

## 2021-06-19 DIAGNOSIS — I10 Essential (primary) hypertension: Secondary | ICD-10-CM | POA: Diagnosis not present

## 2021-06-19 DIAGNOSIS — E119 Type 2 diabetes mellitus without complications: Secondary | ICD-10-CM | POA: Diagnosis not present

## 2021-06-19 NOTE — Telephone Encounter (Signed)
Received Trulicity [employer]) pt assistance forms from patient. Placed in PCP folder for signature.

## 2021-06-19 NOTE — Telephone Encounter (Signed)
Forms placed in Dr. Duncan's inbox 

## 2021-06-19 NOTE — Telephone Encounter (Signed)
I'll work on the hard copy.  Thanks.  

## 2021-06-19 NOTE — Progress Notes (Signed)
Spoke with patient; he will bring by proof of income to the office.  [name], CPP notified  Marijean Niemann, [place] Clinical Pharmacy Assistant [phone]  Time Spent: 5 Minutes

## 2021-06-19 NOTE — Telephone Encounter (Signed)
Received forms from patient, awaiting PCP signature. Application is missing proof of income, please contact patient for documents.

## 2021-06-25 NOTE — Telephone Encounter (Signed)
Pt is out of Trulicity. He is asking the status of the Trulicirty. He is asking what he needs to do while he waits on the Trulicty. Please advise at [phone] or [phone]. ?

## 2021-06-26 DIAGNOSIS — E119 Type 2 diabetes mellitus without complications: Secondary | ICD-10-CM | POA: Diagnosis not present

## 2021-06-26 NOTE — Progress Notes (Addendum)
Called [employer] to follow up on patient's assistance forms for Trulicity for 0071. [name] faxed the forms to [place] on 06/21/2021 and they did not receive them. [employer] seems to be having fax issues. The representative recommenced using the web portal to submit the application online.  ? ?[name], CPP notified ? ?Marijean Niemann, RMA ?Clinical Pharmacy Assistant ?[phone] ? ? ? ?

## 2021-06-28 NOTE — Telephone Encounter (Signed)
PT called in stated he all out of Trulicity . Requesting a call back to know the status . Please Advise #[phone]  ?

## 2021-06-28 NOTE — Telephone Encounter (Signed)
I have now faxed the application 3x to [facility], it seems they are not receiving our faxes. Attempted to submit online application through [employer], it says it will send an email to the patient for E-signature (I had input his wife's email from his chart). I also uploaded a scan of the application he already filled out in "documents" portion of the portal so hopefully they can use that as the completed application. ? ?Amy - Please let patient know. Unfortunately there is nothing more I can do to get Trulicity to him any faster. Ask his wife to check her [employer] and see if they received an email from [employer]. ?

## 2021-06-29 NOTE — Progress Notes (Signed)
Spoke with [employer]; they received all patient assistance documents uploaded through the portal. I informed the representative that this process has taken some time and our patient has now run out of Trulicity. This case has now been escalated to priority. The representative could not give me a time frame of how long we can expect that to take. I called patient to inform him. Patient asked if he should resume taking his Glimepiride 4 mg - 2 tablets at breakfast again since he is out of Trulicity. Per the advice of [name] I told patient to take 1 tablet at breakfast as he shouldn't be out of Trulicity for much longer. I also asked patient to monitor his blood glucose daily. Patient voiced understanding. ? ?[name], CPP notified ?Marijean Niemann, RMA ?Clinical Pharmacy Assistant ?[phone] ? ? ?

## 2021-07-02 NOTE — Progress Notes (Signed)
I have added this to my schedule on 07/06/2021 to check on the status of the Trulicity delivery.  ? ?[name], CPP notified ? ?Marijean Niemann, RMA ?Clinical Pharmacy Assistant ?[phone] ? ? ?

## 2021-07-03 NOTE — Progress Notes (Signed)
While on the phone with [employer] I inquired about patient's Trulicity. Tim Hodge is still processing the patient's paperwork. I will call to check on status again on 07/06/2021. Patient does not need to e-sign any documents as he signed the paper forms that were uploaded to the portal. Dr. Damita Dunnings does not need to e-sign any documents as well.  ? ?[name], CPP notified ? ?Marijean Niemann, RMA ?Clinical Pharmacy Assistant ?[phone] ? ? ?

## 2021-07-05 ENCOUNTER — Emergency Department: Payer: Medicare Other

## 2021-07-05 ENCOUNTER — Other Ambulatory Visit: Payer: Self-pay

## 2021-07-05 ENCOUNTER — Emergency Department
Admission: EM | Admit: 2021-07-05 | Discharge: 2021-07-05 | Disposition: A | Discharge status: Home / Self Care | Payer: Medicare Other | Attending: Emergency Medicine | Admitting: Emergency Medicine

## 2021-07-05 DIAGNOSIS — M5136 Other intervertebral disc degeneration, lumbar region: Secondary | ICD-10-CM | POA: Diagnosis not present

## 2021-07-05 DIAGNOSIS — M1712 Unilateral primary osteoarthritis, left knee: Secondary | ICD-10-CM | POA: Diagnosis not present

## 2021-07-05 DIAGNOSIS — S01511A Laceration without foreign body of lip, initial encounter: Secondary | ICD-10-CM | POA: Diagnosis not present

## 2021-07-05 DIAGNOSIS — M25461 Effusion, right knee: Secondary | ICD-10-CM | POA: Diagnosis not present

## 2021-07-05 DIAGNOSIS — R109 Unspecified abdominal pain: Secondary | ICD-10-CM | POA: Insufficient documentation

## 2021-07-05 DIAGNOSIS — E119 Type 2 diabetes mellitus without complications: Secondary | ICD-10-CM | POA: Insufficient documentation

## 2021-07-05 DIAGNOSIS — S30811A Abrasion of abdominal wall, initial encounter: Secondary | ICD-10-CM | POA: Diagnosis not present

## 2021-07-05 DIAGNOSIS — Z743 Need for continuous supervision: Secondary | ICD-10-CM | POA: Diagnosis not present

## 2021-07-05 DIAGNOSIS — S01111A Laceration without foreign body of right eyelid and periocular area, initial encounter: Secondary | ICD-10-CM | POA: Insufficient documentation

## 2021-07-05 DIAGNOSIS — M76892 Other specified enthesopathies of left lower limb, excluding foot: Secondary | ICD-10-CM | POA: Diagnosis not present

## 2021-07-05 DIAGNOSIS — R739 Hyperglycemia, unspecified: Secondary | ICD-10-CM | POA: Diagnosis not present

## 2021-07-05 DIAGNOSIS — S022XXA Fracture of nasal bones, initial encounter for closed fracture: Secondary | ICD-10-CM | POA: Insufficient documentation

## 2021-07-05 DIAGNOSIS — S199XXA Unspecified injury of neck, initial encounter: Secondary | ICD-10-CM | POA: Diagnosis not present

## 2021-07-05 DIAGNOSIS — S29001A Unspecified injury of muscle and tendon of front wall of thorax, initial encounter: Secondary | ICD-10-CM | POA: Diagnosis not present

## 2021-07-05 DIAGNOSIS — I10 Essential (primary) hypertension: Secondary | ICD-10-CM | POA: Diagnosis not present

## 2021-07-05 DIAGNOSIS — Z041 Encounter for examination and observation following transport accident: Secondary | ICD-10-CM | POA: Diagnosis not present

## 2021-07-05 DIAGNOSIS — S299XXA Unspecified injury of thorax, initial encounter: Secondary | ICD-10-CM | POA: Diagnosis not present

## 2021-07-05 DIAGNOSIS — M1711 Unilateral primary osteoarthritis, right knee: Secondary | ICD-10-CM | POA: Diagnosis not present

## 2021-07-05 DIAGNOSIS — M4807 Spinal stenosis, lumbosacral region: Secondary | ICD-10-CM | POA: Diagnosis not present

## 2021-07-05 DIAGNOSIS — S3991XA Unspecified injury of abdomen, initial encounter: Secondary | ICD-10-CM | POA: Diagnosis not present

## 2021-07-05 DIAGNOSIS — S32020A Wedge compression fracture of second lumbar vertebra, initial encounter for closed fracture: Secondary | ICD-10-CM | POA: Diagnosis not present

## 2021-07-05 DIAGNOSIS — S0990XA Unspecified injury of head, initial encounter: Secondary | ICD-10-CM | POA: Diagnosis not present

## 2021-07-05 DIAGNOSIS — Y92410 Unspecified street and highway as the place of occurrence of the external cause: Secondary | ICD-10-CM | POA: Diagnosis not present

## 2021-07-05 DIAGNOSIS — I499 Cardiac arrhythmia, unspecified: Secondary | ICD-10-CM | POA: Diagnosis not present

## 2021-07-05 DIAGNOSIS — R220 Localized swelling, mass and lump, head: Secondary | ICD-10-CM | POA: Diagnosis not present

## 2021-07-05 DIAGNOSIS — R0902 Hypoxemia: Secondary | ICD-10-CM | POA: Diagnosis not present

## 2021-07-05 DIAGNOSIS — S0993XA Unspecified injury of face, initial encounter: Secondary | ICD-10-CM | POA: Diagnosis present

## 2021-07-05 DIAGNOSIS — R2991 Unspecified symptoms and signs involving the musculoskeletal system: Secondary | ICD-10-CM | POA: Diagnosis not present

## 2021-07-05 DIAGNOSIS — R6889 Other general symptoms and signs: Secondary | ICD-10-CM | POA: Diagnosis not present

## 2021-07-05 LAB — CBC WITH DIFFERENTIAL/PLATELET
Abs Immature Granulocytes: 0.05 10*3/uL (ref 0.00–0.07)
Basophils Absolute: 0.1 10*3/uL (ref 0.0–0.1)
Basophils Relative: 1 %
Eosinophils Absolute: 0.2 10*3/uL (ref 0.0–0.5)
Eosinophils Relative: 6 %
HCT: 38.7 % — ABNORMAL LOW (ref 39.0–52.0)
Hemoglobin: 12.8 g/dL — ABNORMAL LOW (ref 13.0–17.0)
Immature Granulocytes: 1 %
Lymphocytes Relative: 30 %
Lymphs Abs: 1.1 10*3/uL (ref 0.7–4.0)
MCH: 32.2 pg (ref 26.0–34.0)
MCHC: 33.1 g/dL (ref 30.0–36.0)
MCV: 97.2 fL (ref 80.0–100.0)
Monocytes Absolute: 0.5 10*3/uL (ref 0.1–1.0)
Monocytes Relative: 13 %
Neutro Abs: 1.8 10*3/uL (ref 1.7–7.7)
Neutrophils Relative %: 49 %
Platelets: 133 10*3/uL — ABNORMAL LOW (ref 150–400)
RBC: 3.98 MIL/uL — ABNORMAL LOW (ref 4.22–5.81)
RDW: 13.2 % (ref 11.5–15.5)
WBC: 3.7 10*3/uL — ABNORMAL LOW (ref 4.0–10.5)
nRBC: 0 % (ref 0.0–0.2)

## 2021-07-05 LAB — COMPREHENSIVE METABOLIC PANEL
ALT: 37 U/L (ref 0–44)
AST: 59 U/L — ABNORMAL HIGH (ref 15–41)
Albumin: 3 g/dL — ABNORMAL LOW (ref 3.5–5.0)
Alkaline Phosphatase: 166 U/L — ABNORMAL HIGH (ref 38–126)
Anion gap: 10 (ref 5–15)
BUN: 12 mg/dL (ref 8–23)
CO2: 21 mmol/L — ABNORMAL LOW (ref 22–32)
Calcium: 8.7 mg/dL — ABNORMAL LOW (ref 8.9–10.3)
Chloride: 106 mmol/L (ref 98–111)
Creatinine, Ser: 0.91 mg/dL (ref 0.61–1.24)
GFR, Estimated: >60 mL/min (ref >60)
Glucose, Bld: 443 mg/dL — ABNORMAL HIGH (ref 70–99)
Potassium: 3.7 mmol/L (ref 3.5–5.1)
Sodium: 137 mmol/L (ref 135–145)
Total Bilirubin: 1.3 mg/dL — ABNORMAL HIGH (ref 0.3–1.2)
Total Protein: 6.3 g/dL — ABNORMAL LOW (ref 6.5–8.1)

## 2021-07-05 LAB — TROPONIN I (HIGH SENSITIVITY): Troponin I (High Sensitivity): 13 ng/L (ref <18)
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
## 2021-07-05 IMAGING — DX DG KNEE COMPLETE 4+V*R*
3 series · 4 of 4 positions shown · non-contrast
Comparison: None.

CLINICAL DATA: Status post trauma.

EXAM:
RIGHT KNEE - COMPLETE 4+ VIEW

[knee ap]
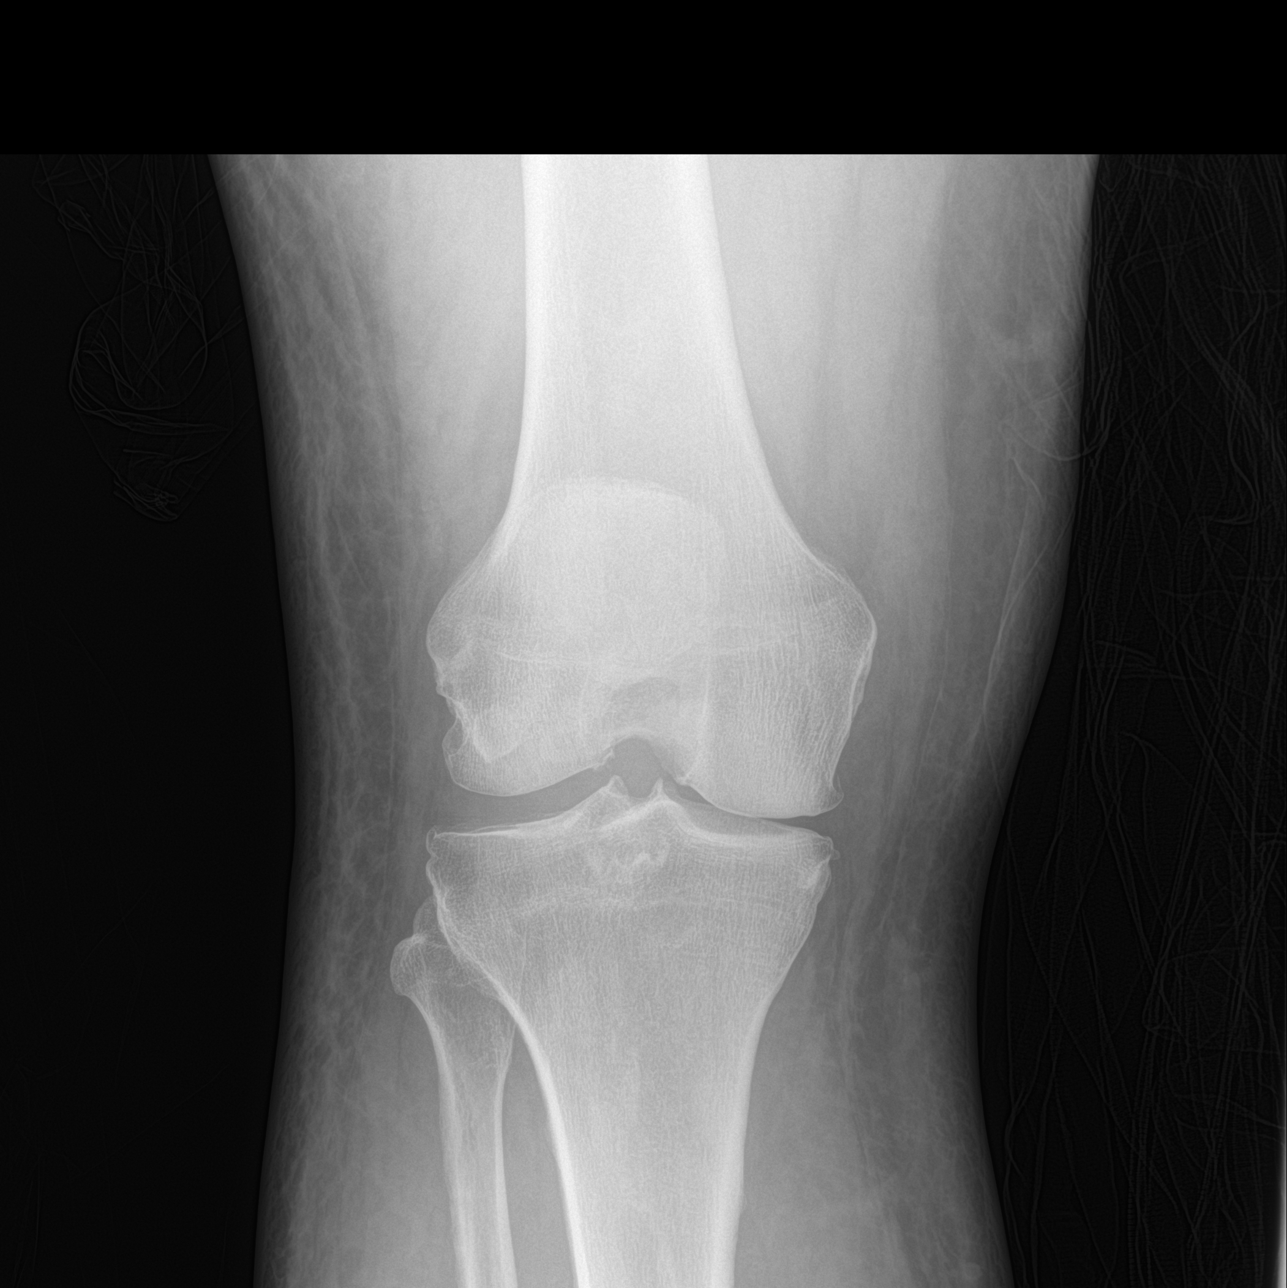

[knee lat]
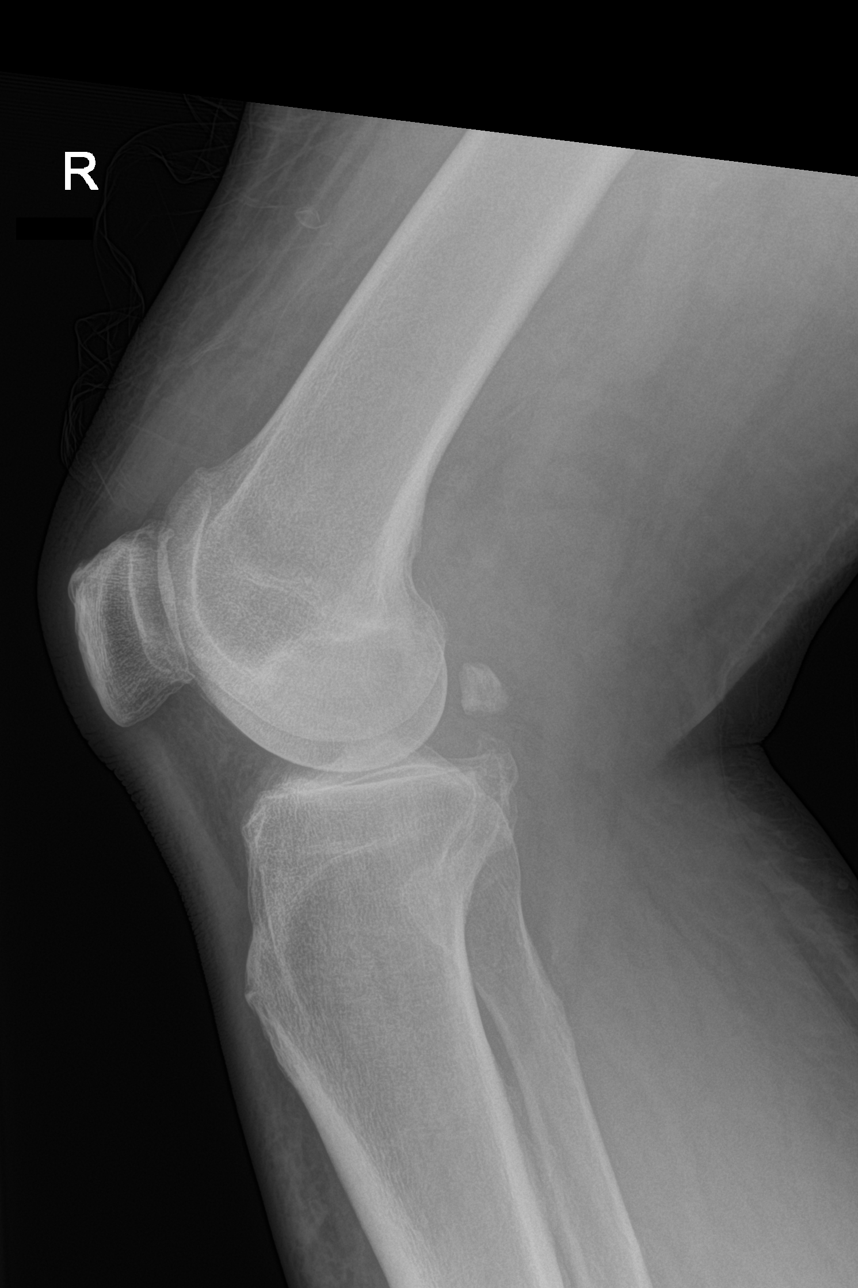

[Series 5: knee obl · 0.14mm/px · 2 of 2 slices shown]
[im 1/2]
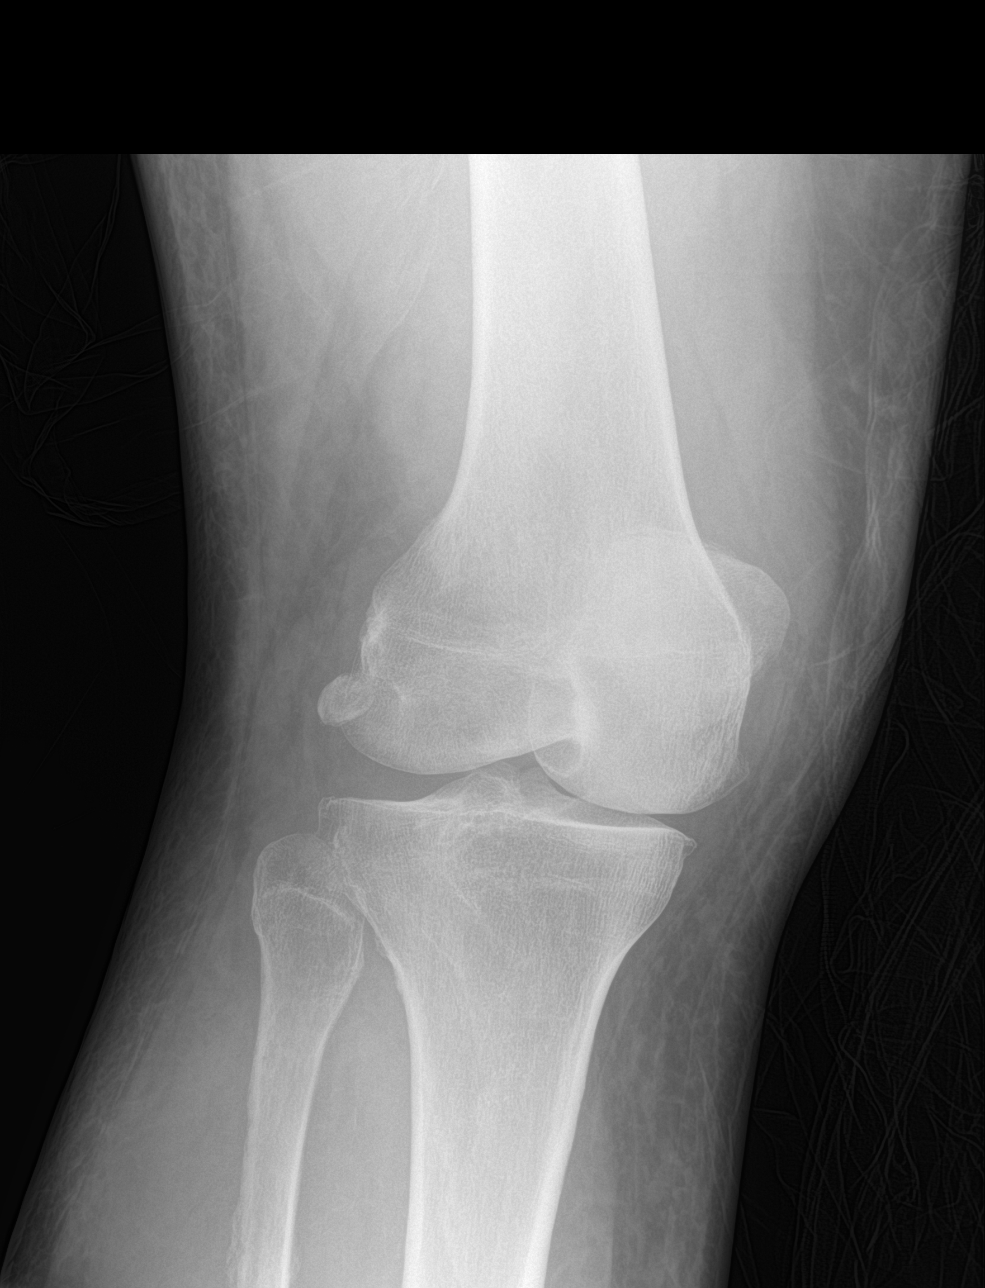
[im 2/2]
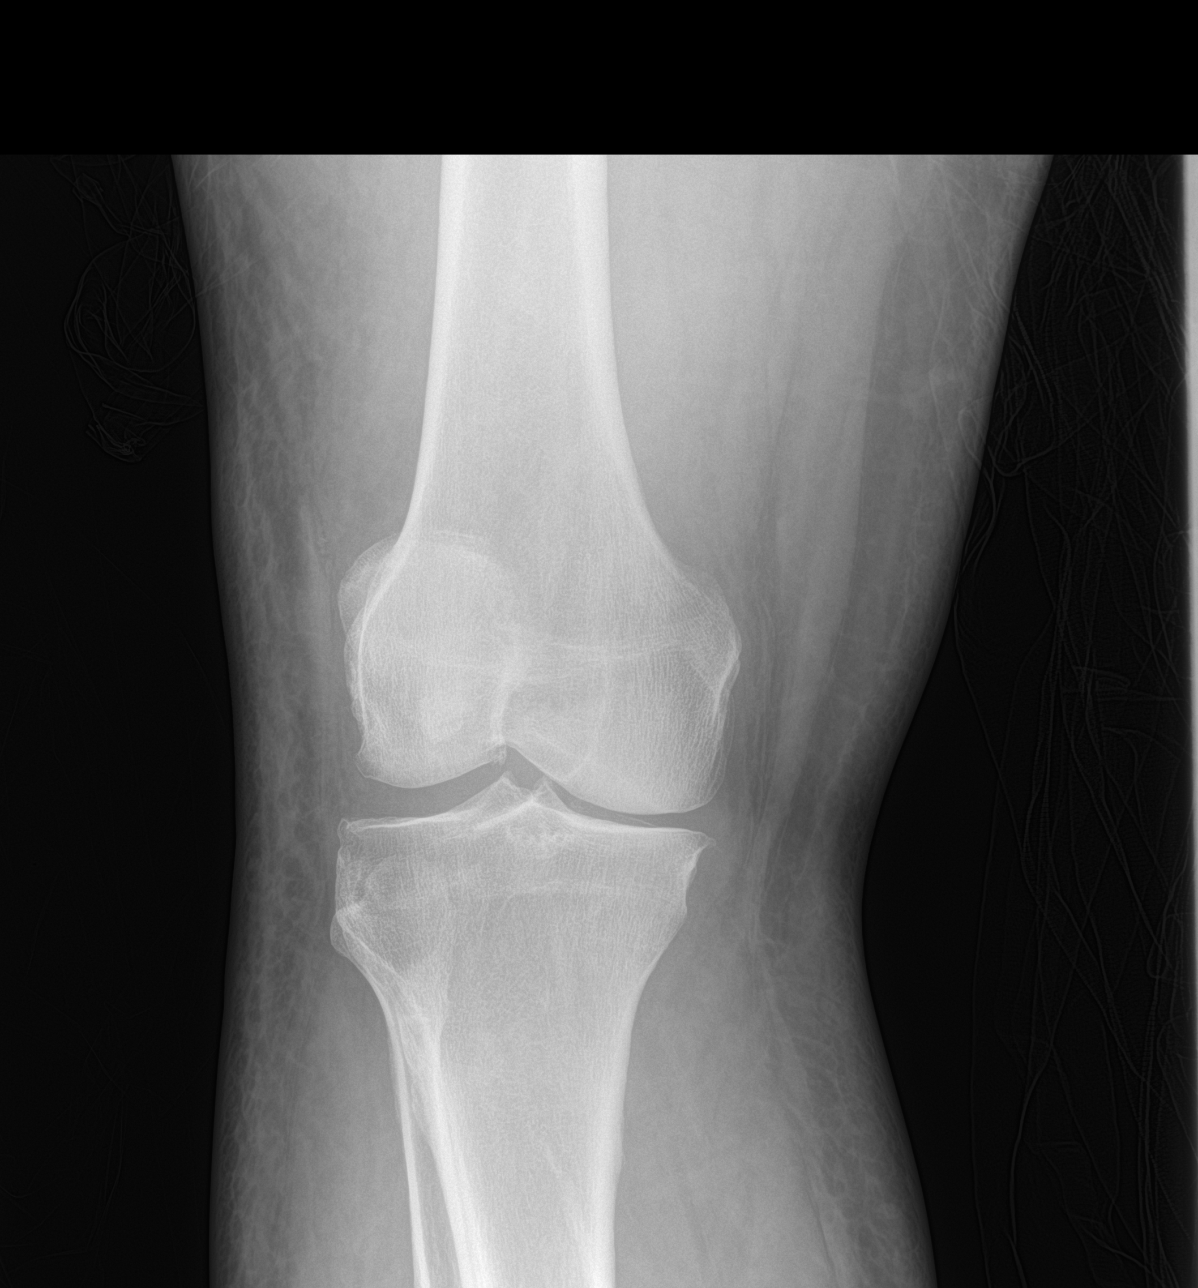

[4 of 4 positions shown; findings below may reference images not displayed]

FINDINGS: No evidence of acute fracture or dislocation. Mild to moderate
severity medial tibiofemoral compartment space narrowing is seen.
There is a small joint effusion.
IMPRESSION: 1. Degenerative changes without an acute osseous abnormality.
2. Small joint effusion.
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
## 2021-07-05 MED ORDER — LIDOCAINE-EPINEPHRINE-TETRACAINE (LET) TOPICAL GEL
3.0000 mL | Freq: Once | TOPICAL | Status: AC
  Administered 2021-07-05: 3 mL via TOPICAL
  Filled 2021-07-05: qty 3

## 2021-07-05 MED ORDER — OXYCODONE-ACETAMINOPHEN 5-325 MG PO TABS: 1.0000 | ORAL_TABLET | Freq: Four times a day (QID) | ORAL | 0 refills | Status: DC | PRN

## 2021-07-05 MED ORDER — MORPHINE SULFATE (PF) 4 MG/ML IV SOLN
4.0000 mg | Freq: Once | INTRAVENOUS | Status: AC
  Administered 2021-07-05: 4 mg via INTRAVENOUS
  Filled 2021-07-05: qty 1

## 2021-07-05 MED ORDER — OXYMETAZOLINE HCL 0.05 % NA SOLN
1.0000 | Freq: Once | NASAL | Status: AC
  Administered 2021-07-05: 1 via NASAL
  Filled 2021-07-05: qty 30

## 2021-07-05 MED ORDER — ONDANSETRON HCL 4 MG/2ML IJ SOLN
4.0000 mg | Freq: Once | INTRAMUSCULAR | Status: AC
  Administered 2021-07-05: 4 mg via INTRAVENOUS
  Filled 2021-07-05: qty 2

## 2021-07-05 MED ORDER — LIDOCAINE HCL (PF) 1 % IJ SOLN
5.0000 mL | Freq: Once | INTRAMUSCULAR | Status: AC
  Administered 2021-07-05: 5 mL
  Filled 2021-07-05: qty 5

## 2021-07-05 MED ORDER — HYDROMORPHONE HCL 1 MG/ML IJ SOLN
1.0000 mg | Freq: Once | INTRAMUSCULAR | Status: AC
  Administered 2021-07-05: 1 mg via INTRAVENOUS
  Filled 2021-07-05: qty 1
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
## 2021-07-05 NOTE — Discharge Instructions (Addendum)
See your provider in 5 days for suture removal.  Take the prescription pain medicines as needed.  Follow-up with your primary provider for ongoing symptoms. ?
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
## 2021-07-06 ENCOUNTER — Telehealth: Payer: Self-pay | Admitting: Family Medicine

## 2021-07-06 DIAGNOSIS — E119 Type 2 diabetes mellitus without complications: Secondary | ICD-10-CM

## 2021-07-06 NOTE — Telephone Encounter (Signed)
Attempted to reach patient to discuss. Left detailed voicemail that Trulicity application is still being processed, we are doing everything we can to expedite the process but still have not received approval. ?

## 2021-07-06 NOTE — Telephone Encounter (Signed)
Patient's spouse is calling to follow up on refill for this prescriptions. She said they had filled out all the required paper work . Could you please call and confirm refill was sent in.

## 2021-07-06 NOTE — Telephone Encounter (Signed)
RMA Amy Manus Rudd spoke with [employer] earlier this week, the application was still being processed. ?

## 2021-07-06 NOTE — Telephone Encounter (Signed)
On call health service called to report that Tim Hodge was having dizziness and elevated BS at 460.  ?Apparently he has been out of Trulicity for a week and currently on Amaryl 4 mg daily. ? ?Lab Results  ?Component Value Date  ? HGBA1C 8.2 (A) 05/08/2021  ? ?BS's have been 200's-390's 's in the past 1-2 years, it was 443 in the ER yesterday. ? ?I called Tim Hodge and states that "everybody is getting confused",  he is not having any dizziness or any other symptom.He is not concerned about his elevated BS's, he tells me that he called to inquired about his Trulicity PA status. ?According to pt, he was supposed to receive a call today to let him know when he is getting his medication, he has been out of Trulicity for 3 weeks. ? ?Tim Deem Martinique, MD ? ?  ? ?

## 2021-07-08 ENCOUNTER — Telehealth: Payer: Self-pay | Admitting: Family Medicine

## 2021-07-08 NOTE — Telephone Encounter (Signed)
Please get update on patient after ER eval.  Thanks.  

## 2021-07-08 NOTE — Telephone Encounter (Signed)
Please talk to me about this patient's situation and making sure he has access to Trulicity.  Thanks. ?

## 2021-07-09 ENCOUNTER — Telehealth: Payer: Self-pay

## 2021-07-09 MED ORDER — TRULICITY 3 MG/0.5ML ~~LOC~~ SOAJ: 3.0000 mg | SUBCUTANEOUS | 1 refills | Status: DC

## 2021-07-09 NOTE — Telephone Encounter (Signed)
Please see other phone note.  Thanks.  ?

## 2021-07-09 NOTE — Telephone Encounter (Signed)
Thank you for working on this and please let me know what else you hear.  ?

## 2021-07-09 NOTE — Telephone Encounter (Signed)
Patient assistance ppw is still being processed. Our hands are tied until this is approved. Mendel Ryder stated she tried to expedite this but still has not been approved yet. ?

## 2021-07-09 NOTE — Progress Notes (Signed)
Called [employer]: Spoke with representative [employer]. I informed her that I have previously called to make [employer] aware that the patient is out of medication and I was told he was moved to priority status. She informed me that she does not see any updates on his status, application or medications. I gave her exact dates of when I called and what I was told. I also voiced my concerns that the previous times I have asked for a call back from a supervisor/manager I have never received a returned call. I raised the concerned again that the patient is out of medication and I have been working on this for some time. She has put in another request for someone in management to return my call in regards to patient's Trulicity. I gave them my direct number as well as my personal cell phone number.  ? ?[name], CPP notified ? ?Marijean Niemann, RMA ?Clinical Pharmacy Assistant ?[phone] ? ?

## 2021-07-09 NOTE — Telephone Encounter (Signed)
Pt wife called checking on the status of pt Trulicity. Pt wife states that pt needs his medication because pt BS is high. Please advise. ?
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
## 2021-07-09 NOTE — Telephone Encounter (Signed)
Spoke with patients wife and advised her that ppw is still being processed with [employer] and is waiting to be approved. Advised her that Mendel Ryder has been trying to work on this for them. Rx has also been sent to [facility] to see if they can pick up some to get thru until assistance is approved.  ?

## 2021-07-09 NOTE — Telephone Encounter (Signed)
Patient is doing ok; sore and BS are up which has already been discussed in another TE. Patient is trying to get trulicity as he has been out for a month. PPW is waiting to be approved by [employer] program. Patient requested rx be sent to [facility] until this can be done. RX has been sent. Patient has appt this week with Dr. Lorelei Pont on 07/12/21 at 8:20 am. ?

## 2021-07-09 NOTE — Telephone Encounter (Signed)
Noted. Thanks.

## 2021-07-09 NOTE — Telephone Encounter (Signed)
Pt wife states that if you will call medication in at [facility] until this is settled. Please advise. ?

## 2021-07-09 NOTE — Telephone Encounter (Signed)
Please see other phone note and pt message. Sending note to DR Damita Dunnings and Janett Billow CMA. ?

## 2021-07-09 NOTE — Addendum Note (Signed)
Addended by: Sherrilee Gilles B on: 07/09/2021 09:39 AM ? ? Modules accepted: Orders ? ?

## 2021-07-09 NOTE — Telephone Encounter (Addendum)
Via pharmacy assistant 3/10: ?"Spoke with [employer]; they received all patient assistance documents uploaded through the portal. I informed the representative that this process has taken some time and our patient has now run out of Trulicity. This case has now been escalated to priority. The representative could not give me a time frame of how long we can expect that to take" ? ?I've also reached out to our [employer] representative to see about vouchers for Trulicity since this has now taken over a month for a simple re-enrollment in their assistance program. ?

## 2021-07-12 ENCOUNTER — Ambulatory Visit: Payer: Medicare Other | Admitting: Family Medicine

## 2021-07-13 ENCOUNTER — Other Ambulatory Visit: Payer: Self-pay

## 2021-07-13 ENCOUNTER — Ambulatory Visit (INDEPENDENT_AMBULATORY_CARE_PROVIDER_SITE_OTHER): Payer: Medicare Other | Admitting: Family Medicine

## 2021-07-13 ENCOUNTER — Encounter: Payer: Self-pay | Admitting: Family Medicine

## 2021-07-13 DIAGNOSIS — E119 Type 2 diabetes mellitus without complications: Secondary | ICD-10-CM

## 2021-07-13 DIAGNOSIS — G8929 Other chronic pain: Secondary | ICD-10-CM | POA: Diagnosis not present

## 2021-07-13 DIAGNOSIS — M549 Dorsalgia, unspecified: Secondary | ICD-10-CM

## 2021-07-13 MED ORDER — GLIMEPIRIDE 4 MG PO TABS: 4.0000 mg | ORAL_TABLET | Freq: Every day | ORAL | Status: DC

## 2021-07-13 MED ORDER — OXYCODONE-ACETAMINOPHEN 5-325 MG PO TABS: 0.5000 | ORAL_TABLET | Freq: Four times a day (QID) | ORAL | 0 refills | Status: DC | PRN

## 2021-07-13 NOTE — Patient Instructions (Signed)
Use oxycodone if needed, sedation caution.  ?Let me check on your trulicity supply here.   ?Update me as needed.   ?Take care.  Glad to see you. ?You should gradually get better.   ?
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
## 2021-07-15 NOTE — Assessment & Plan Note (Signed)
His sugar is improving with restarting Trulicity and he will update me as needed. ?

## 2021-07-19 NOTE — Progress Notes (Addendum)
Called [employer] to inquire on the status of patient assistance form for Trulicity. Patient has been approved starting today 07/19/2021 - 04/21/2022. I explained to the representative the dire need of the patient's medication and how has been out of the medication for some time. Representative could see notes of me requesting call backs from supervisors and being told his case would be expedited to priority. She could also see that neither happened. Representative stated I need to call [facility] [phone]) to see what could be done to expedite the shipping of patient's medication. [employer] does not offer vouchers. Representative advised me to ask for overnight shipping. She stated that the pharmacy has done it in the past, but is unsure if they will do it this time as there are so many Trulicity patient's without medication.  ? ?[facility]. They are going to priority overnight his Trulicity. They do not ship out on Friday so patient's medication will go out on Monday to be delivered on Tuesday 07/24/2021. I called patient to let him know. I spoke with patient's wife and she expressed understanding.  ? ?[name], CPP notified ? ?Marijean Niemann, RMA ?Clinical Pharmacy Assistant ?[phone] ? ? ?

## 2021-07-19 NOTE — Telephone Encounter (Signed)
Pt called checking on the status of medication Trulicity. Please advise. ?

## 2021-07-22 NOTE — Telephone Encounter (Addendum)
Called [employer] to inquire on the status of patient assistance form for Trulicity. Patient has been approved starting today 07/19/2021 - 04/21/2022. I explained to the representative the dire need of the patient's medication and how has been out of the medication for some time. Representative could see notes of me requesting call backs from supervisors and being told his case would be expedited to priority. She could also see that neither happened. Representative stated I need to call [facility] [phone] to see what could be done to expedite the shipping of patient's medication. [employer] does not offer vouchers. Representative advised me to ask for overnight shipping. She stated that the pharmacy has done it in the past, but is unsure if they will do it this time as there are so many Trulicity patient's without medication.  ?  ?[facility]. They are going to priority overnight his Trulicity. They do not ship out on Friday so patient's medication will go out on Monday to be delivered on Tuesday 07/24/2021. I called patient to let him know. I spoke with patient's wife and she expressed understanding.  ?  ?[name], CPP notified ?  ?Marijean Niemann, RMA ?Clinical Pharmacy Assistant ?[phone] ?  ?========= ?Noted.  Thanks. ?

## 2021-07-25 ENCOUNTER — Telehealth: Payer: Self-pay | Admitting: Family Medicine

## 2021-07-25 MED ORDER — OXYCODONE-ACETAMINOPHEN 5-325 MG PO TABS: 0.5000 | ORAL_TABLET | Freq: Four times a day (QID) | ORAL | 0 refills | Status: DC | PRN

## 2021-07-25 NOTE — Telephone Encounter (Signed)
Sent. Thanks.   

## 2021-07-25 NOTE — Telephone Encounter (Signed)
LOV - 07/13/21 ?Next OV - 08/06/21 ?Last refill - 07/13/21 #28/0 ? ?

## 2021-07-25 NOTE — Telephone Encounter (Signed)
?  Encourage patient to contact the pharmacy for refills or they can request refills through [facility] ? ?LAST APPOINTMENT DATE:  Please schedule appointment if longer than 1 year ? ?NEXT APPOINTMENT DATE: ? ?MEDICATION:oxyCODONE-acetaminophen (PERCOCET) 5-325 MG tablet ? ?Is the patient out of medication?  ? ?PHARMACY: ?[place], [place]   ?  ? ? ? ?Let patient know to contact pharmacy at the end of the day to make sure medication is ready. ? ?Please notify patient to allow 48-72 hours to process ? ?CLINICAL FILLS OUT ALL BELOW:  ? ?LAST REFILL: ? ?QTY: ? ?REFILL DATE: ? ? ? ?OTHER COMMENTS:  ? ? ?Okay for refill? ? ?Please advise ? ? ?  ?

## 2021-07-25 NOTE — Addendum Note (Signed)
Addended by: Tonia Ghent on: 07/25/2021 03:54 PM ? ? Modules accepted: Orders ? ?

## 2021-07-31 ENCOUNTER — Other Ambulatory Visit: Payer: Self-pay | Admitting: Family Medicine

## 2021-07-31 NOTE — Telephone Encounter (Signed)
Refill request for oxycodone 5-325 mg tabs ? ?LOV - 07/13/21 ?Next OV - 08/06/21 ?Last refill - 07/25/21 #28/0 ? ?

## 2021-08-06 ENCOUNTER — Encounter: Payer: Self-pay | Admitting: Family Medicine

## 2021-08-06 ENCOUNTER — Ambulatory Visit (INDEPENDENT_AMBULATORY_CARE_PROVIDER_SITE_OTHER): Payer: Medicare Other | Admitting: Family Medicine

## 2021-08-06 VITALS — BP 130/68 | HR 74 | Temp 97.8°F | Ht 73.0 in | Wt 311.0 lb

## 2021-08-06 DIAGNOSIS — E119 Type 2 diabetes mellitus without complications: Secondary | ICD-10-CM

## 2021-08-06 DIAGNOSIS — R0789 Other chest pain: Secondary | ICD-10-CM | POA: Diagnosis not present

## 2021-08-06 LAB — POCT GLYCOSYLATED HEMOGLOBIN (HGB A1C): Hemoglobin A1C: 7.4 % — AB (ref 4.0–5.6)

## 2021-08-06 NOTE — Patient Instructions (Addendum)
It may be that without changing your meds your sugar may still continue to improve.  If you have low sugars (<100), then stop the glimepiride.  Update me as needed.  Try to taper oxycodone as pain allows.   ?Please schedule a medicare yearly visit in about 3-4 months.  ?Take care.  Glad to see you. ?

## 2021-08-06 NOTE — Progress Notes (Signed)
Diabetes:  ?Using medications without difficulties: yes ?Hypoglycemic episodes:no ?Hyperglycemic episodes:no ?Feet problems: still with B knee pain- improved with oxycodone.  Minimal foot tingling.   ?Blood Sugars averaging: still in the 200s recently. Had been up to the 400s.   ?eye exam within last year: yes ?He is back on trulicity as of a few weeks ago.  He was off for about 3 weeks or longer prior to that, when he was trying to get the medicine.  He has trulicity supply for now.   ?No abd pain on trulicity.  Discussed GERD cautions.   ? ?Rib pain improved with oxycodone.  D/w pt about routine cautions.  Taking oxycodone at night time for pain.  D/w pt about tapering as pain allows.   ? ?Meds, vitals, and allergies reviewed.  ?ROS: Per HPI unless specifically indicated in ROS section  ? ?GEN: nad, alert and oriented ?HEENT: ncat ?NECK: supple w/o LA ?CV: rrr. ?PULM: ctab, no inc wob ?ABD: soft, +bs ?EXT: no edema ?SKIN: well perfused.   ? ?Diabetic foot exam: ?Normal inspection but old post surgical changes L foot at baseline.  ?No skin breakdown ?Calluses L 1st MT ?Normal DP pulses ?Normal sensation to light touch and monofilament ?Nails normal ? ?

## 2021-08-09 DIAGNOSIS — R0789 Other chest pain: Secondary | ICD-10-CM | POA: Insufficient documentation

## 2021-08-09 NOTE — Assessment & Plan Note (Signed)
Rib pain improved with oxycodone.  D/w pt about routine cautions.  Taking oxycodone at night time for pain.  D/w pt about tapering as pain allows.   ?

## 2021-08-09 NOTE — Assessment & Plan Note (Signed)
Discussed options ?It may be that without changing his meds his sugar may still continue to improve.  If low sugars (<100), then stop the glimepiride.  Update me as needed. Please schedule a medicare yearly visit in about 3-4 months.  ? ?Continue Trulicity in the meantime. ?

## 2021-08-22 ENCOUNTER — Telehealth: Payer: Self-pay
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
## 2021-08-27 ENCOUNTER — Other Ambulatory Visit: Payer: Self-pay | Admitting: Family Medicine

## 2021-09-12 ENCOUNTER — Telehealth: Payer: Self-pay | Admitting: Family Medicine

## 2021-09-12 NOTE — Telephone Encounter (Signed)
Please triage patient.  We may need to see him in clinic/check the spots/check labs.
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
## 2021-09-12 NOTE — Telephone Encounter (Signed)
Noted, will see at [facility].  Thanks.
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
## 2021-09-13 ENCOUNTER — Encounter: Payer: Self-pay | Admitting: Family Medicine

## 2021-09-13 ENCOUNTER — Ambulatory Visit (INDEPENDENT_AMBULATORY_CARE_PROVIDER_SITE_OTHER): Payer: Medicare Other | Admitting: Family Medicine

## 2021-09-13 VITALS — BP 142/70 | HR 86 | Temp 97.6°F | Ht 73.0 in | Wt 316.0 lb

## 2021-09-13 DIAGNOSIS — E119 Type 2 diabetes mellitus without complications: Secondary | ICD-10-CM

## 2021-09-13 DIAGNOSIS — R609 Edema, unspecified: Secondary | ICD-10-CM | POA: Diagnosis not present

## 2021-09-13 LAB — BASIC METABOLIC PANEL
BUN: 13 mg/dL (ref 6–23)
CO2: 26 mEq/L (ref 19–32)
Calcium: 8.9 mg/dL (ref 8.4–10.5)
Chloride: 110 mEq/L (ref 96–112)
Creatinine, Ser: 0.81 mg/dL (ref 0.40–1.50)
GFR: 92.66 mL/min (ref >60.00)
Glucose, Bld: 157 mg/dL — ABNORMAL HIGH (ref 70–99)
Potassium: 4 mEq/L (ref 3.5–5.1)
Sodium: 141 mEq/L (ref 135–145)

## 2021-09-13 MED ORDER — SITAGLIPTIN PHOSPHATE 100 MG PO TABS: 100.0000 mg | ORAL_TABLET | Freq: Every day | ORAL | Status: DC

## 2021-09-13 MED ORDER — GLIMEPIRIDE 4 MG PO TABS: 8.0000 mg | ORAL_TABLET | Freq: Every day | ORAL | Status: DC

## 2021-09-13 MED ORDER — FUROSEMIDE 20 MG PO TABS: 20.0000 mg | ORAL_TABLET | Freq: Every day | ORAL | 0 refills | Status: DC | PRN

## 2021-09-13 NOTE — Progress Notes (Signed)
BLE edema.  Both are better today.  He has h/o L>R BLE edema since prev injury in distant past. He hit L shin on equipment, tractor, x2.  Weeping in the meantime.  Local abrasion superiorly with small blister inferiorly on the L anterior shin.  Tdap 2016, still up to date.  Injury was 2 days ago.    He couldn't tolerate trulicity.  Stopped in the meantime.  GI sx improved.  Sugar has been ~110, was 115 this AM.  Taking glimepiride '8mg'$  daily.  Not taking januvia in the meantime-clarified with wife.  No known ADE on that med.  He didn't attribute edema to glimepiride use.    Not SOB.    Meds, vitals, and allergies reviewed.   ROS: Per HPI unless specifically indicated in ROS section   Nad Ncat Neck supple Ctab Rrr Abd soft L>R 1-2+ BLE edema 8x10 pad covers the horizontal scrape and the small distal blister on the L anterior shin.  Most of the 8x10 area is not affected but that covers both.  None look infected.  All lesions appear superficial without spreading erythema.  Small amount of weeping w/o bleeding.  Left leg was wrapped from the metatarsals to the proximal tibia with a Coban after nonstick sterile bandage was applied to the shin.

## 2021-09-13 NOTE — Patient Instructions (Addendum)
Keep checking your sugar and let me know about your readings in the next 2-3 weeks.   Elevate your feet when possible and keep the area clean.   Go to the lab on the way out.   If you have mychart we'll likely use that to update you.    Take care.  Glad to see you. Restart using compression stockings when your leg heals up.   Use lasix short term as needed for a few days.   The dressing from today should help some with the swelling.

## 2021-09-17 ENCOUNTER — Emergency Department
Admission: EM | Admit: 2021-09-17 | Discharge: 2021-09-17 | Disposition: A | Discharge status: Home / Self Care | Payer: Medicare Other | Source: Home / Self Care | Attending: Student in an Organized Health Care Education/Training Program | Admitting: Student in an Organized Health Care Education/Training Program

## 2021-09-17 ENCOUNTER — Encounter: Payer: Self-pay | Admitting: Emergency Medicine

## 2021-09-17 ENCOUNTER — Other Ambulatory Visit: Payer: Self-pay

## 2021-09-17 ENCOUNTER — Emergency Department: Payer: Medicare Other

## 2021-09-17 DIAGNOSIS — Z23 Encounter for immunization: Secondary | ICD-10-CM | POA: Insufficient documentation

## 2021-09-17 DIAGNOSIS — I10 Essential (primary) hypertension: Secondary | ICD-10-CM | POA: Diagnosis not present

## 2021-09-17 DIAGNOSIS — R519 Headache, unspecified: Secondary | ICD-10-CM | POA: Insufficient documentation

## 2021-09-17 DIAGNOSIS — M5030 Other cervical disc degeneration, unspecified cervical region: Secondary | ICD-10-CM | POA: Diagnosis not present

## 2021-09-17 DIAGNOSIS — K862 Cyst of pancreas: Secondary | ICD-10-CM | POA: Diagnosis not present

## 2021-09-17 DIAGNOSIS — E11649 Type 2 diabetes mellitus with hypoglycemia without coma: Secondary | ICD-10-CM | POA: Diagnosis not present

## 2021-09-17 DIAGNOSIS — S3993XA Unspecified injury of pelvis, initial encounter: Secondary | ICD-10-CM | POA: Diagnosis not present

## 2021-09-17 DIAGNOSIS — R109 Unspecified abdominal pain: Secondary | ICD-10-CM | POA: Insufficient documentation

## 2021-09-17 DIAGNOSIS — S301XXA Contusion of abdominal wall, initial encounter: Secondary | ICD-10-CM | POA: Diagnosis not present

## 2021-09-17 DIAGNOSIS — M79632 Pain in left forearm: Secondary | ICD-10-CM | POA: Diagnosis not present

## 2021-09-17 DIAGNOSIS — S3991XA Unspecified injury of abdomen, initial encounter: Secondary | ICD-10-CM | POA: Diagnosis not present

## 2021-09-17 DIAGNOSIS — S81812D Laceration without foreign body, left lower leg, subsequent encounter: Secondary | ICD-10-CM | POA: Diagnosis not present

## 2021-09-17 DIAGNOSIS — I5032 Chronic diastolic (congestive) heart failure: Secondary | ICD-10-CM | POA: Diagnosis not present

## 2021-09-17 DIAGNOSIS — R739 Hyperglycemia, unspecified: Secondary | ICD-10-CM | POA: Diagnosis not present

## 2021-09-17 DIAGNOSIS — D6959 Other secondary thrombocytopenia: Secondary | ICD-10-CM | POA: Diagnosis not present

## 2021-09-17 DIAGNOSIS — R310 Gross hematuria: Secondary | ICD-10-CM | POA: Diagnosis not present

## 2021-09-17 DIAGNOSIS — I700 Atherosclerosis of aorta: Secondary | ICD-10-CM | POA: Diagnosis not present

## 2021-09-17 DIAGNOSIS — S5012XA Contusion of left forearm, initial encounter: Secondary | ICD-10-CM | POA: Insufficient documentation

## 2021-09-17 DIAGNOSIS — Z743 Need for continuous supervision: Secondary | ICD-10-CM | POA: Diagnosis not present

## 2021-09-17 DIAGNOSIS — R6889 Other general symptoms and signs: Secondary | ICD-10-CM | POA: Diagnosis not present

## 2021-09-17 DIAGNOSIS — L03116 Cellulitis of left lower limb: Secondary | ICD-10-CM | POA: Diagnosis not present

## 2021-09-17 DIAGNOSIS — E538 Deficiency of other specified B group vitamins: Secondary | ICD-10-CM | POA: Diagnosis not present

## 2021-09-17 DIAGNOSIS — Z1624 Resistance to multiple antibiotics: Secondary | ICD-10-CM | POA: Diagnosis not present

## 2021-09-17 DIAGNOSIS — S81812A Laceration without foreign body, left lower leg, initial encounter: Secondary | ICD-10-CM | POA: Insufficient documentation

## 2021-09-17 DIAGNOSIS — Z7901 Long term (current) use of anticoagulants: Secondary | ICD-10-CM | POA: Insufficient documentation

## 2021-09-17 DIAGNOSIS — R609 Edema, unspecified: Secondary | ICD-10-CM | POA: Insufficient documentation

## 2021-09-17 DIAGNOSIS — K760 Fatty (change of) liver, not elsewhere classified: Secondary | ICD-10-CM | POA: Diagnosis not present

## 2021-09-17 DIAGNOSIS — I2510 Atherosclerotic heart disease of native coronary artery without angina pectoris: Secondary | ICD-10-CM | POA: Diagnosis not present

## 2021-09-17 DIAGNOSIS — Y92410 Unspecified street and highway as the place of occurrence of the external cause: Secondary | ICD-10-CM | POA: Insufficient documentation

## 2021-09-17 DIAGNOSIS — I110 Hypertensive heart disease with heart failure: Secondary | ICD-10-CM | POA: Diagnosis not present

## 2021-09-17 DIAGNOSIS — R079 Chest pain, unspecified: Secondary | ICD-10-CM | POA: Insufficient documentation

## 2021-09-17 DIAGNOSIS — M549 Dorsalgia, unspecified: Secondary | ICD-10-CM | POA: Insufficient documentation

## 2021-09-17 DIAGNOSIS — M542 Cervicalgia: Secondary | ICD-10-CM | POA: Insufficient documentation

## 2021-09-17 DIAGNOSIS — S299XXA Unspecified injury of thorax, initial encounter: Secondary | ICD-10-CM | POA: Diagnosis not present

## 2021-09-17 DIAGNOSIS — L0390 Cellulitis, unspecified: Secondary | ICD-10-CM | POA: Diagnosis not present

## 2021-09-17 DIAGNOSIS — C220 Liver cell carcinoma: Secondary | ICD-10-CM | POA: Diagnosis not present

## 2021-09-17 DIAGNOSIS — N179 Acute kidney failure, unspecified: Secondary | ICD-10-CM | POA: Diagnosis not present

## 2021-09-17 DIAGNOSIS — R600 Localized edema: Secondary | ICD-10-CM | POA: Diagnosis not present

## 2021-09-17 DIAGNOSIS — T796XXA Traumatic ischemia of muscle, initial encounter: Secondary | ICD-10-CM | POA: Diagnosis not present

## 2021-09-17 DIAGNOSIS — D62 Acute posthemorrhagic anemia: Secondary | ICD-10-CM | POA: Diagnosis not present

## 2021-09-17 DIAGNOSIS — M7989 Other specified soft tissue disorders: Secondary | ICD-10-CM | POA: Diagnosis not present

## 2021-09-17 DIAGNOSIS — N281 Cyst of kidney, acquired: Secondary | ICD-10-CM | POA: Diagnosis not present

## 2021-09-17 DIAGNOSIS — K7689 Other specified diseases of liver: Secondary | ICD-10-CM | POA: Diagnosis not present

## 2021-09-17 DIAGNOSIS — Z86711 Personal history of pulmonary embolism: Secondary | ICD-10-CM | POA: Diagnosis not present

## 2021-09-17 DIAGNOSIS — R220 Localized swelling, mass and lump, head: Secondary | ICD-10-CM | POA: Diagnosis not present

## 2021-09-17 DIAGNOSIS — S3992XA Unspecified injury of lower back, initial encounter: Secondary | ICD-10-CM | POA: Diagnosis not present

## 2021-09-17 DIAGNOSIS — B9562 Methicillin resistant Staphylococcus aureus infection as the cause of diseases classified elsewhere: Secondary | ICD-10-CM | POA: Diagnosis not present

## 2021-09-17 DIAGNOSIS — A419 Sepsis, unspecified organism: Secondary | ICD-10-CM | POA: Diagnosis not present

## 2021-09-17 DIAGNOSIS — A4102 Sepsis due to Methicillin resistant Staphylococcus aureus: Secondary | ICD-10-CM | POA: Diagnosis not present

## 2021-09-17 DIAGNOSIS — S81802D Unspecified open wound, left lower leg, subsequent encounter: Secondary | ICD-10-CM | POA: Diagnosis not present

## 2021-09-17 DIAGNOSIS — I959 Hypotension, unspecified: Secondary | ICD-10-CM | POA: Diagnosis not present

## 2021-09-17 DIAGNOSIS — G4733 Obstructive sleep apnea (adult) (pediatric): Secondary | ICD-10-CM | POA: Diagnosis not present

## 2021-09-17 DIAGNOSIS — K7460 Unspecified cirrhosis of liver: Secondary | ICD-10-CM | POA: Diagnosis not present

## 2021-09-17 DIAGNOSIS — M25552 Pain in left hip: Secondary | ICD-10-CM | POA: Diagnosis not present

## 2021-09-17 DIAGNOSIS — R6510 Systemic inflammatory response syndrome (SIRS) of non-infectious origin without acute organ dysfunction: Secondary | ICD-10-CM | POA: Diagnosis not present

## 2021-09-17 DIAGNOSIS — J9601 Acute respiratory failure with hypoxia: Secondary | ICD-10-CM | POA: Diagnosis not present

## 2021-09-17 DIAGNOSIS — R160 Hepatomegaly, not elsewhere classified: Secondary | ICD-10-CM | POA: Diagnosis not present

## 2021-09-17 DIAGNOSIS — N2889 Other specified disorders of kidney and ureter: Secondary | ICD-10-CM | POA: Diagnosis not present

## 2021-09-17 LAB — COMPREHENSIVE METABOLIC PANEL
ALT: 30 U/L (ref 0–44)
AST: 50 U/L — ABNORMAL HIGH (ref 15–41)
Albumin: 2.9 g/dL — ABNORMAL LOW (ref 3.5–5.0)
Alkaline Phosphatase: 204 U/L — ABNORMAL HIGH (ref 38–126)
Anion gap: 6 (ref 5–15)
BUN: 13 mg/dL (ref 8–23)
CO2: 26 mmol/L (ref 22–32)
Calcium: 8.6 mg/dL — ABNORMAL LOW (ref 8.9–10.3)
Chloride: 106 mmol/L (ref 98–111)
Creatinine, Ser: 0.8 mg/dL (ref 0.61–1.24)
GFR, Estimated: >60 mL/min (ref >60)
Glucose, Bld: 319 mg/dL — ABNORMAL HIGH (ref 70–99)
Potassium: 4 mmol/L (ref 3.5–5.1)
Sodium: 138 mmol/L (ref 135–145)
Total Bilirubin: 1.2 mg/dL (ref 0.3–1.2)
Total Protein: 6.2 g/dL — ABNORMAL LOW (ref 6.5–8.1)

## 2021-09-17 LAB — CBC WITH DIFFERENTIAL/PLATELET
Abs Immature Granulocytes: 0.04 10*3/uL (ref 0.00–0.07)
Basophils Absolute: 0 10*3/uL (ref 0.0–0.1)
Basophils Relative: 1 %
Eosinophils Absolute: 0.2 10*3/uL (ref 0.0–0.5)
Eosinophils Relative: 6 %
HCT: 37.8 % — ABNORMAL LOW (ref 39.0–52.0)
Hemoglobin: 12.1 g/dL — ABNORMAL LOW (ref 13.0–17.0)
Immature Granulocytes: 1 %
Lymphocytes Relative: 21 %
Lymphs Abs: 0.9 10*3/uL (ref 0.7–4.0)
MCH: 31.9 pg (ref 26.0–34.0)
MCHC: 32 g/dL (ref 30.0–36.0)
MCV: 99.7 fL (ref 80.0–100.0)
Monocytes Absolute: 0.5 10*3/uL (ref 0.1–1.0)
Monocytes Relative: 12 %
Neutro Abs: 2.5 10*3/uL (ref 1.7–7.7)
Neutrophils Relative %: 59 %
Platelets: 153 10*3/uL (ref 150–400)
RBC: 3.79 MIL/uL — ABNORMAL LOW (ref 4.22–5.81)
RDW: 14.1 % (ref 11.5–15.5)
WBC: 4.2 10*3/uL (ref 4.0–10.5)
nRBC: 0 % (ref 0.0–0.2)

## 2021-09-17 LAB — LIPASE, BLOOD: Lipase: 83 U/L — ABNORMAL HIGH (ref 11–51)

## 2021-09-17 LAB — AMMONIA: Ammonia: 41 umol/L — ABNORMAL HIGH (ref 9–35)

## 2021-09-17 LAB — PROTIME-INR
INR: 2.2 — ABNORMAL HIGH (ref 0.8–1.2)
Prothrombin Time: 24 seconds — ABNORMAL HIGH (ref 11.4–15.2)
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
## 2021-09-17 MED ORDER — LACTULOSE 10 G PO PACK: 10.0000 g | PACK | Freq: Two times a day (BID) | ORAL | 0 refills | Status: DC

## 2021-09-17 MED ORDER — OXYCODONE-ACETAMINOPHEN 5-325 MG PO TABS
1.0000 | ORAL_TABLET | Freq: Once | ORAL | Status: AC
  Administered 2021-09-17: 1 via ORAL
  Filled 2021-09-17: qty 1

## 2021-09-17 MED ORDER — ONDANSETRON HCL 4 MG/2ML IJ SOLN
4.0000 mg | Freq: Once | INTRAMUSCULAR | Status: AC
  Administered 2021-09-17: 4 mg via INTRAVENOUS
  Filled 2021-09-17: qty 2

## 2021-09-17 MED ORDER — CEPHALEXIN 500 MG PO CAPS
500.0000 mg | ORAL_CAPSULE | Freq: Once | ORAL | Status: AC
  Administered 2021-09-17: 500 mg via ORAL
  Filled 2021-09-17: qty 1

## 2021-09-17 MED ORDER — HYDROCODONE-ACETAMINOPHEN 5-325 MG PO TABS
1.0000 | ORAL_TABLET | ORAL | 0 refills | Status: DC | PRN
Start: 2021-09-17 — End: 2021-09-25

## 2021-09-17 MED ORDER — LIDOCAINE-EPINEPHRINE (PF) 2 %-1:200000 IJ SOLN
20.0000 mL | Freq: Once | INTRAMUSCULAR | Status: DC
  Filled 2021-09-17: qty 20

## 2021-09-17 MED ORDER — LIDOCAINE-EPINEPHRINE-TETRACAINE (LET) TOPICAL GEL
3.0000 mL | Freq: Once | TOPICAL | Status: AC
  Administered 2021-09-17: 3 mL via TOPICAL
  Filled 2021-09-17: qty 3

## 2021-09-17 MED ORDER — MORPHINE SULFATE (PF) 4 MG/ML IV SOLN
4.0000 mg | INTRAVENOUS | Status: DC | PRN
  Administered 2021-09-17: 4 mg via INTRAVENOUS
  Filled 2021-09-17: qty 1

## 2021-09-17 MED ORDER — TETANUS-DIPHTH-ACELL PERTUSSIS 5-2.5-18.5 LF-MCG/0.5 IM SUSY
0.5000 mL | PREFILLED_SYRINGE | Freq: Once | INTRAMUSCULAR | Status: AC
  Administered 2021-09-17: 0.5 mL via INTRAMUSCULAR
  Filled 2021-09-17: qty 0.5

## 2021-09-17 MED ORDER — CEPHALEXIN 500 MG PO CAPS: 500.0000 mg | ORAL_CAPSULE | Freq: Two times a day (BID) | ORAL | 0 refills | Status: DC

## 2021-09-17 NOTE — Assessment & Plan Note (Signed)
He has bilateral but left greater than right lower extremity edema. Discussed options. I asked him to elevate his feet when possible and keep the area clean.   See notes on labs. Restart using compression stockings when the superficial lesions heal.  He can use a Coban wrap in the meantime as needed. He can use lasix short term as needed for a few days.  Routine cautions given to patient. He will update me as needed.
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
## 2021-09-17 NOTE — ED Notes (Signed)
Pt ambulated with no concerns at this time. Pt stating they are a bit sore from laying down for so long, but that is a chronic problem
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
## 2021-09-17 NOTE — ED Notes (Signed)
Pt going for imaging at this time.  Pt wife at bedside
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
## 2021-09-17 NOTE — ED Notes (Signed)
dc ppw provided to patient, questions answered, followup and RX information provided. Pt declines VS at time of dc and provides verbal consent for dc. PT ambulatory to lobby alert and oriented at this time.
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
## 2021-09-17 NOTE — ED Notes (Signed)
Pt off in the floor having imaging done. Unable to reassess pain at this time.

## 2021-09-17 NOTE — Discharge Instructions (Addendum)
  1. No evidence of significant acute traumatic injury to the chest,  abdomen or pelvis on today's limited noncontrast CT examination.  2. Morphologic changes in the liver indicative of underlying  cirrhosis redemonstrated. Compared to the prior study, there has  been increasing conspicuity of a ill-defined lesion in segment 2 of  the liver. The possibility of developing hepatocellular carcinoma  should be considered. Follow-up nonemergent outpatient abdominal MRI  with and without IV gadolinium is strongly recommended in the near  future to better evaluate this finding and exclude malignancy.  3. Nonobstructive calculi measuring up to 5 mm in the upper pole  collecting system of the right kidney.  4. Aortic atherosclerosis, in addition to three-vessel coronary  artery disease. Please note that although the presence of coronary  artery calcium documents the presence of coronary artery disease,  the severity of this disease and any potential stenosis cannot be  assessed on this non-gated CT examination. Assessment for potential  risk factor modification, dietary therapy or pharmacologic therapy  may be warranted, if clinically indicated.  5. Additional incidental findings, as above.
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
## 2021-09-17 NOTE — Assessment & Plan Note (Signed)
Given the med changes above, I asked him to keep checking his sugar.  His sugar was 115 this morning so this is reasonable.  He can update me with his readings in about 2 to 3 weeks.

## 2021-09-18 ENCOUNTER — Other Ambulatory Visit: Payer: Self-pay

## 2021-09-18 ENCOUNTER — Emergency Department: Payer: Medicare Other

## 2021-09-18 ENCOUNTER — Inpatient Hospital Stay
Admission: EM | Admit: 2021-09-18 | Discharge: 2021-09-25 | DRG: 871 | Disposition: A | Discharge status: Home Health | Payer: Medicare Other | Attending: Internal Medicine | Admitting: Internal Medicine

## 2021-09-18 ENCOUNTER — Telehealth: Payer: Self-pay | Admitting: Family Medicine

## 2021-09-18 DIAGNOSIS — R310 Gross hematuria: Secondary | ICD-10-CM | POA: Diagnosis not present

## 2021-09-18 DIAGNOSIS — E11649 Type 2 diabetes mellitus with hypoglycemia without coma: Secondary | ICD-10-CM | POA: Diagnosis present

## 2021-09-18 DIAGNOSIS — R160 Hepatomegaly, not elsewhere classified: Secondary | ICD-10-CM | POA: Diagnosis present

## 2021-09-18 DIAGNOSIS — G4734 Idiopathic sleep related nonobstructive alveolar hypoventilation: Secondary | ICD-10-CM

## 2021-09-18 DIAGNOSIS — Z86711 Personal history of pulmonary embolism: Secondary | ICD-10-CM

## 2021-09-18 DIAGNOSIS — Z23 Encounter for immunization: Secondary | ICD-10-CM

## 2021-09-18 DIAGNOSIS — S22000A Wedge compression fracture of unspecified thoracic vertebra, initial encounter for closed fracture: Secondary | ICD-10-CM | POA: Diagnosis present

## 2021-09-18 DIAGNOSIS — Y92410 Unspecified street and highway as the place of occurrence of the external cause: Secondary | ICD-10-CM

## 2021-09-18 DIAGNOSIS — Z86010 Personal history of colonic polyps: Secondary | ICD-10-CM

## 2021-09-18 DIAGNOSIS — R600 Localized edema: Secondary | ICD-10-CM | POA: Diagnosis present

## 2021-09-18 DIAGNOSIS — Z820 Family history of epilepsy and other diseases of the nervous system: Secondary | ICD-10-CM

## 2021-09-18 DIAGNOSIS — R9431 Abnormal electrocardiogram [ECG] [EKG]: Secondary | ICD-10-CM | POA: Diagnosis present

## 2021-09-18 DIAGNOSIS — R451 Restlessness and agitation: Secondary | ICD-10-CM | POA: Diagnosis not present

## 2021-09-18 DIAGNOSIS — S5012XA Contusion of left forearm, initial encounter: Secondary | ICD-10-CM | POA: Diagnosis present

## 2021-09-18 DIAGNOSIS — A4102 Sepsis due to Methicillin resistant Staphylococcus aureus: Principal | ICD-10-CM | POA: Diagnosis present

## 2021-09-18 DIAGNOSIS — R935 Abnormal findings on diagnostic imaging of other abdominal regions, including retroperitoneum: Secondary | ICD-10-CM | POA: Diagnosis present

## 2021-09-18 DIAGNOSIS — M25532 Pain in left wrist: Secondary | ICD-10-CM | POA: Diagnosis present

## 2021-09-18 DIAGNOSIS — R58 Hemorrhage, not elsewhere classified: Secondary | ICD-10-CM | POA: Diagnosis present

## 2021-09-18 DIAGNOSIS — T368X6A Underdosing of other systemic antibiotics, initial encounter: Secondary | ICD-10-CM | POA: Diagnosis present

## 2021-09-18 DIAGNOSIS — E162 Hypoglycemia, unspecified: Secondary | ICD-10-CM | POA: Diagnosis present

## 2021-09-18 DIAGNOSIS — D649 Anemia, unspecified: Secondary | ICD-10-CM | POA: Diagnosis present

## 2021-09-18 DIAGNOSIS — Z7984 Long term (current) use of oral hypoglycemic drugs: Secondary | ICD-10-CM

## 2021-09-18 DIAGNOSIS — I700 Atherosclerosis of aorta: Secondary | ICD-10-CM | POA: Diagnosis present

## 2021-09-18 DIAGNOSIS — E119 Type 2 diabetes mellitus without complications: Secondary | ICD-10-CM | POA: Diagnosis present

## 2021-09-18 DIAGNOSIS — I5032 Chronic diastolic (congestive) heart failure: Secondary | ICD-10-CM | POA: Diagnosis present

## 2021-09-18 DIAGNOSIS — Z91041 Radiographic dye allergy status: Secondary | ICD-10-CM

## 2021-09-18 DIAGNOSIS — L03116 Cellulitis of left lower limb: Secondary | ICD-10-CM | POA: Diagnosis present

## 2021-09-18 DIAGNOSIS — Z803 Family history of malignant neoplasm of breast: Secondary | ICD-10-CM

## 2021-09-18 DIAGNOSIS — K769 Liver disease, unspecified: Secondary | ICD-10-CM | POA: Diagnosis present

## 2021-09-18 DIAGNOSIS — M542 Cervicalgia: Secondary | ICD-10-CM | POA: Diagnosis present

## 2021-09-18 DIAGNOSIS — D696 Thrombocytopenia, unspecified: Secondary | ICD-10-CM | POA: Diagnosis present

## 2021-09-18 DIAGNOSIS — Z91138 Patient's unintentional underdosing of medication regimen for other reason: Secondary | ICD-10-CM

## 2021-09-18 DIAGNOSIS — S81812A Laceration without foreign body, left lower leg, initial encounter: Secondary | ICD-10-CM | POA: Diagnosis present

## 2021-09-18 DIAGNOSIS — D62 Acute posthemorrhagic anemia: Secondary | ICD-10-CM | POA: Diagnosis not present

## 2021-09-18 DIAGNOSIS — K7460 Unspecified cirrhosis of liver: Secondary | ICD-10-CM | POA: Diagnosis present

## 2021-09-18 DIAGNOSIS — K760 Fatty (change of) liver, not elsewhere classified: Secondary | ICD-10-CM | POA: Diagnosis present

## 2021-09-18 DIAGNOSIS — S301XXA Contusion of abdominal wall, initial encounter: Secondary | ICD-10-CM | POA: Diagnosis present

## 2021-09-18 DIAGNOSIS — K862 Cyst of pancreas: Secondary | ICD-10-CM | POA: Diagnosis present

## 2021-09-18 DIAGNOSIS — Z6841 Body Mass Index (BMI) 40.0 and over, adult: Secondary | ICD-10-CM

## 2021-09-18 DIAGNOSIS — Z86718 Personal history of other venous thrombosis and embolism: Secondary | ICD-10-CM

## 2021-09-18 DIAGNOSIS — I2510 Atherosclerotic heart disease of native coronary artery without angina pectoris: Secondary | ICD-10-CM | POA: Diagnosis present

## 2021-09-18 DIAGNOSIS — M4854XD Collapsed vertebra, not elsewhere classified, thoracic region, subsequent encounter for fracture with routine healing: Secondary | ICD-10-CM | POA: Diagnosis present

## 2021-09-18 DIAGNOSIS — N179 Acute kidney failure, unspecified: Secondary | ICD-10-CM | POA: Diagnosis present

## 2021-09-18 DIAGNOSIS — Z79899 Other long term (current) drug therapy: Secondary | ICD-10-CM

## 2021-09-18 DIAGNOSIS — E538 Deficiency of other specified B group vitamins: Secondary | ICD-10-CM | POA: Diagnosis present

## 2021-09-18 DIAGNOSIS — A419 Sepsis, unspecified organism: Secondary | ICD-10-CM | POA: Diagnosis present

## 2021-09-18 DIAGNOSIS — R3129 Other microscopic hematuria: Secondary | ICD-10-CM | POA: Diagnosis present

## 2021-09-18 DIAGNOSIS — T148XXA Other injury of unspecified body region, initial encounter: Secondary | ICD-10-CM | POA: Diagnosis present

## 2021-09-18 DIAGNOSIS — N281 Cyst of kidney, acquired: Secondary | ICD-10-CM | POA: Diagnosis present

## 2021-09-18 DIAGNOSIS — Z823 Family history of stroke: Secondary | ICD-10-CM

## 2021-09-18 DIAGNOSIS — M6282 Rhabdomyolysis: Secondary | ICD-10-CM | POA: Diagnosis present

## 2021-09-18 DIAGNOSIS — T45515A Adverse effect of anticoagulants, initial encounter: Secondary | ICD-10-CM | POA: Diagnosis present

## 2021-09-18 DIAGNOSIS — Z888 Allergy status to other drugs, medicaments and biological substances status: Secondary | ICD-10-CM

## 2021-09-18 DIAGNOSIS — I10 Essential (primary) hypertension: Secondary | ICD-10-CM | POA: Diagnosis present

## 2021-09-18 DIAGNOSIS — D6959 Other secondary thrombocytopenia: Secondary | ICD-10-CM | POA: Diagnosis present

## 2021-09-18 DIAGNOSIS — I959 Hypotension, unspecified: Secondary | ICD-10-CM | POA: Diagnosis present

## 2021-09-18 DIAGNOSIS — M25552 Pain in left hip: Secondary | ICD-10-CM | POA: Diagnosis present

## 2021-09-18 DIAGNOSIS — G4733 Obstructive sleep apnea (adult) (pediatric): Secondary | ICD-10-CM | POA: Diagnosis present

## 2021-09-18 DIAGNOSIS — I110 Hypertensive heart disease with heart failure: Secondary | ICD-10-CM | POA: Diagnosis present

## 2021-09-18 DIAGNOSIS — T796XXA Traumatic ischemia of muscle, initial encounter: Secondary | ICD-10-CM | POA: Diagnosis present

## 2021-09-18 DIAGNOSIS — J9601 Acute respiratory failure with hypoxia: Secondary | ICD-10-CM | POA: Diagnosis present

## 2021-09-18 DIAGNOSIS — Z87891 Personal history of nicotine dependence: Secondary | ICD-10-CM

## 2021-09-18 LAB — CBC WITH DIFFERENTIAL/PLATELET
Abs Immature Granulocytes: 0.1 10*3/uL — ABNORMAL HIGH (ref 0.00–0.07)
Basophils Absolute: 0.1 10*3/uL (ref 0.0–0.1)
Basophils Relative: 0 %
Eosinophils Absolute: 0 10*3/uL (ref 0.0–0.5)
Eosinophils Relative: 0 %
HCT: 35.2 % — ABNORMAL LOW (ref 39.0–52.0)
Hemoglobin: 11.5 g/dL — ABNORMAL LOW (ref 13.0–17.0)
Immature Granulocytes: 1 %
Lymphocytes Relative: 8 %
Lymphs Abs: 1.2 10*3/uL (ref 0.7–4.0)
MCH: 33 pg (ref 26.0–34.0)
MCHC: 32.7 g/dL (ref 30.0–36.0)
MCV: 100.9 fL — ABNORMAL HIGH (ref 80.0–100.0)
Monocytes Absolute: 1.6 10*3/uL — ABNORMAL HIGH (ref 0.1–1.0)
Monocytes Relative: 11 %
Neutro Abs: 12.2 10*3/uL — ABNORMAL HIGH (ref 1.7–7.7)
Neutrophils Relative %: 80 %
Platelets: 139 10*3/uL — ABNORMAL LOW (ref 150–400)
RBC: 3.49 MIL/uL — ABNORMAL LOW (ref 4.22–5.81)
RDW: 14.3 % (ref 11.5–15.5)
WBC: 15.2 10*3/uL — ABNORMAL HIGH (ref 4.0–10.5)
nRBC: 0 % (ref 0.0–0.2)

## 2021-09-18 LAB — URINALYSIS, ROUTINE W REFLEX MICROSCOPIC
Bilirubin Urine: NEGATIVE
Glucose, UA: NEGATIVE mg/dL
Ketones, ur: NEGATIVE mg/dL
Leukocytes,Ua: NEGATIVE
Nitrite: NEGATIVE
Protein, ur: 30 mg/dL — AB
Specific Gravity, Urine: 1.023 (ref 1.005–1.030)
pH: 5 (ref 5.0–8.0)

## 2021-09-18 LAB — CBG MONITORING, ED: Glucose-Capillary: 97 mg/dL (ref 70–99)

## 2021-09-18 LAB — COMPREHENSIVE METABOLIC PANEL
ALT: 30 U/L (ref 0–44)
AST: 48 U/L — ABNORMAL HIGH (ref 15–41)
Albumin: 2.8 g/dL — ABNORMAL LOW (ref 3.5–5.0)
Alkaline Phosphatase: 154 U/L — ABNORMAL HIGH (ref 38–126)
Anion gap: 9 (ref 5–15)
BUN: 37 mg/dL — ABNORMAL HIGH (ref 8–23)
CO2: 23 mmol/L (ref 22–32)
Calcium: 8.6 mg/dL — ABNORMAL LOW (ref 8.9–10.3)
Chloride: 106 mmol/L (ref 98–111)
Creatinine, Ser: 1.95 mg/dL — ABNORMAL HIGH (ref 0.61–1.24)
GFR, Estimated: 37 mL/min — ABNORMAL LOW (ref >60)
Glucose, Bld: 96 mg/dL (ref 70–99)
Potassium: 4.1 mmol/L (ref 3.5–5.1)
Sodium: 138 mmol/L (ref 135–145)
Total Bilirubin: 1.9 mg/dL — ABNORMAL HIGH (ref 0.3–1.2)
Total Protein: 5.9 g/dL — ABNORMAL LOW (ref 6.5–8.1)

## 2021-09-18 LAB — CK: Total CK: 558 U/L — ABNORMAL HIGH (ref 49–397)

## 2021-09-18 IMAGING — MR MR ABDOMEN WO/W CM
12 series · 48 of 48 positions shown · IV contrast (10ml Gadavist)
Comparison: None Available.

CLINICAL DATA: Liver lesions on CT

EXAM:
MRI ABDOMEN WITHOUT AND WITH CONTRAST
TECHNIQUE: Multiplanar multisequence MR imaging of the abdomen was performed
both before and after the administration of intravenous contrast.
CONTRAST:  10mL GADAVIST GADOBUTROL 1 MMOL/ML IV SOLN

[Series 3: T2 · coronal · 6.0mm · 1.19mm/px · 2 of 37 slices shown (1 of 2)]
[im 1/37]
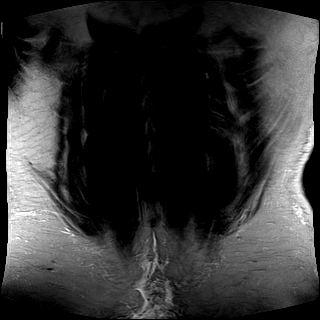
[im 37/37]
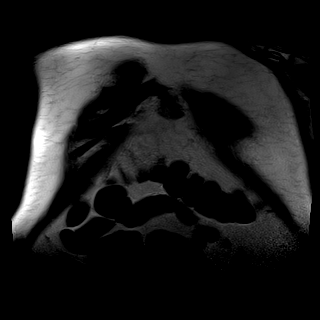

[Series 10: T2 fat-sat · axial · 6.0mm · 1.19mm/px · z∈[-219,+112]mm · 2 of 47 slices shown]
[im 1/47]
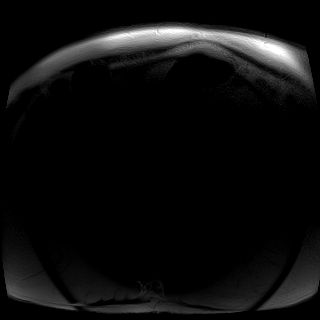
[im 47/47]
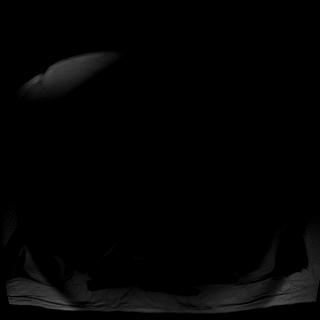

[Series 11: T2 · axial · 6.0mm · 1.19mm/px · 1 of 47 slices shown (2 of 2)]
[im 1/47]
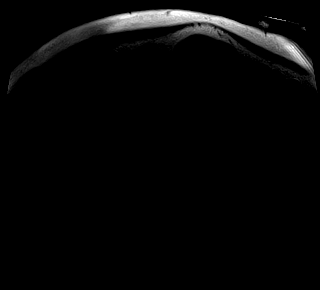

[Series 12: in & out · axial · 3.0mm · 1.20mm/px · z∈[-220,+113]mm · 4 of 112 slices shown (1 of 2)]
[im 1/112]
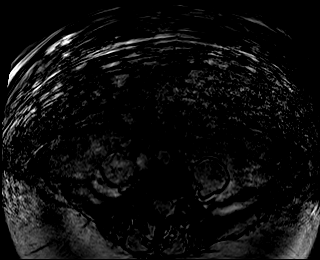
[im 38/112]
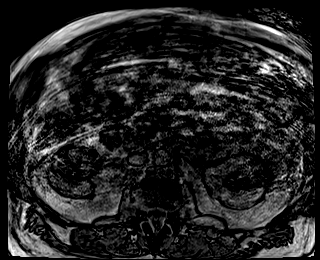
[im 75/112]
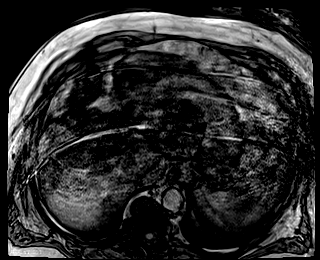
[im 112/112]
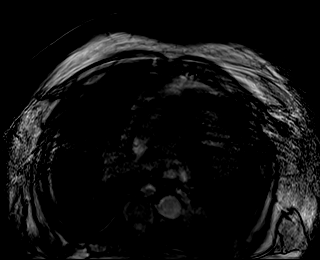

[Series 13: in & out · axial · 3.0mm · 1.20mm/px · z∈[-220,+113]mm · 4 of 112 slices shown (2 of 2)]
[im 1/112]
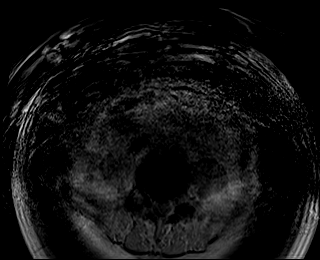
[im 38/112]
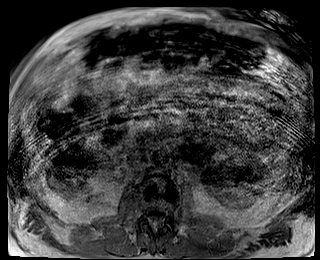
[im 75/112]
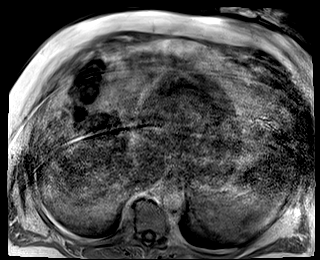
[im 112/112]
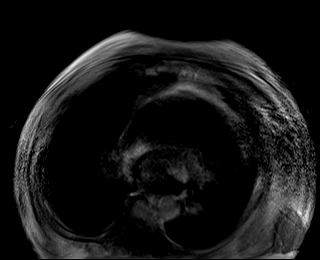

[Series 14: ax dwi_tracew · axial · 6.0mm · 1.42mm/px · z∈[-216,+108]mm · 4 of 138 slices shown]
[im 1/138]
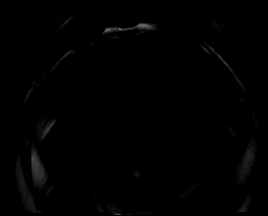
[im 46/138]
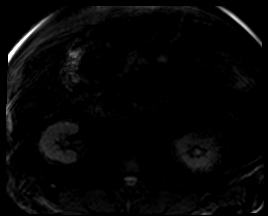
[im 92/138]
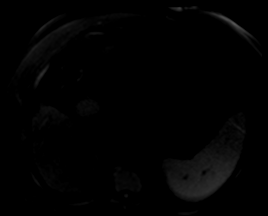
[im 138/138]
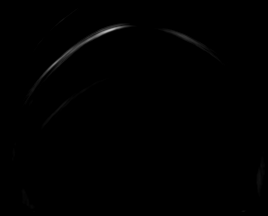

[Series 15: ax dwi_adc · axial · 6.0mm · 1.42mm/px · 1 of 46 slices shown]
[im 1/46]
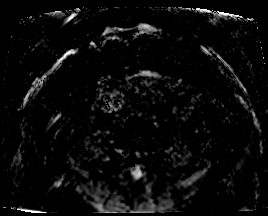

[Series 16: bSSFP · axial · 6.0mm · 0.74mm/px · 1 of 47 slices shown]
[im 1/47]
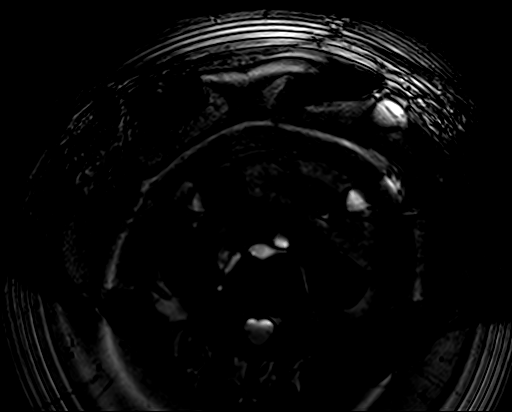

[Series 17: t1_vibe-grasp_fs_tra_static · axial · 5.0mm · 1.48mm/px · z∈[-228,+127]mm · 2 of 72 slices shown]
[im 1/72]
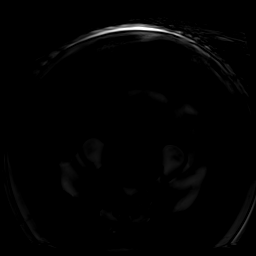
[im 72/72]
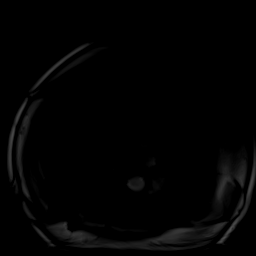

[Series 18: T1 dynamic post-contrast · coronal · 4.0mm · 1.31mm/px · 2 of 72 slices shown]
[im 1/72]
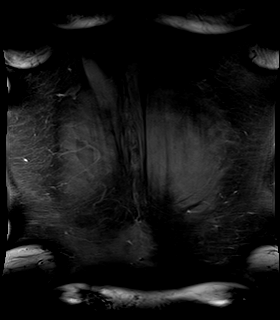
[im 72/72]
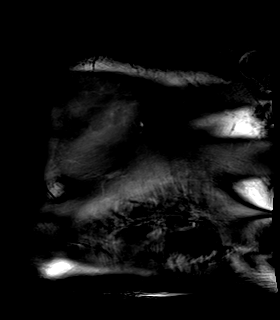

[Series 19: t1_vibe-grasp_fs_tra · axial · 5.0mm · 1.48mm/px · z∈[-228,+127]mm · 14 of 432 slices shown]
[im 1/432]
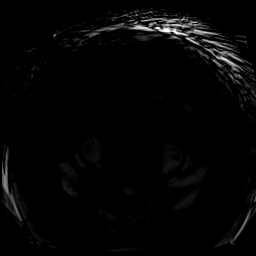
[im 34/432]
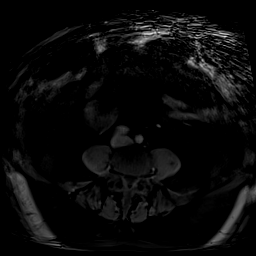
[im 67/432]
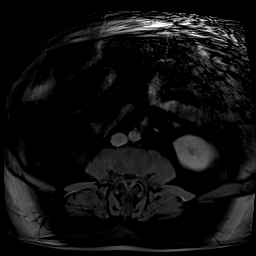
[im 100/432]
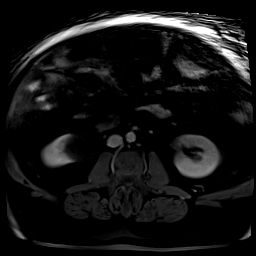
[im 133/432]
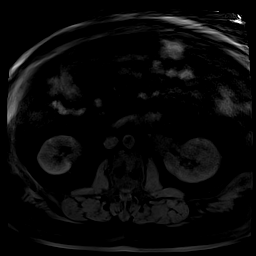
[im 166/432]
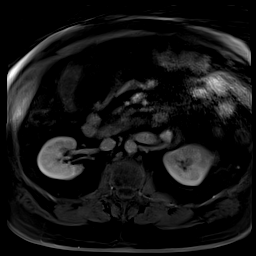
[im 199/432]
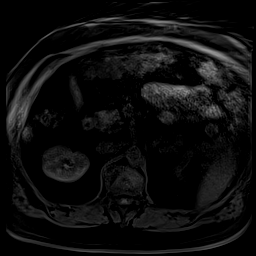
[im 233/432]
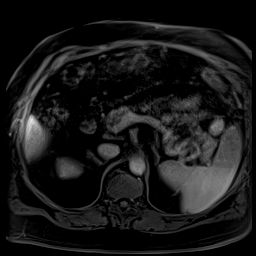
[im 266/432]
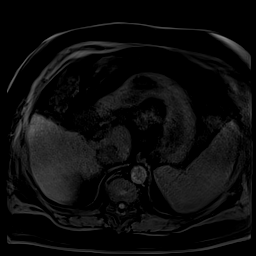
[im 299/432]
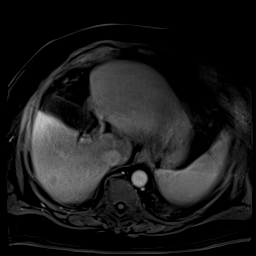
[im 332/432]
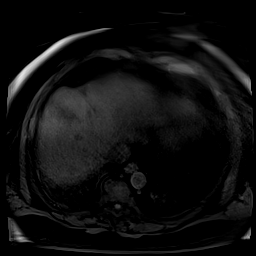
[im 365/432]
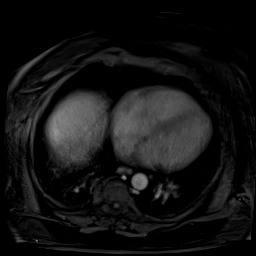
[im 398/432]
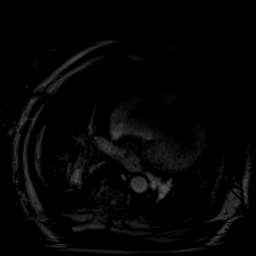
[im 432/432]
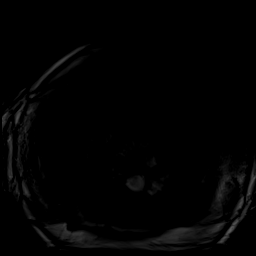

[Series 20: t1_vibe-grasp_fs_tra_sub · axial · 5.0mm · 1.48mm/px · z∈[-228,+127]mm · 11 of 360 slices shown]
[im 1/360]
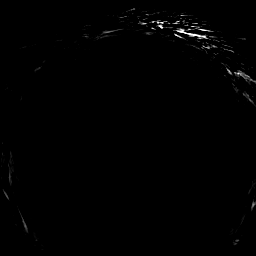
[im 36/360]
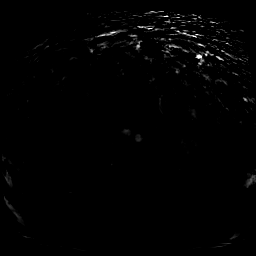
[im 72/360]
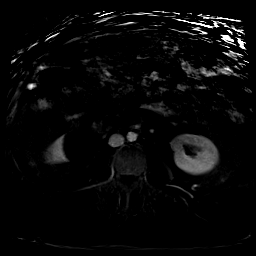
[im 108/360]
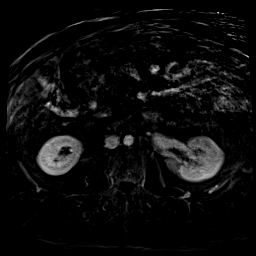
[im 144/360]
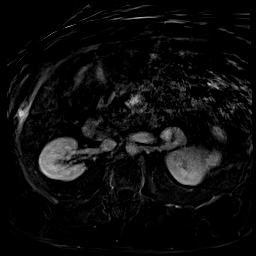
[im 180/360]
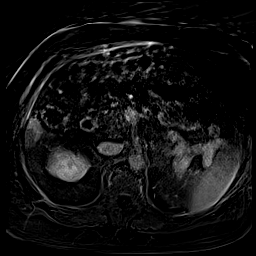
[im 216/360]
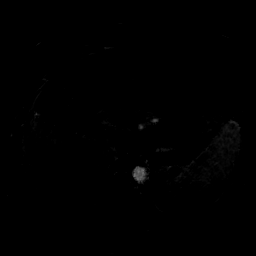
[im 252/360]
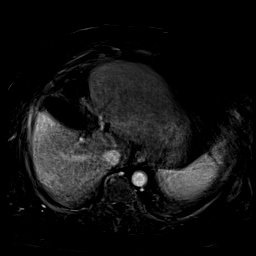
[im 288/360]
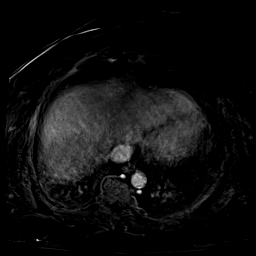
[im 324/360]
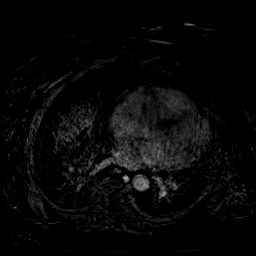
[im 360/360]
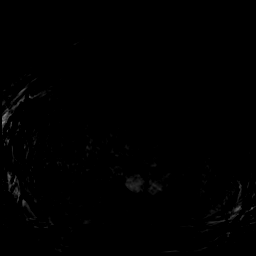

[48 of 48 positions shown; findings below may reference images not displayed]

FINDINGS: Markedly limited evaluation due to severe motion degradation.

Lower chest: Lung bases are clear.

Hepatobiliary: Suspected macronodular cirrhosis. Cannot assess for
hepatic steatosis due to severe artifact. Dynamic postcontrast
imaging was not obtained due to severe motion degradation.
Postcontrast free breathing was attempted during a single arterial
phase, with a suspected 3.8 cm ill-defined lesion in segment 2
(series 17/image 21) and a 2.2 cm ill-defined lesion in segment 7
(series 17/image 23). Neither can be sufficiently characterized on
this study. Hepatocellular carcinoma remains a concern.

Gallbladders unremarkable. No intrahepatic or extrahepatic ductal
dilatation.

Pancreas: Numerous pancreatic cysts, including a 2.3 cm septated
cyst in the uncinate process (series 10/image 26) and a 2.1 cm
unilocular cyst in the pancreatic body (series 10/image 18).
Additional numerous subcentimeter cysts throughout the pancreatic
tail (series 10/image 20). These are poorly evaluated but favor
pancreatic IPMNs. No main pancreatic ductal dilatation.

Spleen:  Within normal limits.

Adrenals/Urinary Tract:  Adrenal glands are within normal limits.

Left renal sinus cysts. 5.7 cm right lower pole renal cyst (series
10/image 40), benign (Bosniak I). No hydronephrosis.

Stomach/Bowel: Stomach and visualized bowel are grossly
unremarkable.

Vascular/Lymphatic:  No evidence of abdominal aortic aneurysm.

No suspicious abdominal lymphadenopathy.

Other:  No abdominal ascites.

Musculoskeletal: Prior vertebral augmentation at L1-2.
IMPRESSION: Severely limited evaluation due to severe motion degradation. Study
is not considered diagnostic for evaluation of the patient's liver
lesions. Dedicated MRI abdomen with/without contrast is suggested in
4-6 weeks as an outpatient.

Suspected macronodular cirrhosis. Two liver lesions, measuring up to
3.8 cm, as described above. Neither can be sufficiently
characterized on this study. Hepatocellular carcinoma remains a
concern.

Numerous pancreatic cysts, poorly evaluated but favoring pancreatic
IPMNs.

## 2021-09-18 MED ORDER — HYDRALAZINE HCL 20 MG/ML IJ SOLN: 10.0000 mg | Freq: Four times a day (QID) | INTRAMUSCULAR | Status: DC | PRN

## 2021-09-18 MED ORDER — MORPHINE SULFATE (PF) 4 MG/ML IV SOLN
4.0000 mg | Freq: Once | INTRAVENOUS | Status: AC
  Administered 2021-09-18: 4 mg via INTRAVENOUS
  Filled 2021-09-18: qty 1

## 2021-09-18 MED ORDER — ONDANSETRON HCL 4 MG/2ML IJ SOLN
4.0000 mg | Freq: Once | INTRAMUSCULAR | Status: AC
  Administered 2021-09-18: 4 mg via INTRAVENOUS
  Filled 2021-09-18: qty 2

## 2021-09-18 MED ORDER — PANTOPRAZOLE SODIUM 40 MG IV SOLR
40.0000 mg | Freq: Two times a day (BID) | INTRAVENOUS | Status: DC
  Administered 2021-09-19 – 2021-09-21 (×7): 40 mg via INTRAVENOUS
  Filled 2021-09-18 (×7): qty 10

## 2021-09-18 MED ORDER — SODIUM CHLORIDE 0.9% FLUSH
3.0000 mL | Freq: Two times a day (BID) | INTRAVENOUS | Status: DC
  Administered 2021-09-19 – 2021-09-25 (×14): 3 mL via INTRAVENOUS

## 2021-09-18 MED ORDER — GADOBUTROL 1 MMOL/ML IV SOLN
10.0000 mL | Freq: Once | INTRAVENOUS | Status: AC | PRN
  Administered 2021-09-18: 10 mL via INTRAVENOUS

## 2021-09-18 MED ORDER — LORAZEPAM 2 MG/ML IJ SOLN
0.5000 mg | Freq: Once | INTRAMUSCULAR | Status: AC
  Administered 2021-09-18: 0.5 mg via INTRAVENOUS

## 2021-09-18 MED ORDER — LORAZEPAM 2 MG/ML IJ SOLN
0.5000 mg | Freq: Once | INTRAMUSCULAR | Status: DC
  Filled 2021-09-18: qty 1

## 2021-09-18 MED ORDER — HYDROMORPHONE HCL 1 MG/ML IJ SOLN
1.0000 mg | Freq: Once | INTRAMUSCULAR | Status: AC
  Administered 2021-09-18: 1 mg via INTRAVENOUS
  Filled 2021-09-18: qty 1

## 2021-09-18 MED ORDER — SODIUM CHLORIDE 0.9 % IV BOLUS
1000.0000 mL | Freq: Once | INTRAVENOUS | Status: AC
  Administered 2021-09-18: 1000 mL via INTRAVENOUS

## 2021-09-18 MED ORDER — INSULIN ASPART 100 UNIT/ML IJ SOLN: 0.0000 [IU] | Freq: Three times a day (TID) | INTRAMUSCULAR | Status: DC
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
## 2021-09-18 NOTE — ED Notes (Signed)
[facility] called. Called about Lactulose ordered.  State that they do not have packets, only liquid.  Chart reviewed by Dr. Cherylann Banas, Lactulose liquid 10gm/15 ml po BID ordered for a 15 day supply.
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
## 2021-09-18 NOTE — ED Notes (Signed)
Patient in MRI at this time. 
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
## 2021-09-18 NOTE — Telephone Encounter (Signed)
Patient is currently at ED

## 2021-09-18 NOTE — ED Provider Notes (Signed)
Emergency department handoff note  Care of this patient was signed out to me at the end of the previous provider shift.  All pertinent patient information was conveyed and all questions were answered.  Patient pending MRI of the abdomen with without contrast that showed redemonstration of likely cirrhosis as well as an exophytic mass to the right kidney without any evidence of acute traumatic injuries.  Patient does however have evidence of acute kidney injury with elevated CK concerning for rhabdomyolysis.  Therefore patient will require admission to the internal medicine service for further evaluation and management.  I spoke to the family at length regarding the findings on labs/MRI as well as reason for admission and patient as well as family agreed with plan as well as all questions answered to the best of my ability.  Dispo: Admit to medicine  Impression: AKI Traumatic rhabdomyolysis    Naaman Plummer, MD 09/18/21 2314
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
## 2021-09-19 ENCOUNTER — Observation Stay: Payer: Medicare Other

## 2021-09-19 ENCOUNTER — Encounter: Payer: Self-pay | Admitting: Internal Medicine

## 2021-09-19 ENCOUNTER — Observation Stay
Admit: 2021-09-19 | Discharge: 2021-09-19 | Disposition: A | Discharge status: Home / Self Care | Payer: Medicare Other | Attending: Internal Medicine | Admitting: Internal Medicine

## 2021-09-19 DIAGNOSIS — L03116 Cellulitis of left lower limb: Secondary | ICD-10-CM | POA: Diagnosis not present

## 2021-09-19 DIAGNOSIS — E538 Deficiency of other specified B group vitamins: Secondary | ICD-10-CM | POA: Diagnosis present

## 2021-09-19 DIAGNOSIS — K760 Fatty (change of) liver, not elsewhere classified: Secondary | ICD-10-CM | POA: Diagnosis present

## 2021-09-19 DIAGNOSIS — T148XXA Other injury of unspecified body region, initial encounter: Secondary | ICD-10-CM | POA: Diagnosis present

## 2021-09-19 DIAGNOSIS — T796XXA Traumatic ischemia of muscle, initial encounter: Secondary | ICD-10-CM | POA: Diagnosis present

## 2021-09-19 DIAGNOSIS — Z86711 Personal history of pulmonary embolism: Secondary | ICD-10-CM | POA: Diagnosis not present

## 2021-09-19 DIAGNOSIS — S81812A Laceration without foreign body, left lower leg, initial encounter: Secondary | ICD-10-CM

## 2021-09-19 DIAGNOSIS — N179 Acute kidney failure, unspecified: Secondary | ICD-10-CM | POA: Diagnosis not present

## 2021-09-19 DIAGNOSIS — D62 Acute posthemorrhagic anemia: Secondary | ICD-10-CM | POA: Diagnosis not present

## 2021-09-19 DIAGNOSIS — A419 Sepsis, unspecified organism: Secondary | ICD-10-CM | POA: Diagnosis present

## 2021-09-19 DIAGNOSIS — B9562 Methicillin resistant Staphylococcus aureus infection as the cause of diseases classified elsewhere: Secondary | ICD-10-CM | POA: Diagnosis not present

## 2021-09-19 DIAGNOSIS — I509 Heart failure, unspecified: Secondary | ICD-10-CM | POA: Diagnosis not present

## 2021-09-19 DIAGNOSIS — K862 Cyst of pancreas: Secondary | ICD-10-CM | POA: Diagnosis present

## 2021-09-19 DIAGNOSIS — I10 Essential (primary) hypertension: Secondary | ICD-10-CM | POA: Diagnosis not present

## 2021-09-19 DIAGNOSIS — Y92410 Unspecified street and highway as the place of occurrence of the external cause: Secondary | ICD-10-CM | POA: Diagnosis not present

## 2021-09-19 DIAGNOSIS — K7460 Unspecified cirrhosis of liver: Secondary | ICD-10-CM | POA: Diagnosis present

## 2021-09-19 DIAGNOSIS — N281 Cyst of kidney, acquired: Secondary | ICD-10-CM

## 2021-09-19 DIAGNOSIS — S79912A Unspecified injury of left hip, initial encounter: Secondary | ICD-10-CM | POA: Diagnosis not present

## 2021-09-19 DIAGNOSIS — S22000A Wedge compression fracture of unspecified thoracic vertebra, initial encounter for closed fracture: Secondary | ICD-10-CM | POA: Diagnosis present

## 2021-09-19 DIAGNOSIS — M25552 Pain in left hip: Secondary | ICD-10-CM | POA: Diagnosis present

## 2021-09-19 DIAGNOSIS — I5032 Chronic diastolic (congestive) heart failure: Secondary | ICD-10-CM | POA: Diagnosis present

## 2021-09-19 DIAGNOSIS — J9601 Acute respiratory failure with hypoxia: Secondary | ICD-10-CM | POA: Diagnosis present

## 2021-09-19 DIAGNOSIS — E162 Hypoglycemia, unspecified: Secondary | ICD-10-CM | POA: Diagnosis present

## 2021-09-19 DIAGNOSIS — E11649 Type 2 diabetes mellitus with hypoglycemia without coma: Secondary | ICD-10-CM | POA: Diagnosis present

## 2021-09-19 DIAGNOSIS — N2889 Other specified disorders of kidney and ureter: Secondary | ICD-10-CM | POA: Diagnosis not present

## 2021-09-19 DIAGNOSIS — Z6841 Body Mass Index (BMI) 40.0 and over, adult: Secondary | ICD-10-CM | POA: Diagnosis not present

## 2021-09-19 DIAGNOSIS — R6510 Systemic inflammatory response syndrome (SIRS) of non-infectious origin without acute organ dysfunction: Secondary | ICD-10-CM | POA: Diagnosis present

## 2021-09-19 DIAGNOSIS — A4102 Sepsis due to Methicillin resistant Staphylococcus aureus: Secondary | ICD-10-CM | POA: Diagnosis present

## 2021-09-19 DIAGNOSIS — R160 Hepatomegaly, not elsewhere classified: Secondary | ICD-10-CM | POA: Diagnosis present

## 2021-09-19 DIAGNOSIS — I959 Hypotension, unspecified: Secondary | ICD-10-CM | POA: Diagnosis present

## 2021-09-19 DIAGNOSIS — R600 Localized edema: Secondary | ICD-10-CM | POA: Diagnosis not present

## 2021-09-19 DIAGNOSIS — L0390 Cellulitis, unspecified: Secondary | ICD-10-CM | POA: Diagnosis not present

## 2021-09-19 DIAGNOSIS — D6959 Other secondary thrombocytopenia: Secondary | ICD-10-CM | POA: Diagnosis present

## 2021-09-19 DIAGNOSIS — S81802A Unspecified open wound, left lower leg, initial encounter: Secondary | ICD-10-CM | POA: Diagnosis not present

## 2021-09-19 DIAGNOSIS — E66813 Obesity, class 3: Secondary | ICD-10-CM | POA: Diagnosis present

## 2021-09-19 DIAGNOSIS — S81802D Unspecified open wound, left lower leg, subsequent encounter: Secondary | ICD-10-CM | POA: Diagnosis not present

## 2021-09-19 DIAGNOSIS — Z1624 Resistance to multiple antibiotics: Secondary | ICD-10-CM | POA: Diagnosis not present

## 2021-09-19 DIAGNOSIS — I700 Atherosclerosis of aorta: Secondary | ICD-10-CM | POA: Diagnosis present

## 2021-09-19 DIAGNOSIS — M1612 Unilateral primary osteoarthritis, left hip: Secondary | ICD-10-CM | POA: Diagnosis not present

## 2021-09-19 DIAGNOSIS — I2510 Atherosclerotic heart disease of native coronary artery without angina pectoris: Secondary | ICD-10-CM | POA: Diagnosis present

## 2021-09-19 DIAGNOSIS — I110 Hypertensive heart disease with heart failure: Secondary | ICD-10-CM | POA: Diagnosis present

## 2021-09-19 DIAGNOSIS — A4189 Other specified sepsis: Secondary | ICD-10-CM | POA: Diagnosis not present

## 2021-09-19 DIAGNOSIS — G4733 Obstructive sleep apnea (adult) (pediatric): Secondary | ICD-10-CM | POA: Diagnosis present

## 2021-09-19 DIAGNOSIS — J9811 Atelectasis: Secondary | ICD-10-CM | POA: Diagnosis not present

## 2021-09-19 DIAGNOSIS — R310 Gross hematuria: Secondary | ICD-10-CM | POA: Diagnosis not present

## 2021-09-19 DIAGNOSIS — S81812D Laceration without foreign body, left lower leg, subsequent encounter: Secondary | ICD-10-CM | POA: Diagnosis not present

## 2021-09-19 DIAGNOSIS — R0602 Shortness of breath: Secondary | ICD-10-CM | POA: Diagnosis not present

## 2021-09-19 DIAGNOSIS — Z23 Encounter for immunization: Secondary | ICD-10-CM | POA: Diagnosis not present

## 2021-09-19 LAB — BLOOD GAS, VENOUS
Acid-base deficit: 1.1 mmol/L (ref 0.0–2.0)
Bicarbonate: 26.4 mmol/L (ref 20.0–28.0)
O2 Saturation: 58.7 %
Patient temperature: 37
pCO2, Ven: 55 mmHg (ref 44–60)
pH, Ven: 7.29 (ref 7.25–7.43)
pO2, Ven: 39 mmHg (ref 32–45)

## 2021-09-19 LAB — FIBRINOGEN: Fibrinogen: 437 mg/dL (ref 210–475)

## 2021-09-19 LAB — TYPE AND SCREEN
ABO/RH(D): A POS
ABO/RH(D): A POS
Antibody Screen: NEGATIVE
Antibody Screen: NEGATIVE

## 2021-09-19 LAB — CBC
HCT: 34 % — ABNORMAL LOW (ref 39.0–52.0)
Hemoglobin: 10.7 g/dL — ABNORMAL LOW (ref 13.0–17.0)
MCH: 31.8 pg (ref 26.0–34.0)
MCHC: 31.5 g/dL (ref 30.0–36.0)
MCV: 100.9 fL — ABNORMAL HIGH (ref 80.0–100.0)
Platelets: 116 10*3/uL — ABNORMAL LOW (ref 150–400)
RBC: 3.37 MIL/uL — ABNORMAL LOW (ref 4.22–5.81)
RDW: 14.2 % (ref 11.5–15.5)
WBC: 15.7 10*3/uL — ABNORMAL HIGH (ref 4.0–10.5)
nRBC: 0 % (ref 0.0–0.2)

## 2021-09-19 LAB — GLUCOSE, CAPILLARY
Glucose-Capillary: 104 mg/dL — ABNORMAL HIGH (ref 70–99)
Glucose-Capillary: 111 mg/dL — ABNORMAL HIGH (ref 70–99)
Glucose-Capillary: 34 mg/dL — CL (ref 70–99)
Glucose-Capillary: 59 mg/dL — ABNORMAL LOW (ref 70–99)
Glucose-Capillary: 60 mg/dL — ABNORMAL LOW (ref 70–99)
Glucose-Capillary: 94 mg/dL (ref 70–99)
Glucose-Capillary: 94 mg/dL (ref 70–99)
Glucose-Capillary: 97 mg/dL (ref 70–99)
Glucose-Capillary: 98 mg/dL (ref 70–99)
Glucose-Capillary: 98 mg/dL (ref 70–99)

## 2021-09-19 LAB — CBC WITH DIFFERENTIAL/PLATELET
Abs Immature Granulocytes: 0.11 10*3/uL — ABNORMAL HIGH (ref 0.00–0.07)
Basophils Absolute: 0 10*3/uL (ref 0.0–0.1)
Basophils Relative: 0 %
Eosinophils Absolute: 0.1 10*3/uL (ref 0.0–0.5)
Eosinophils Relative: 1 %
HCT: 30.5 % — ABNORMAL LOW (ref 39.0–52.0)
Hemoglobin: 9.9 g/dL — ABNORMAL LOW (ref 13.0–17.0)
Immature Granulocytes: 1 %
Lymphocytes Relative: 8 %
Lymphs Abs: 0.8 10*3/uL (ref 0.7–4.0)
MCH: 32.5 pg (ref 26.0–34.0)
MCHC: 32.5 g/dL (ref 30.0–36.0)
MCV: 100 fL (ref 80.0–100.0)
Monocytes Absolute: 1.3 10*3/uL — ABNORMAL HIGH (ref 0.1–1.0)
Monocytes Relative: 11 %
Neutro Abs: 8.8 10*3/uL — ABNORMAL HIGH (ref 1.7–7.7)
Neutrophils Relative %: 79 %
Platelets: 118 10*3/uL — ABNORMAL LOW (ref 150–400)
RBC: 3.05 MIL/uL — ABNORMAL LOW (ref 4.22–5.81)
RDW: 14.2 % (ref 11.5–15.5)
Smear Review: NORMAL
WBC: 11.1 10*3/uL — ABNORMAL HIGH (ref 4.0–10.5)
nRBC: 0 % (ref 0.0–0.2)

## 2021-09-19 LAB — COMPREHENSIVE METABOLIC PANEL
ALT: 30 U/L (ref 0–44)
AST: 59 U/L — ABNORMAL HIGH (ref 15–41)
Albumin: 2.6 g/dL — ABNORMAL LOW (ref 3.5–5.0)
Alkaline Phosphatase: 138 U/L — ABNORMAL HIGH (ref 38–126)
Anion gap: 6 (ref 5–15)
BUN: 36 mg/dL — ABNORMAL HIGH (ref 8–23)
CO2: 25 mmol/L (ref 22–32)
Calcium: 8 mg/dL — ABNORMAL LOW (ref 8.9–10.3)
Chloride: 107 mmol/L (ref 98–111)
Creatinine, Ser: 1.47 mg/dL — ABNORMAL HIGH (ref 0.61–1.24)
GFR, Estimated: 53 mL/min — ABNORMAL LOW (ref >60)
Glucose, Bld: 69 mg/dL — ABNORMAL LOW (ref 70–99)
Potassium: 4 mmol/L (ref 3.5–5.1)
Sodium: 138 mmol/L (ref 135–145)
Total Bilirubin: 1.8 mg/dL — ABNORMAL HIGH (ref 0.3–1.2)
Total Protein: 5.5 g/dL — ABNORMAL LOW (ref 6.5–8.1)

## 2021-09-19 LAB — BRAIN NATRIURETIC PEPTIDE: B Natriuretic Peptide: 138.9 pg/mL — ABNORMAL HIGH (ref 0.0–100.0)

## 2021-09-19 LAB — PROTIME-INR
INR: 2 — ABNORMAL HIGH (ref 0.8–1.2)
Prothrombin Time: 22.8 seconds — ABNORMAL HIGH (ref 11.4–15.2)

## 2021-09-19 LAB — SEDIMENTATION RATE: Sed Rate: 2 mm/hr (ref 0–20)

## 2021-09-19 LAB — HIV ANTIBODY (ROUTINE TESTING W REFLEX): HIV Screen 4th Generation wRfx: NONREACTIVE

## 2021-09-19 LAB — C-REACTIVE PROTEIN: CRP: 14.3 mg/dL — ABNORMAL HIGH (ref <1.0)

## 2021-09-19 LAB — CK: Total CK: 762 U/L — ABNORMAL HIGH (ref 49–397)

## 2021-09-19 LAB — AMMONIA: Ammonia: 31 umol/L (ref 9–35)

## 2021-09-19 LAB — VITAMIN B12: Vitamin B-12: 312 pg/mL (ref 180–914)

## 2021-09-19 IMAGING — DX DG CHEST 1V PORT
1 series · 1 of 1 positions shown · non-contrast
Comparison: CT [DATE] and chest radiograph [DATE]

CLINICAL DATA: Shortness of breath.

EXAM:
PORTABLE CHEST 1 VIEW

[chest ap]
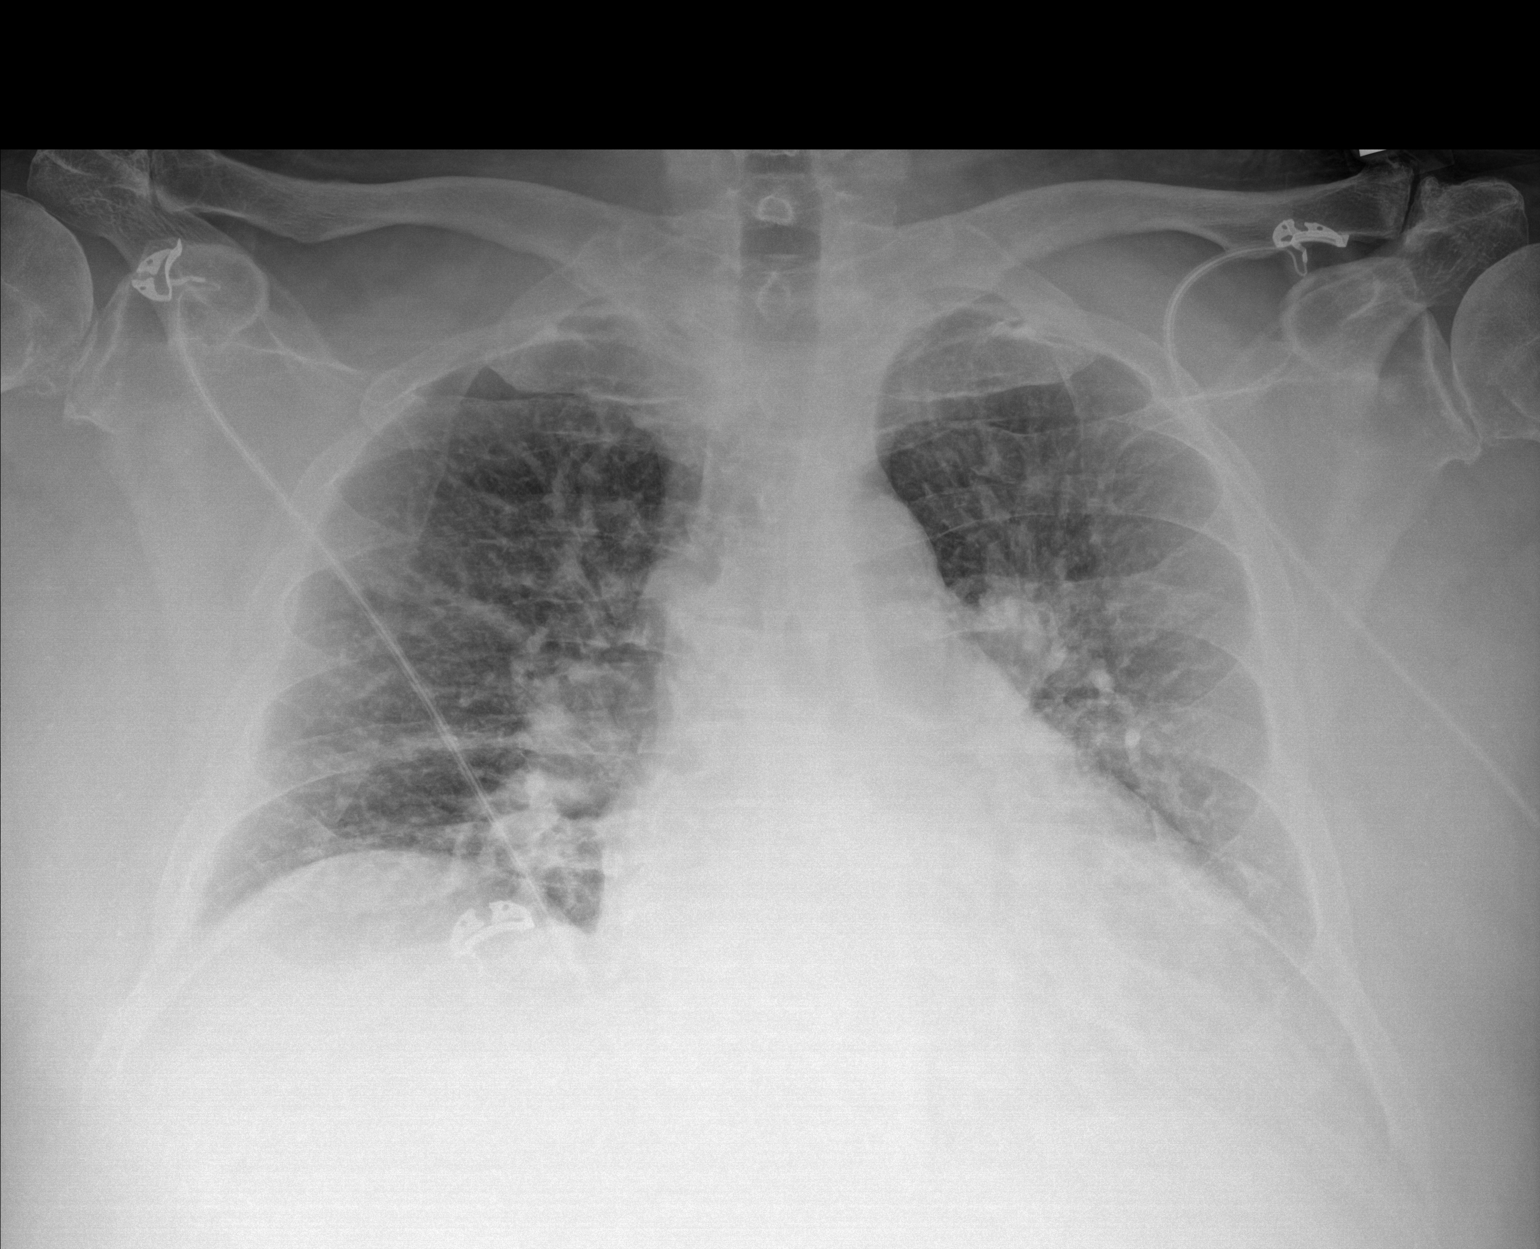

[1 of 1 positions shown; findings below may reference images not displayed]

FINDINGS: Borderline cardiac enlargement is accentuated by technique. Aortic
atherosclerosis. Low lung volumes with dependent subsegmental
atelectasis. No visible pleural effusion or pneumothorax. No acute
osseous abnormality.
IMPRESSION: Low lung volumes with dependent subsegmental atelectasis.

Borderline cardiac enlargement accentuated by technique.
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
## 2021-09-19 IMAGING — CT CT TIBIA FIBULA *L* W/O CM
3 of 6 series · 8 of 36 positions shown, 9 images · non-contrast
Comparison: Radiographs [DATE]

CLINICAL DATA: History of lower extremity laceration now with pain
and swelling.

EXAM:
CT OF THE LOWER LEFT EXTREMITY WITHOUT CONTRAST
TECHNIQUE: Multidetector CT imaging of the lower left extremity was performed
according to the standard protocol.
RADIATION DOSE REDUCTION: This exam was performed according to the
departmental dose-optimization program which includes automated
exposure control, adjustment of the mA and/or kV according to
patient size and/or use of iterative reconstruction technique.

[Series 4: axial bone · axial · 0.52mm/px · z∈[+410,+557]mm · 2 of 295 slices shown, 3 images]
[im 99/295  soft-tissue]
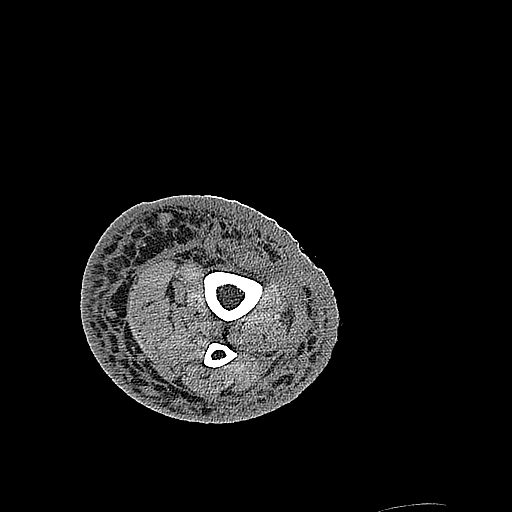
[im 99/295  bone]
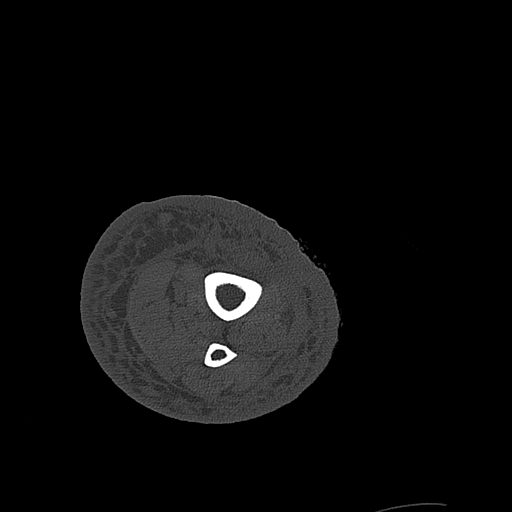
[im 197/295  bone]
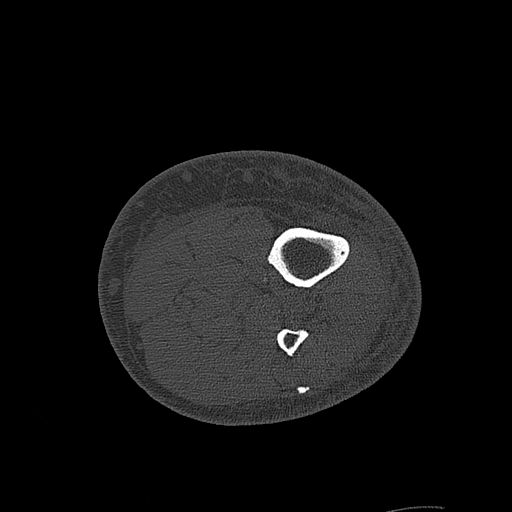

[Series 6: coronal bone · coronal · 0.43mm/px · 1 of 110 slices shown]
[im 55/110  bone]
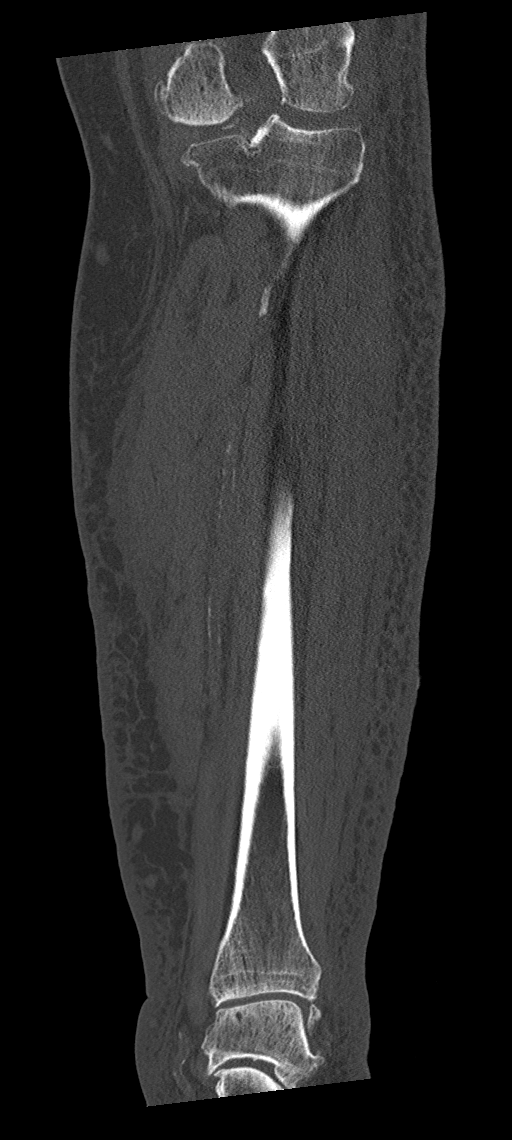

[Series 8: sagittal bone · sagittal · 0.34mm/px · 5 of 110 slices shown]
[im 19/110  bone]
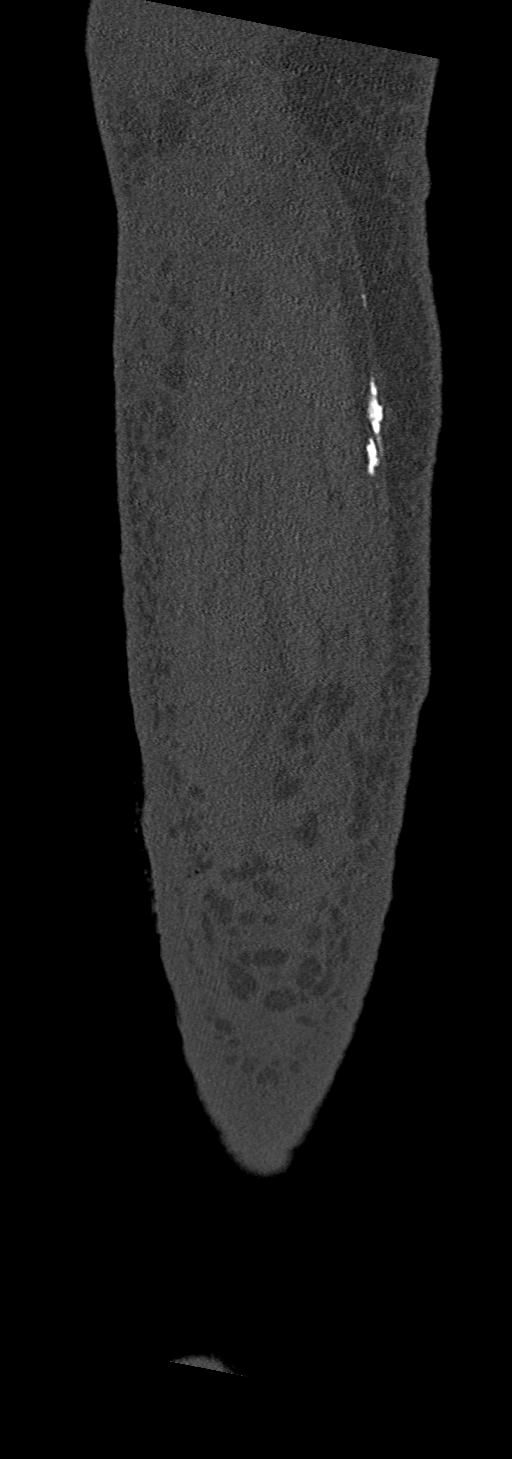
[im 37/110  bone]
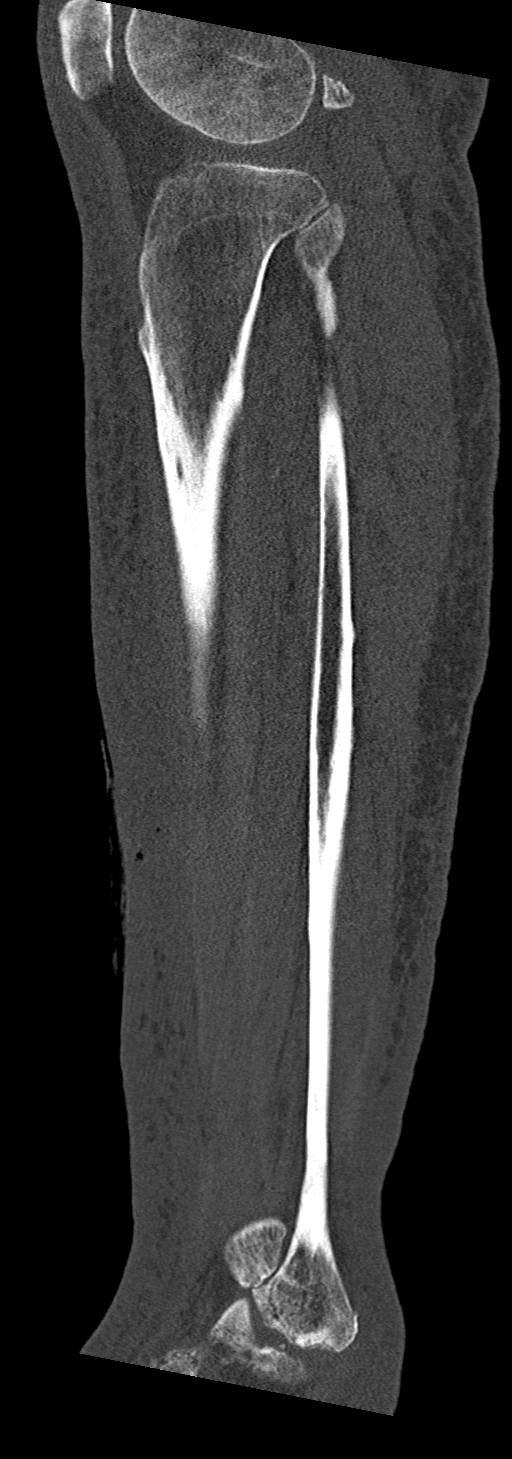
[im 55/110  bone]
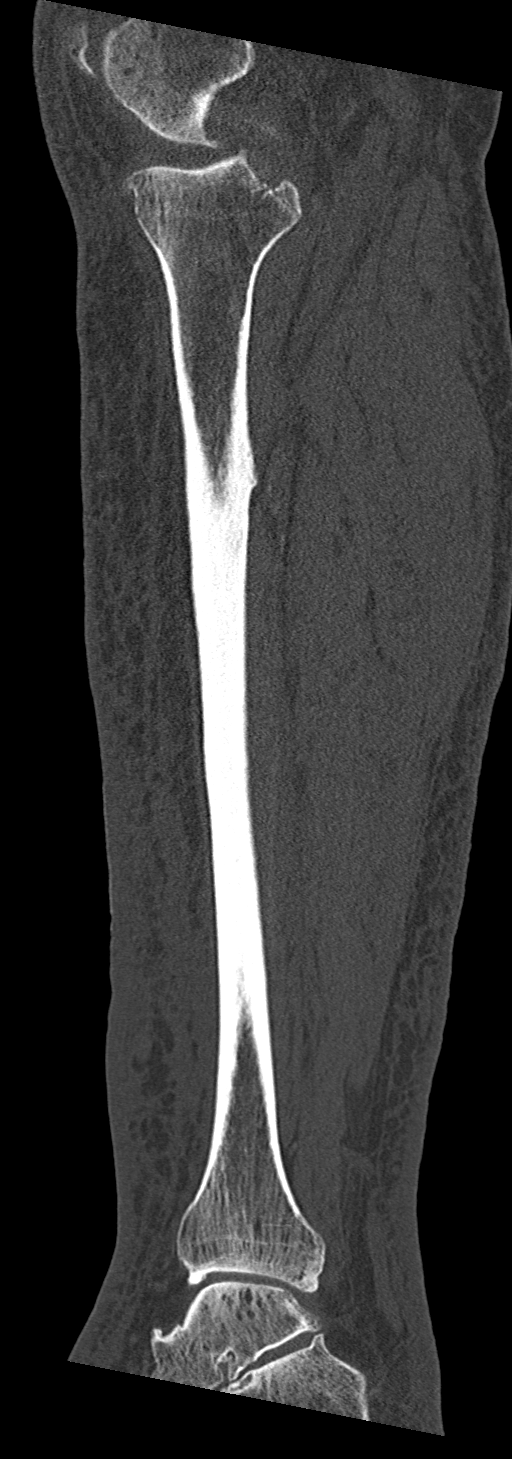
[im 73/110  bone]
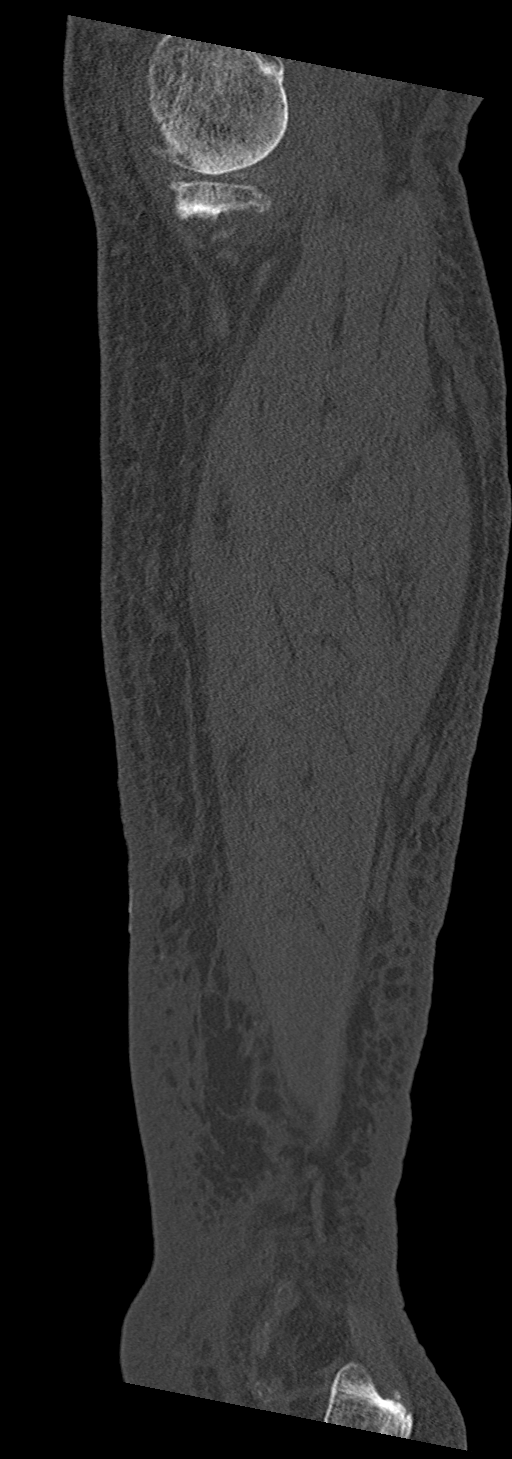
[im 91/110  bone]
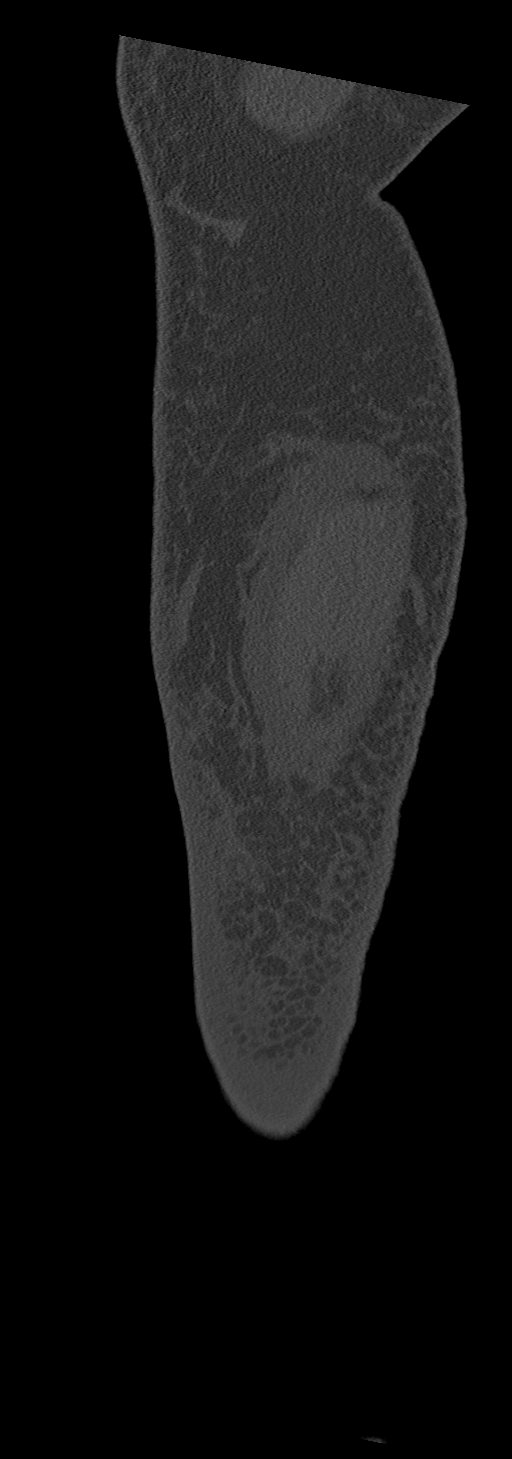

[8 of 36 positions shown; findings below may reference images not displayed]

FINDINGS: Diffuse and marked subcutaneous soft tissue swelling/edema and skin
thickening all consistent with extensive cellulitis. There is
evidence of an open wound along the anterior and medial aspect of
the mid tibia. There is gas and some fluid in the wound. I do not
see a discrete drainable soft tissue abscess.

Associated myofasciitis involving the anterior tibialis muscle but
no evidence of pyomyositis. The posterior calf musculature is
unremarkable.

The tibia and fibula are intact. No findings suspicious for fracture
or osteomyelitis. There are degenerative changes at the knee and
ankle joints but no findings suspicious for septic arthritis. Fairly
extensive vascular calcifications likely related to diabetes.
IMPRESSION: 1. Diffuse and marked subcutaneous soft tissue swelling/edema and
skin thickening all consistent with extensive cellulitis.
2. Evidence of an open wound along the anterior and medial aspect of
the mid tibia. There is gas and some fluid in the wound. I do not
see a discrete drainable soft tissue abscess.
3. Associated myofasciitis involving the anterior tibialis muscle
but no evidence of pyomyositis.
4. No CT findings suspicious for septic arthritis or osteomyelitis.

## 2021-09-19 MED ORDER — ACETAMINOPHEN 325 MG PO TABS
650.0000 mg | ORAL_TABLET | Freq: Four times a day (QID) | ORAL | Status: DC | PRN
  Administered 2021-09-21: 650 mg via ORAL
  Filled 2021-09-19: qty 2

## 2021-09-19 MED ORDER — DEXTROSE 50 % IV SOLN
12.5000 g | INTRAVENOUS | Status: AC
  Administered 2021-09-19: 12.5 g via INTRAVENOUS

## 2021-09-19 MED ORDER — DEXTROSE 50 % IV SOLN
12.5000 g | Freq: Once | INTRAVENOUS | Status: AC
  Administered 2021-09-19: 12.5 g via INTRAVENOUS

## 2021-09-19 MED ORDER — VANCOMYCIN HCL 1750 MG/350ML IV SOLN
1750.0000 mg | INTRAVENOUS | Status: DC
  Filled 2021-09-19: qty 350

## 2021-09-19 MED ORDER — HYDROCODONE-ACETAMINOPHEN 5-325 MG PO TABS
1.0000 | ORAL_TABLET | Freq: Four times a day (QID) | ORAL | Status: DC | PRN
  Administered 2021-09-19: 1 via ORAL
  Filled 2021-09-19: qty 1

## 2021-09-19 MED ORDER — DEXTROSE 50 % IV SOLN
INTRAVENOUS | Status: AC
  Administered 2021-09-19: 50 mL
  Filled 2021-09-19: qty 50

## 2021-09-19 MED ORDER — HYDROMORPHONE HCL 1 MG/ML IJ SOLN
0.5000 mg | INTRAMUSCULAR | Status: DC | PRN
  Administered 2021-09-19 – 2021-09-20 (×5): 0.5 mg via INTRAVENOUS
  Filled 2021-09-19 (×5): qty 0.5

## 2021-09-19 MED ORDER — LACTATED RINGERS IV SOLN
INTRAVENOUS | Status: DC
Start: 2021-09-19 — End: 2021-09-19

## 2021-09-19 MED ORDER — DEXTROSE 10 % IV SOLN: INTRAVENOUS | Status: DC

## 2021-09-19 MED ORDER — DEXTROSE 50 % IV SOLN
INTRAVENOUS | Status: AC
  Filled 2021-09-19: qty 50

## 2021-09-19 MED ORDER — METHOCARBAMOL 1000 MG/10ML IJ SOLN
500.0000 mg | Freq: Four times a day (QID) | INTRAMUSCULAR | Status: DC | PRN
Start: 2021-09-19 — End: 2021-09-19

## 2021-09-19 MED ORDER — HYDROCODONE-ACETAMINOPHEN 5-325 MG PO TABS: 1.0000 | ORAL_TABLET | Freq: Four times a day (QID) | ORAL | Status: DC | PRN

## 2021-09-19 MED ORDER — ONDANSETRON HCL 4 MG/2ML IJ SOLN: 4.0000 mg | Freq: Four times a day (QID) | INTRAMUSCULAR | Status: DC | PRN

## 2021-09-19 MED ORDER — VANCOMYCIN HCL 2000 MG/400ML IV SOLN
2000.0000 mg | Freq: Once | INTRAVENOUS | Status: AC
  Administered 2021-09-19: 2000 mg via INTRAVENOUS
  Filled 2021-09-19: qty 400

## 2021-09-19 MED ORDER — METHOCARBAMOL 500 MG PO TABS
500.0000 mg | ORAL_TABLET | Freq: Three times a day (TID) | ORAL | Status: DC
Start: 2021-09-19 — End: 2021-09-20
  Administered 2021-09-19 – 2021-09-20 (×3): 500 mg via ORAL
  Filled 2021-09-19 (×3): qty 1

## 2021-09-19 MED ORDER — SENNOSIDES-DOCUSATE SODIUM 8.6-50 MG PO TABS
1.0000 | ORAL_TABLET | Freq: Every evening | ORAL | Status: DC | PRN
  Administered 2021-09-20: 1 via ORAL
  Filled 2021-09-19: qty 1

## 2021-09-19 MED ORDER — VANCOMYCIN HCL 1750 MG/350ML IV SOLN
1750.0000 mg | INTRAVENOUS | Status: DC
  Administered 2021-09-20: 1750 mg via INTRAVENOUS
  Filled 2021-09-19 (×2): qty 350

## 2021-09-19 MED ORDER — DEXTROSE-NACL 10-0.45 % IV SOLN
INTRAVENOUS | Status: DC
  Filled 2021-09-19 (×5): qty 1000

## 2021-09-19 MED ORDER — BISACODYL 10 MG RE SUPP: 10.0000 mg | Freq: Every day | RECTAL | Status: DC | PRN

## 2021-09-19 MED ORDER — SODIUM CHLORIDE 0.9 % IV SOLN
1.0000 g | INTRAVENOUS | Status: DC
  Administered 2021-09-19 – 2021-09-21 (×3): 1 g via INTRAVENOUS
  Filled 2021-09-19 (×3): qty 10

## 2021-09-19 MED ORDER — DEXTROSE 50 % IV SOLN
25.0000 g | INTRAVENOUS | Status: DC
  Filled 2021-09-19: qty 50

## 2021-09-19 MED ORDER — ACETAMINOPHEN 650 MG RE SUPP: 650.0000 mg | Freq: Four times a day (QID) | RECTAL | Status: DC | PRN

## 2021-09-19 NOTE — Assessment & Plan Note (Addendum)
No evidence of acute hematuria. Appreciate urology consultation. There is a renal cyst.  No further work-up recommended per urology

## 2021-09-19 NOTE — Progress Notes (Signed)
Pt blood sugar is 34. Pt drowsy, easily arousable then goes back to sleep. Pt on NPO. Followed hypoglycemia protocol. Dextrose 25g given. Will recheck BS after 15 mins.

## 2021-09-19 NOTE — Assessment & Plan Note (Deleted)
    Latest Ref Rng & Units 09/18/2021    5:45 PM 09/17/2021    8:57 AM 07/05/2021    3:36 PM  CBC  WBC 4.0 - 10.5 K/uL 15.2   4.2   3.7    Hemoglobin 13.0 - 17.0 g/dL 11.5   12.1   12.8    Hematocrit 39.0 - 52.0 % 35.2   37.8   38.7    Platelets 150 - 400 K/uL 139   153   133    hb stable on comparison. we will follow.

## 2021-09-19 NOTE — Assessment & Plan Note (Addendum)
Bilateral pedal edema.  Left more than right. Appears to have volume overload.  Recently noted to have weeping from his left leg requiring Lasix regimen on outpatient basis. Echocardiogram 6/1 EF 60 to 65% with normal diastolic parameters without any valvular abnormality or wall motion abnormality. We will initiate Lasix on 6/2.  Hold fluids on 6/1.

## 2021-09-19 NOTE — Assessment & Plan Note (Addendum)
Patient reported severe pain on left hip. Given that the x-ray was negative, CT hip was performed which was reassuring and negative for any acute fracture. Due to ongoing pain in the left hip area we will get MRI of the hip to rule out any occult fracture.

## 2021-09-19 NOTE — Assessment & Plan Note (Addendum)
Possible OSA. Body mass index is 41.11 kg/m.  Recommend outpatient sleep apnea work-up. We will continue overnight oxygen.

## 2021-09-19 NOTE — Assessment & Plan Note (Addendum)
Nocturnal hypoxia. Possibility of a pickwickian syndrome. We will continue with incentive spirometry. Chest x-ray shows no evidence of pneumonia or edema.  No significant exam findings as well. On room air during the daytime.  Overnight oximetry shows hypoxia to the degree of 80s.  Will benefit from overnight 2 L of oxygen.

## 2021-09-19 NOTE — Assessment & Plan Note (Signed)
Holding her oral hypoglycemic agents.  Monitor.

## 2021-09-19 NOTE — Assessment & Plan Note (Addendum)
Baseline hemoglobin around 13. On admission hemoglobin 11.5.  Currently running down to 9.9. The drop in hemoglobin on admission is most likely from blood loss that patient sustained at home prior to coming to the hospital.  Monitor. Mild B12 deficiency.

## 2021-09-19 NOTE — Progress Notes (Signed)
Hypoglycemic Event  CBG: 60  Treatment: D50 25 mL (12.5 gm)  Symptoms:  drowsy  Follow-up CBG: EEFE:0712  CBG Result:90   Possible Reasons for Event: Other: NPO  Comments/MD notified:Followed hypoglycemia protocol as ordered. Provider informed    Ribay-pagdatoon, Audrea Muscat
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
## 2021-09-19 NOTE — Assessment & Plan Note (Signed)
MRI abdomen attempted in the ER but due to motion visualization not a good study. Repeat MRI abdomen with and without contrast in 4 to 6 weeks recommended.

## 2021-09-19 NOTE — Assessment & Plan Note (Addendum)
MRSA related wound infection. Nonhealing wound for 2 weeks. Meeting SIRS criteria on admission with tachycardia and leukocytosis.  Has low-grade temp, hypotension and evidence of cellulitis with acute organ damage with AKI. CT shows evidence of gas in the wound without any drainable soft tissue abscess and myofasciitis without pyomyositis. No evidence of osteomyelitis. Received a tetanus shot on 5/29. currently being aggressively treated with IV vancomycin and ceftriaxone. Highly appreciate general surgery assistance in managing his complicated wounds. Continue wet to dry dressings, xeroform to denuded skin, ACE wrap.  Ok to ambulate/weight bear as tolerated.  Continue left leg elevation as much as possible. Leg swelling improving.  Redness resolving.  WBC improving as well. Blood cultures negative for 2 days. Continue oral antibiotics. It appears that the wound dressing changes are challenging for the patient and I agree with general surgery that outpatient wound management will be difficult until patient's pain is under control.  Attempt to use p.o. medication and monitor.

## 2021-09-19 NOTE — Assessment & Plan Note (Deleted)
Blood pressure (!) 143/97, pulse (!) 108, temperature 98.8 F (37.1 C), temperature source Oral, resp. rate 16, SpO2 95 %. PRN hydralazine with lower parameters to treat BP.

## 2021-09-19 NOTE — Assessment & Plan Note (Addendum)
Primary reason for patient's presentation to the ER. Currently bleeding has stopped. Appreciate general surgery consultation.  Continue dressing changes. Resume anticoagulation.

## 2021-09-19 NOTE — Plan of Care (Signed)
  Problem: Education: Goal: Knowledge of General Education information will improve Description: Including pain rating scale, medication(s)/side effects and non-pharmacologic comfort measures Outcome: Progressing   Problem: Clinical Measurements: Goal: Respiratory complications will improve Outcome: Progressing   Problem: Clinical Measurements: Goal: Cardiovascular complication will be avoided Outcome: Progressing   Problem: Activity: Goal: Risk for activity intolerance will decrease Outcome: Progressing   Problem: Nutrition: Goal: Adequate nutrition will be maintained Outcome: Progressing   Problem: Coping: Goal: Level of anxiety will decrease Outcome: Progressing   Problem: Elimination: Goal: Will not experience complications related to urinary retention Outcome: Progressing   Problem: Pain Managment: Goal: General experience of comfort will improve Outcome: Progressing   Problem: Skin Integrity: Goal: Risk for impaired skin integrity will decrease Outcome: Progressing   Problem: Safety: Goal: Ability to remain free from injury will improve Outcome: Progressing

## 2021-09-19 NOTE — Assessment & Plan Note (Signed)
Multiple chronic compression fractures identified on his recent CT scan. No further work-up for now.  Outpatient work-up recommended

## 2021-09-19 NOTE — Assessment & Plan Note (Deleted)
Lab Results  Component Value Date   CREATININE 1.95 (H) 09/18/2021   CREATININE 0.80 09/17/2021   CREATININE 0.81 09/13/2021  Due to prerenal and renal causes.  No obstruction on imaging. Gentle hydration. Hold home med lisinopril. Avoid contrast studies.

## 2021-09-19 NOTE — Progress Notes (Signed)
*  PRELIMINARY RESULTS* Echocardiogram 2D Echocardiogram has been performed.  Tim Hodge 09/19/2021, 3:12 PM
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
## 2021-09-19 NOTE — Consult Note (Signed)
Pharmacy Antibiotic Note  Tim Hodge is a 66 y.o. male admitted on 09/18/2021 with  gross hematuria and leg laceration .  Pharmacy has been consulted for Vancomycin dosing.  Plan: Vancomycin 2g IV x 1 as loading dose, followed by 1750 mg Q 24 hours. Goal AUC 400-550. Expected AUC: 493.9, Calculated Cssmin: 11.9 SCr used: 1.47, Vd used: 0.5   Height: 6' 0.99" (185.4 cm) Weight: (!) 141.6 kg (312 lb 2.7 oz) IBW/kg (Calculated) : 79.88  Temp (24hrs), Avg:98.8 F (37.1 C), Min:98.4 F (36.9 C), Max:99.1 F (37.3 C)  Recent Labs  Lab 09/13/21 0935 09/17/21 0857 09/18/21 1745 09/19/21 0401 09/19/21 1118  WBC  --  4.2 15.2* 15.7* 11.1*  CREATININE 0.81 0.80 1.95* 1.47*  --     Estimated Creatinine Clearance: 74.1 mL/min (A) (by C-G formula based on SCr of 1.47 mg/dL (H)).    Allergies  Allergen Reactions   Iodinated Contrast Media Shortness Of Breath and Rash   Flexeril [Cyclobenzaprine]     Intolerant- sedation.     Iodine    Tramadol Other (See Comments)    Ineffective for pain.    Trulicity [Dulaglutide]     GI upset   Vioxx [Rofecoxib] Other (See Comments)    LFT elevation   Metformin And Related Other (See Comments)    Muscle cramps   Valium [Diazepam] Rash    Rash    Antimicrobials this admission: Vanc 5/31 >>  Rocephine 5/31 >>   Dose adjustments this admission: N/a  Microbiology results: 5/31 BCx: pending  Thank you for allowing pharmacy to be a part of this patient's care.  Hollie Bartus A Forrester Blando 09/19/2021 2:45 PM
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
## 2021-09-19 NOTE — Assessment & Plan Note (Deleted)
Pt has gi appt with Dr.Russo for his liver and pancreas abnormality.

## 2021-09-19 NOTE — ED Notes (Signed)
Patient very drowsy. Opens eyes to verbal stimuli and follows commands and answers questions but falls back asleep shortly after interacting with staff. Patient continues to have intermittent episodes of apnea with oxygen saturation decreasing to 86% briefly then coming back to 95-99% on 4L per [place]. Dr. Posey Pronto notified.

## 2021-09-19 NOTE — Assessment & Plan Note (Signed)
From poor p.o. intake. Also patient is on oral hypoglycemic agent which might be lasting longer due to his AKI. Patient was receiving D10 infusion at 125 cc/h. Now off of the fluid. Add Ensure.

## 2021-09-19 NOTE — Progress Notes (Signed)
       CROSS COVER NOTE  NAME: Tim Hodge MRN: [id number] DOB : 1955-08-18    Date of Service   09/19/2021  HPI/Events of Note   Hypoglycemic event reported at 52. CBG 34  Interventions   Plan: D10 @ 50 mL/hr POCT q4H        Neomia Glass DNP, MHA, FNP-BC Nurse Practitioner [facility] Pager [phone]
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
## 2021-09-19 NOTE — Assessment & Plan Note (Signed)
Baseline serum creatinine 0.8. On admission serum creatinine 1.95. CK elevated less than 1000. Suspect this is heme induced injury. Patient currently responding to IV antibiotics. Avoid nephrotoxic medication Renal function now normal. Monitor while receiving diuretics.

## 2021-09-19 NOTE — Assessment & Plan Note (Addendum)
Liver lesion. Pancreatic cyst. 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 Would recommend outpatient work-up for these condition once the patient is medically stable rather than inpatient work-up for now. AFP sent.
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
## 2021-09-19 NOTE — Assessment & Plan Note (Deleted)
We will keep pt npo. Glycemic protocol q6h.

## 2021-09-19 NOTE — Telephone Encounter (Signed)
Will await ER/inpatient report.  Thanks.

## 2021-09-19 NOTE — Assessment & Plan Note (Addendum)
Pt has h/o 2 pe in his lung Resume Xarelto

## 2021-09-19 NOTE — Assessment & Plan Note (Deleted)
Cont with gentl IVF hydration.

## 2021-09-19 NOTE — Assessment & Plan Note (Signed)
Blood pressure stable.  Monitor. 

## 2021-09-19 NOTE — Assessment & Plan Note (Signed)
Likely in the setting of cirrhosis. Platelet count stable for now. No evidence of schistocytes on the smear.  Monitor.

## 2021-09-19 NOTE — ED Notes (Signed)
Dr. Cheri Fowler notified of increased oxygen demand and that patient with slightly increased somnolence after ativan and episodes of hypoxia with oxygen saturation down to 86% on 2L per [place]. Patient now on 4L per [place].

## 2021-09-20 DIAGNOSIS — S81812A Laceration without foreign body, left lower leg, initial encounter: Secondary | ICD-10-CM | POA: Diagnosis not present

## 2021-09-20 DIAGNOSIS — I10 Essential (primary) hypertension: Secondary | ICD-10-CM

## 2021-09-20 DIAGNOSIS — R600 Localized edema: Secondary | ICD-10-CM | POA: Diagnosis not present

## 2021-09-20 LAB — CBC
HCT: 31.3 % — ABNORMAL LOW (ref 39.0–52.0)
Hemoglobin: 10.1 g/dL — ABNORMAL LOW (ref 13.0–17.0)
MCH: 32.6 pg (ref 26.0–34.0)
MCHC: 32.3 g/dL (ref 30.0–36.0)
MCV: 101 fL — ABNORMAL HIGH (ref 80.0–100.0)
Platelets: 127 10*3/uL — ABNORMAL LOW (ref 150–400)
RBC: 3.1 MIL/uL — ABNORMAL LOW (ref 4.22–5.81)
RDW: 13.8 % (ref 11.5–15.5)
WBC: 12 10*3/uL — ABNORMAL HIGH (ref 4.0–10.5)
nRBC: 0 % (ref 0.0–0.2)

## 2021-09-20 LAB — BASIC METABOLIC PANEL
Anion gap: 2 — ABNORMAL LOW (ref 5–15)
BUN: 26 mg/dL — ABNORMAL HIGH (ref 8–23)
CO2: 26 mmol/L (ref 22–32)
Calcium: 7.8 mg/dL — ABNORMAL LOW (ref 8.9–10.3)
Chloride: 109 mmol/L (ref 98–111)
Creatinine, Ser: 1.09 mg/dL (ref 0.61–1.24)
GFR, Estimated: >60 mL/min (ref >60)
Glucose, Bld: 163 mg/dL — ABNORMAL HIGH (ref 70–99)
Potassium: 3.9 mmol/L (ref 3.5–5.1)
Sodium: 137 mmol/L (ref 135–145)

## 2021-09-20 LAB — ECHOCARDIOGRAM COMPLETE
AR max vel: 2.91 cm2
AV Area VTI: 2.87 cm2
AV Area mean vel: 2.74 cm2
AV Mean grad: 8 mmHg
AV Peak grad: 13.7 mmHg
Ao pk vel: 1.85 m/s
Area-P 1/2: 2.76 cm2
Height: 72.992 in
MV VTI: 2.28 cm2
S' Lateral: 3.63 cm
Weight: 4994.74 oz

## 2021-09-20 LAB — COMPREHENSIVE METABOLIC PANEL
ALT: 29 U/L (ref 0–44)
AST: 60 U/L — ABNORMAL HIGH (ref 15–41)
Albumin: 2.4 g/dL — ABNORMAL LOW (ref 3.5–5.0)
Alkaline Phosphatase: 124 U/L (ref 38–126)
Anion gap: 3 — ABNORMAL LOW (ref 5–15)
BUN: 26 mg/dL — ABNORMAL HIGH (ref 8–23)
CO2: 26 mmol/L (ref 22–32)
Calcium: 7.8 mg/dL — ABNORMAL LOW (ref 8.9–10.3)
Chloride: 109 mmol/L (ref 98–111)
Creatinine, Ser: 1.1 mg/dL (ref 0.61–1.24)
GFR, Estimated: >60 mL/min (ref >60)
Glucose, Bld: 120 mg/dL — ABNORMAL HIGH (ref 70–99)
Potassium: 4 mmol/L (ref 3.5–5.1)
Sodium: 138 mmol/L (ref 135–145)
Total Bilirubin: 1.8 mg/dL — ABNORMAL HIGH (ref 0.3–1.2)
Total Protein: 5.3 g/dL — ABNORMAL LOW (ref 6.5–8.1)

## 2021-09-20 LAB — GLUCOSE, CAPILLARY
Glucose-Capillary: 120 mg/dL — ABNORMAL HIGH (ref 70–99)
Glucose-Capillary: 118 mg/dL — ABNORMAL HIGH (ref 70–99)
Glucose-Capillary: 178 mg/dL — ABNORMAL HIGH (ref 70–99)
Glucose-Capillary: 194 mg/dL — ABNORMAL HIGH (ref 70–99)
Glucose-Capillary: 141 mg/dL — ABNORMAL HIGH (ref 70–99)

## 2021-09-20 LAB — MAGNESIUM
Magnesium: 2 mg/dL (ref 1.7–2.4)
Magnesium: 2 mg/dL (ref 1.7–2.4)

## 2021-09-20 LAB — CK: Total CK: 465 U/L — ABNORMAL HIGH (ref 49–397)

## 2021-09-20 LAB — AFP TUMOR MARKER: AFP, Serum, Tumor Marker: <1.8 ng/mL (ref 0.0–8.4)

## 2021-09-20 MED ORDER — SALINE SPRAY 0.65 % NA SOLN: 1.0000 | NASAL | Status: DC | PRN

## 2021-09-20 MED ORDER — METHOCARBAMOL 500 MG PO TABS
500.0000 mg | ORAL_TABLET | Freq: Three times a day (TID) | ORAL | Status: DC | PRN
Start: 2021-09-20 — End: 2021-09-25
  Administered 2021-09-21 – 2021-09-23 (×4): 500 mg via ORAL
  Filled 2021-09-20 (×4): qty 1

## 2021-09-20 MED ORDER — OXYCODONE HCL 5 MG PO TABS
5.0000 mg | ORAL_TABLET | ORAL | Status: DC | PRN
  Administered 2021-09-20 – 2021-09-21 (×3): 10 mg via ORAL
  Administered 2021-09-21: 5 mg via ORAL
  Administered 2021-09-22 – 2021-09-24 (×5): 10 mg via ORAL
  Filled 2021-09-20 (×5): qty 2
  Filled 2021-09-20: qty 1
  Filled 2021-09-20 (×3): qty 2
  Filled 2021-09-20: qty 1
  Filled 2021-09-20: qty 2

## 2021-09-20 MED ORDER — FUROSEMIDE 10 MG/ML IJ SOLN
40.0000 mg | Freq: Every day | INTRAMUSCULAR | Status: DC
  Administered 2021-09-21 – 2021-09-22 (×2): 40 mg via INTRAVENOUS
  Filled 2021-09-20 (×2): qty 4

## 2021-09-20 MED ORDER — MORPHINE SULFATE (PF) 2 MG/ML IV SOLN
2.0000 mg | INTRAVENOUS | Status: DC | PRN
  Administered 2021-09-21 – 2021-09-22 (×2): 2 mg via INTRAVENOUS
  Filled 2021-09-20 (×2): qty 1

## 2021-09-20 MED ORDER — OXYCODONE HCL 5 MG PO TABS
5.0000 mg | ORAL_TABLET | ORAL | Status: DC | PRN
  Administered 2021-09-20: 5 mg via ORAL
  Filled 2021-09-20: qty 1

## 2021-09-20 MED ORDER — FLUTICASONE PROPIONATE 50 MCG/ACT NA SUSP
2.0000 | Freq: Every day | NASAL | Status: DC
  Administered 2021-09-20 – 2021-09-25 (×6): 2 via NASAL
  Filled 2021-09-20: qty 16

## 2021-09-20 NOTE — Plan of Care (Signed)
  Problem: Education: Goal: Knowledge of General Education information will improve Description: Including pain rating scale, medication(s)/side effects and non-pharmacologic comfort measures Outcome: Progressing   Problem: Clinical Measurements: Goal: Cardiovascular complication will be avoided Outcome: Progressing   Problem: Clinical Measurements: Goal: Respiratory complications will improve Outcome: Progressing   Problem: Nutrition: Goal: Adequate nutrition will be maintained Outcome: Progressing   Problem: Coping: Goal: Level of anxiety will decrease Outcome: Progressing   Problem: Elimination: Goal: Will not experience complications related to bowel motility Outcome: Progressing Goal: Will not experience complications related to urinary retention Outcome: Progressing   Problem: Pain Managment: Goal: General experience of comfort will improve Outcome: Progressing   Problem: Safety: Goal: Ability to remain free from injury will improve Outcome: Progressing   Problem: Skin Integrity: Goal: Risk for impaired skin integrity will decrease Outcome: Progressing

## 2021-09-20 NOTE — Progress Notes (Signed)
PT refused NIV for HS. NARD/SOB.

## 2021-09-20 NOTE — Progress Notes (Signed)
CPAP placed on pt @ 0000 with 4L O2. Pt kept taking CPAP mask off stating "I can't breathe", multiple readjustments and reassurances that pt was getting enough oxygen. Pt educated on how CPAP technology works. Pt took off mask and refused to use for rest off the night after only wearing it for 1 hr. Pt placed back on 4LNC. Pt pulled out IV. New IV placed in Left upper arm. Pt continuously pulls EKG leads off of chest. Pt is Aox4. Pt sleeping with his mouth open, encouraged to drink fluids frequently. Pt c/o discomfort in LLE. Pt educated often on importance of keeping leg elevated to reduce swelling. Pt continuously repositions himself in bed, taking his legs off of the pillows and trying to hang leg over side of bed. Pt refuses SCD's due to severe discomfort in legs.
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
## 2021-09-20 NOTE — Care Management Important Message (Signed)
Important Message  Patient Details  Name: Tim Hodge MRN: [id number] Date of Birth: October 04, 1955   Medicare Important Message Given:  N/A - LOS <3 / Initial given by admissions     Dannette Barbara 09/20/2021, 2:46 PM
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
## 2021-09-21 DIAGNOSIS — S81812A Laceration without foreign body, left lower leg, initial encounter: Secondary | ICD-10-CM | POA: Diagnosis not present

## 2021-09-21 DIAGNOSIS — R600 Localized edema: Secondary | ICD-10-CM | POA: Diagnosis not present

## 2021-09-21 LAB — GLUCOSE, CAPILLARY
Glucose-Capillary: 107 mg/dL — ABNORMAL HIGH (ref 70–99)
Glucose-Capillary: 113 mg/dL — ABNORMAL HIGH (ref 70–99)
Glucose-Capillary: 122 mg/dL — ABNORMAL HIGH (ref 70–99)
Glucose-Capillary: 122 mg/dL — ABNORMAL HIGH (ref 70–99)
Glucose-Capillary: 145 mg/dL — ABNORMAL HIGH (ref 70–99)
Glucose-Capillary: 163 mg/dL — ABNORMAL HIGH (ref 70–99)
Glucose-Capillary: 173 mg/dL — ABNORMAL HIGH (ref 70–99)

## 2021-09-21 LAB — CBC
HCT: 30.4 % — ABNORMAL LOW (ref 39.0–52.0)
Hemoglobin: 10 g/dL — ABNORMAL LOW (ref 13.0–17.0)
MCH: 33.1 pg (ref 26.0–34.0)
MCHC: 32.9 g/dL (ref 30.0–36.0)
MCV: 100.7 fL — ABNORMAL HIGH (ref 80.0–100.0)
Platelets: 130 10*3/uL — ABNORMAL LOW (ref 150–400)
RBC: 3.02 MIL/uL — ABNORMAL LOW (ref 4.22–5.81)
RDW: 13.6 % (ref 11.5–15.5)
WBC: 9.3 10*3/uL (ref 4.0–10.5)
nRBC: 0 % (ref 0.0–0.2)

## 2021-09-21 LAB — CK: Total CK: 205 U/L (ref 49–397)

## 2021-09-21 LAB — COMPREHENSIVE METABOLIC PANEL
ALT: 29 U/L (ref 0–44)
AST: 45 U/L — ABNORMAL HIGH (ref 15–41)
Albumin: 2.3 g/dL — ABNORMAL LOW (ref 3.5–5.0)
Alkaline Phosphatase: 116 U/L (ref 38–126)
Anion gap: 5 (ref 5–15)
BUN: 25 mg/dL — ABNORMAL HIGH (ref 8–23)
CO2: 26 mmol/L (ref 22–32)
Calcium: 8.1 mg/dL — ABNORMAL LOW (ref 8.9–10.3)
Chloride: 110 mmol/L (ref 98–111)
Creatinine, Ser: 0.86 mg/dL (ref 0.61–1.24)
GFR, Estimated: >60 mL/min (ref >60)
Glucose, Bld: 121 mg/dL — ABNORMAL HIGH (ref 70–99)
Potassium: 3.9 mmol/L (ref 3.5–5.1)
Sodium: 141 mmol/L (ref 135–145)
Total Bilirubin: 2.4 mg/dL — ABNORMAL HIGH (ref 0.3–1.2)
Total Protein: 5.5 g/dL — ABNORMAL LOW (ref 6.5–8.1)

## 2021-09-21 LAB — MAGNESIUM: Magnesium: 2.2 mg/dL (ref 1.7–2.4)

## 2021-09-21 MED ORDER — DOXYCYCLINE HYCLATE 100 MG PO TABS
100.0000 mg | ORAL_TABLET | Freq: Two times a day (BID) | ORAL | Status: DC
  Administered 2021-09-21 – 2021-09-25 (×9): 100 mg via ORAL
  Filled 2021-09-21 (×9): qty 1

## 2021-09-21 MED ORDER — VANCOMYCIN HCL 1500 MG/300ML IV SOLN
1500.0000 mg | Freq: Two times a day (BID) | INTRAVENOUS | Status: DC
  Filled 2021-09-21: qty 300

## 2021-09-21 MED ORDER — CEFDINIR 300 MG PO CAPS
300.0000 mg | ORAL_CAPSULE | Freq: Two times a day (BID) | ORAL | Status: DC
  Administered 2021-09-22 – 2021-09-25 (×7): 300 mg via ORAL
  Filled 2021-09-21 (×7): qty 1

## 2021-09-21 MED ORDER — ENOXAPARIN SODIUM 80 MG/0.8ML IJ SOSY
0.5000 mg/kg | PREFILLED_SYRINGE | INTRAMUSCULAR | Status: DC
  Administered 2021-09-21 – 2021-09-24 (×4): 72.5 mg via SUBCUTANEOUS
  Filled 2021-09-21 (×4): qty 0.72

## 2021-09-21 MED ORDER — ENOXAPARIN SODIUM 40 MG/0.4ML IJ SOSY
40.0000 mg | PREFILLED_SYRINGE | INTRAMUSCULAR | Status: DC
Start: 2021-09-21 — End: 2021-09-21

## 2021-09-21 MED ORDER — ENSURE ENLIVE PO LIQD
237.0000 mL | Freq: Two times a day (BID) | ORAL | Status: DC
Start: 2021-09-21 — End: 2021-09-25
  Administered 2021-09-21 – 2021-09-25 (×8): 237 mL via ORAL

## 2021-09-21 NOTE — Progress Notes (Signed)
PT REFUSED NIV/CPAP. NARD/SOB.

## 2021-09-21 NOTE — Progress Notes (Signed)
PT placed on pulse Ox for overnight sleep study, on room air. SpO2 95%, HR 68, RR 16 b/m.
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
## 2021-09-22 DIAGNOSIS — S81802D Unspecified open wound, left lower leg, subsequent encounter: Secondary | ICD-10-CM

## 2021-09-22 DIAGNOSIS — L03116 Cellulitis of left lower limb: Secondary | ICD-10-CM | POA: Diagnosis not present

## 2021-09-22 DIAGNOSIS — Z1624 Resistance to multiple antibiotics: Secondary | ICD-10-CM

## 2021-09-22 DIAGNOSIS — S81812A Laceration without foreign body, left lower leg, initial encounter: Secondary | ICD-10-CM | POA: Diagnosis not present

## 2021-09-22 DIAGNOSIS — B9562 Methicillin resistant Staphylococcus aureus infection as the cause of diseases classified elsewhere: Secondary | ICD-10-CM

## 2021-09-22 LAB — CBC
HCT: 30.8 % — ABNORMAL LOW (ref 39.0–52.0)
Hemoglobin: 10 g/dL — ABNORMAL LOW (ref 13.0–17.0)
MCH: 31.8 pg (ref 26.0–34.0)
MCHC: 32.5 g/dL (ref 30.0–36.0)
MCV: 98.1 fL (ref 80.0–100.0)
Platelets: 142 10*3/uL — ABNORMAL LOW (ref 150–400)
RBC: 3.14 MIL/uL — ABNORMAL LOW (ref 4.22–5.81)
RDW: 13.4 % (ref 11.5–15.5)
WBC: 6.9 10*3/uL (ref 4.0–10.5)
nRBC: 0 % (ref 0.0–0.2)

## 2021-09-22 LAB — COMPREHENSIVE METABOLIC PANEL
ALT: 27 U/L (ref 0–44)
AST: 41 U/L (ref 15–41)
Albumin: 2.3 g/dL — ABNORMAL LOW (ref 3.5–5.0)
Alkaline Phosphatase: 136 U/L — ABNORMAL HIGH (ref 38–126)
Anion gap: 6 (ref 5–15)
BUN: 24 mg/dL — ABNORMAL HIGH (ref 8–23)
CO2: 28 mmol/L (ref 22–32)
Calcium: 8.3 mg/dL — ABNORMAL LOW (ref 8.9–10.3)
Chloride: 108 mmol/L (ref 98–111)
Creatinine, Ser: 0.82 mg/dL (ref 0.61–1.24)
GFR, Estimated: >60 mL/min (ref >60)
Glucose, Bld: 109 mg/dL — ABNORMAL HIGH (ref 70–99)
Potassium: 3.5 mmol/L (ref 3.5–5.1)
Sodium: 142 mmol/L (ref 135–145)
Total Bilirubin: 1.9 mg/dL — ABNORMAL HIGH (ref 0.3–1.2)
Total Protein: 5.4 g/dL — ABNORMAL LOW (ref 6.5–8.1)

## 2021-09-22 LAB — GLUCOSE, CAPILLARY
Glucose-Capillary: 112 mg/dL — ABNORMAL HIGH (ref 70–99)
Glucose-Capillary: 117 mg/dL — ABNORMAL HIGH (ref 70–99)
Glucose-Capillary: 174 mg/dL — ABNORMAL HIGH (ref 70–99)
Glucose-Capillary: 117 mg/dL — ABNORMAL HIGH (ref 70–99)
Glucose-Capillary: 137 mg/dL — ABNORMAL HIGH (ref 70–99)
Glucose-Capillary: 146 mg/dL — ABNORMAL HIGH (ref 70–99)

## 2021-09-22 LAB — MAGNESIUM: Magnesium: 2 mg/dL (ref 1.7–2.4)

## 2021-09-22 MED ORDER — INSULIN ASPART 100 UNIT/ML IJ SOLN
0.0000 [IU] | Freq: Three times a day (TID) | INTRAMUSCULAR | Status: DC
  Administered 2021-09-23 – 2021-09-24 (×3): 1 [IU] via SUBCUTANEOUS
  Administered 2021-09-25: 2 [IU] via SUBCUTANEOUS
  Filled 2021-09-22 (×4): qty 1

## 2021-09-22 MED ORDER — PANTOPRAZOLE SODIUM 40 MG PO TBEC
40.0000 mg | DELAYED_RELEASE_TABLET | Freq: Two times a day (BID) | ORAL | Status: DC
  Administered 2021-09-22 – 2021-09-25 (×7): 40 mg via ORAL
  Filled 2021-09-22 (×7): qty 1

## 2021-09-22 MED ORDER — INSULIN ASPART 100 UNIT/ML IJ SOLN: 0.0000 [IU] | Freq: Every day | INTRAMUSCULAR | Status: DC

## 2021-09-22 MED ORDER — MORPHINE SULFATE (PF) 4 MG/ML IV SOLN
4.0000 mg | Freq: Two times a day (BID) | INTRAVENOUS | Status: DC | PRN
  Administered 2021-09-23 – 2021-09-24 (×2): 4 mg via INTRAVENOUS
  Filled 2021-09-22 (×2): qty 1

## 2021-09-22 NOTE — Progress Notes (Signed)
Mobility Specialist - Progress Note   09/22/21 1137  Mobility  Activity Transferred to/from BSC;Stood at bedside;Dangled on edge of bed  Level of Assistance Minimal assist, patient does 75% or more  Assistive Device Front wheel walker  Distance Ambulated (ft) 3 ft  Activity Response Tolerated well  $Mobility charge 1 Mobility    Pt supine upon arrival using RA with family at bedside. Pt completes bed mobility MinA with assist with LLE d/t pain. Pt dangles EOB indep voicing 7/10 pain. Completes STS MinA from elevated bed level -- one standing take 3 slow gaited steps to complete B-BSC transfer. Pt returns stands ~ 1 minute for pericare and returns to bed with needs in reach and family at bedside.  Merrily Brittle Mobility Specialist 09/22/21, 12:03 PM
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
## 2021-09-22 NOTE — Progress Notes (Signed)
PT taken off Overnight Pulse Ox at this time, 0510 on room air.
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
## 2021-09-22 NOTE — Progress Notes (Signed)
PT Cancellation Note  Patient Details Name: Tim Hodge MRN: [id number] DOB: 02-11-1956   Cancelled Treatment:    Reason Eval/Treat Not Completed: Other (comment).  Therapist chart reviewed and attempted to see pt.  Upon arrival to the room, pt was requesting pain medication and requested for therapist to come back at later time.  Will attempt to see pt again at later date/time as medically appropriate.     Gwenlyn Saran, PT, DPT 09/22/21, 1:13 PM

## 2021-09-22 NOTE — Progress Notes (Signed)
PHARMACIST - PHYSICIAN COMMUNICATION  CONCERNING: IV to Oral Route Change Policy  RECOMMENDATION: This patient is receiving pantoprazole by the intravenous route.  Based on criteria approved by the Pharmacy and Therapeutics Committee, the intravenous medication(s) is/are being converted to the equivalent oral dose form(s).   DESCRIPTION: These criteria include: The patient is eating (either orally or via tube) and/or has been taking other orally administered medications for a least 24 hours The patient has no evidence of active gastrointestinal bleeding or impaired GI absorption (gastrectomy, short bowel, patient on TNA or NPO).  If you have questions about this conversion, please contact the [place], [facility] 09/22/2021 8:31 AM

## 2021-09-23 DIAGNOSIS — L03116 Cellulitis of left lower limb: Secondary | ICD-10-CM | POA: Diagnosis not present

## 2021-09-23 DIAGNOSIS — S81812A Laceration without foreign body, left lower leg, initial encounter: Secondary | ICD-10-CM | POA: Diagnosis not present

## 2021-09-23 DIAGNOSIS — Z1624 Resistance to multiple antibiotics: Secondary | ICD-10-CM | POA: Diagnosis not present

## 2021-09-23 DIAGNOSIS — B9562 Methicillin resistant Staphylococcus aureus infection as the cause of diseases classified elsewhere: Secondary | ICD-10-CM | POA: Diagnosis not present

## 2021-09-23 LAB — GLUCOSE, CAPILLARY
Glucose-Capillary: 109 mg/dL — ABNORMAL HIGH (ref 70–99)
Glucose-Capillary: 109 mg/dL — ABNORMAL HIGH (ref 70–99)
Glucose-Capillary: 138 mg/dL — ABNORMAL HIGH (ref 70–99)
Glucose-Capillary: 145 mg/dL — ABNORMAL HIGH (ref 70–99)
Glucose-Capillary: 162 mg/dL — ABNORMAL HIGH (ref 70–99)
Glucose-Capillary: 193 mg/dL — ABNORMAL HIGH (ref 70–99)

## 2021-09-23 LAB — CBC WITH DIFFERENTIAL/PLATELET
Abs Immature Granulocytes: 0.46 10*3/uL — ABNORMAL HIGH (ref 0.00–0.07)
Basophils Absolute: 0.1 10*3/uL (ref 0.0–0.1)
Basophils Relative: 2 %
Eosinophils Absolute: 0.4 10*3/uL (ref 0.0–0.5)
Eosinophils Relative: 6 %
HCT: 30.8 % — ABNORMAL LOW (ref 39.0–52.0)
Hemoglobin: 10 g/dL — ABNORMAL LOW (ref 13.0–17.0)
Immature Granulocytes: 6 %
Lymphocytes Relative: 21 %
Lymphs Abs: 1.6 10*3/uL (ref 0.7–4.0)
MCH: 32.2 pg (ref 26.0–34.0)
MCHC: 32.5 g/dL (ref 30.0–36.0)
MCV: 99 fL (ref 80.0–100.0)
Monocytes Absolute: 1.1 10*3/uL — ABNORMAL HIGH (ref 0.1–1.0)
Monocytes Relative: 15 %
Neutro Abs: 3.8 10*3/uL (ref 1.7–7.7)
Neutrophils Relative %: 50 %
Platelets: 189 10*3/uL (ref 150–400)
RBC: 3.11 MIL/uL — ABNORMAL LOW (ref 4.22–5.81)
RDW: 13.4 % (ref 11.5–15.5)
Smear Review: NORMAL
WBC: 7.5 10*3/uL (ref 4.0–10.5)
nRBC: 0 % (ref 0.0–0.2)

## 2021-09-23 LAB — BASIC METABOLIC PANEL
Anion gap: 3 — ABNORMAL LOW (ref 5–15)
BUN: 23 mg/dL (ref 8–23)
CO2: 30 mmol/L (ref 22–32)
Calcium: 8 mg/dL — ABNORMAL LOW (ref 8.9–10.3)
Chloride: 109 mmol/L (ref 98–111)
Creatinine, Ser: 0.98 mg/dL (ref 0.61–1.24)
GFR, Estimated: >60 mL/min (ref >60)
Glucose, Bld: 113 mg/dL — ABNORMAL HIGH (ref 70–99)
Potassium: 3.4 mmol/L — ABNORMAL LOW (ref 3.5–5.1)
Sodium: 142 mmol/L (ref 135–145)

## 2021-09-23 LAB — MAGNESIUM: Magnesium: 2.1 mg/dL (ref 1.7–2.4)

## 2021-09-23 MED ORDER — FUROSEMIDE 10 MG/ML IJ SOLN
60.0000 mg | Freq: Every day | INTRAMUSCULAR | Status: DC
  Administered 2021-09-24: 60 mg via INTRAVENOUS
  Filled 2021-09-23 (×2): qty 8

## 2021-09-23 MED ORDER — POTASSIUM CHLORIDE CRYS ER 20 MEQ PO TBCR
20.0000 meq | EXTENDED_RELEASE_TABLET | Freq: Two times a day (BID) | ORAL | Status: DC
  Administered 2021-09-23 – 2021-09-25 (×5): 20 meq via ORAL
  Filled 2021-09-23 (×5): qty 1

## 2021-09-23 MED ORDER — FUROSEMIDE 10 MG/ML IJ SOLN
60.0000 mg | Freq: Two times a day (BID) | INTRAMUSCULAR | Status: DC
  Administered 2021-09-23: 60 mg via INTRAVENOUS
  Filled 2021-09-23: qty 8
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
## 2021-09-23 NOTE — Plan of Care (Signed)
  Problem: Education: Goal: Knowledge of General Education information will improve Description: Including pain rating scale, medication(s)/side effects and non-pharmacologic comfort measures Outcome: Progressing   Problem: Health Behavior/Discharge Planning: Goal: Ability to manage health-related needs will improve Outcome: Progressing   Problem: Clinical Measurements: Goal: Ability to maintain clinical measurements within normal limits will improve Outcome: Progressing Goal: Will remain free from infection Outcome: Progressing Goal: Diagnostic test results will improve Outcome: Progressing Goal: Respiratory complications will improve Outcome: Progressing Goal: Cardiovascular complication will be avoided Outcome: Progressing   Problem: Nutrition: Goal: Adequate nutrition will be maintained Outcome: Progressing   Problem: Coping: Goal: Level of anxiety will decrease Outcome: Progressing   Problem: Elimination: Goal: Will not experience complications related to bowel motility Outcome: Progressing Goal: Will not experience complications related to urinary retention Outcome: Progressing   Problem: Pain Managment: Goal: General experience of comfort will improve Outcome: Progressing   Problem: Safety: Goal: Ability to remain free from injury will improve Outcome: Progressing   Problem: Skin Integrity: Goal: Risk for impaired skin integrity will decrease Outcome: Progressing   Problem: Education: Goal: Ability to describe self-care measures that may prevent or decrease complications (Diabetes Survival Skills Education) will improve Outcome: Progressing Goal: Individualized Educational Video(s) Outcome: Progressing   Problem: Coping: Goal: Ability to adjust to condition or change in health will improve Outcome: Progressing   Problem: Fluid Volume: Goal: Ability to maintain a balanced intake and output will improve Outcome: Progressing   Problem: Health  Behavior/Discharge Planning: Goal: Ability to identify and utilize available resources and services will improve Outcome: Progressing Goal: Ability to manage health-related needs will improve Outcome: Progressing   Problem: Metabolic: Goal: Ability to maintain appropriate glucose levels will improve Outcome: Progressing   Problem: Skin Integrity: Goal: Risk for impaired skin integrity will decrease Outcome: Progressing   Problem: Tissue Perfusion: Goal: Adequacy of tissue perfusion will improve Outcome: Progressing

## 2021-09-24 ENCOUNTER — Inpatient Hospital Stay: Payer: Medicare Other

## 2021-09-24 DIAGNOSIS — B9562 Methicillin resistant Staphylococcus aureus infection as the cause of diseases classified elsewhere: Secondary | ICD-10-CM | POA: Diagnosis not present

## 2021-09-24 DIAGNOSIS — S81812A Laceration without foreign body, left lower leg, initial encounter: Secondary | ICD-10-CM | POA: Diagnosis not present

## 2021-09-24 DIAGNOSIS — Z1624 Resistance to multiple antibiotics: Secondary | ICD-10-CM | POA: Diagnosis not present

## 2021-09-24 DIAGNOSIS — S81812D Laceration without foreign body, left lower leg, subsequent encounter: Secondary | ICD-10-CM

## 2021-09-24 DIAGNOSIS — L03116 Cellulitis of left lower limb: Secondary | ICD-10-CM | POA: Diagnosis not present

## 2021-09-24 LAB — AEROBIC/ANAEROBIC CULTURE W GRAM STAIN (SURGICAL/DEEP WOUND): Gram Stain: NONE SEEN

## 2021-09-24 LAB — BASIC METABOLIC PANEL
Anion gap: 5 (ref 5–15)
BUN: 21 mg/dL (ref 8–23)
CO2: 29 mmol/L (ref 22–32)
Calcium: 8.3 mg/dL — ABNORMAL LOW (ref 8.9–10.3)
Chloride: 109 mmol/L (ref 98–111)
Creatinine, Ser: 0.96 mg/dL (ref 0.61–1.24)
GFR, Estimated: >60 mL/min (ref >60)
Glucose, Bld: 140 mg/dL — ABNORMAL HIGH (ref 70–99)
Potassium: 4 mmol/L (ref 3.5–5.1)
Sodium: 143 mmol/L (ref 135–145)

## 2021-09-24 LAB — GLUCOSE, CAPILLARY
Glucose-Capillary: 149 mg/dL — ABNORMAL HIGH (ref 70–99)
Glucose-Capillary: 199 mg/dL — ABNORMAL HIGH (ref 70–99)
Glucose-Capillary: 122 mg/dL — ABNORMAL HIGH (ref 70–99)
Glucose-Capillary: 185 mg/dL — ABNORMAL HIGH (ref 70–99)

## 2021-09-24 LAB — MAGNESIUM: Magnesium: 2.1 mg/dL (ref 1.7–2.4)

## 2021-09-24 LAB — CULTURE, BLOOD (ROUTINE X 2)
Culture: NO GROWTH
Culture: NO GROWTH
Special Requests: ADEQUATE
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
## 2021-09-24 MED ORDER — OXYCODONE HCL 5 MG PO TABS
5.0000 mg | ORAL_TABLET | ORAL | Status: DC | PRN
  Administered 2021-09-24: 5 mg via ORAL
  Filled 2021-09-24: qty 1

## 2021-09-24 MED ORDER — MORPHINE SULFATE (PF) 2 MG/ML IV SOLN: 2.0000 mg | INTRAVENOUS | Status: DC | PRN

## 2021-09-24 MED ORDER — OXYCODONE HCL 5 MG PO TABS
10.0000 mg | ORAL_TABLET | Freq: Two times a day (BID) | ORAL | Status: DC | PRN
  Administered 2021-09-25: 10 mg via ORAL
  Filled 2021-09-24: qty 2

## 2021-09-24 MED ORDER — RIVAROXABAN 20 MG PO TABS
20.0000 mg | ORAL_TABLET | Freq: Every day | ORAL | Status: DC
  Filled 2021-09-24: qty 1

## 2021-09-24 MED ORDER — HYDROCORTISONE 0.5 % EX CREA
TOPICAL_CREAM | Freq: Two times a day (BID) | CUTANEOUS | Status: DC
Start: 2021-09-24 — End: 2021-09-25
  Filled 2021-09-24: qty 28.35

## 2021-09-24 MED ORDER — HYDROXYZINE HCL 25 MG PO TABS
25.0000 mg | ORAL_TABLET | Freq: Three times a day (TID) | ORAL | Status: DC | PRN
Start: 2021-09-24 — End: 2021-09-25
  Administered 2021-09-24: 25 mg via ORAL
  Filled 2021-09-24: qty 1

## 2021-09-24 MED ORDER — LORAZEPAM 2 MG/ML IJ SOLN
0.5000 mg | Freq: Once | INTRAMUSCULAR | Status: AC | PRN
  Administered 2021-09-24: 0.5 mg via INTRAVENOUS
  Filled 2021-09-24: qty 1
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
## 2021-09-24 NOTE — Plan of Care (Signed)
  Problem: Clinical Measurements: Goal: Ability to maintain clinical measurements within normal limits will improve Outcome: Progressing   Problem: Activity: Goal: Risk for activity intolerance will decrease Outcome: Progressing   Problem: Coping: Goal: Level of anxiety will decrease Outcome: Progressing   Problem: Elimination: Goal: Will not experience complications related to bowel motility Outcome: Progressing Goal: Will not experience complications related to urinary retention Outcome: Progressing   Problem: Pain Managment: Goal: General experience of comfort will improve Outcome: Progressing
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
## 2021-09-24 NOTE — Care Management Important Message (Signed)
Important Message  Patient Details  Name: Tim Hodge MRN: [id number] Date of Birth: Mar 01, 1956   Medicare Important Message Given:  Yes     Dannette Barbara 09/24/2021, 10:47 AM
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
## 2021-09-25 ENCOUNTER — Other Ambulatory Visit: Payer: Self-pay | Admitting: Internal Medicine

## 2021-09-25 DIAGNOSIS — B9562 Methicillin resistant Staphylococcus aureus infection as the cause of diseases classified elsewhere: Secondary | ICD-10-CM | POA: Diagnosis not present

## 2021-09-25 DIAGNOSIS — S81812A Laceration without foreign body, left lower leg, initial encounter: Secondary | ICD-10-CM | POA: Diagnosis not present

## 2021-09-25 DIAGNOSIS — Z1624 Resistance to multiple antibiotics: Secondary | ICD-10-CM | POA: Diagnosis not present

## 2021-09-25 DIAGNOSIS — G4734 Idiopathic sleep related nonobstructive alveolar hypoventilation: Secondary | ICD-10-CM

## 2021-09-25 LAB — MAGNESIUM: Magnesium: 2 mg/dL (ref 1.7–2.4)

## 2021-09-25 LAB — GLUCOSE, CAPILLARY
Glucose-Capillary: 141 mg/dL — ABNORMAL HIGH (ref 70–99)
Glucose-Capillary: 242 mg/dL — ABNORMAL HIGH (ref 70–99)

## 2021-09-25 LAB — BASIC METABOLIC PANEL
Anion gap: 5 (ref 5–15)
BUN: 21 mg/dL (ref 8–23)
CO2: 27 mmol/L (ref 22–32)
Calcium: 8.5 mg/dL — ABNORMAL LOW (ref 8.9–10.3)
Chloride: 108 mmol/L (ref 98–111)
Creatinine, Ser: 0.91 mg/dL (ref 0.61–1.24)
GFR, Estimated: >60 mL/min (ref >60)
Glucose, Bld: 170 mg/dL — ABNORMAL HIGH (ref 70–99)
Potassium: 4.3 mmol/L (ref 3.5–5.1)
Sodium: 140 mmol/L (ref 135–145)

## 2021-09-25 MED ORDER — OXYCODONE-ACETAMINOPHEN 10-325 MG PO TABS: 1.0000 | ORAL_TABLET | Freq: Four times a day (QID) | ORAL | 0 refills | Status: DC | PRN

## 2021-09-25 MED ORDER — SACCHAROMYCES BOULARDII 250 MG PO CAPS: 250.0000 mg | ORAL_CAPSULE | Freq: Two times a day (BID) | ORAL | 0 refills | Status: AC

## 2021-09-25 MED ORDER — CEFDINIR 300 MG PO CAPS: 300.0000 mg | ORAL_CAPSULE | Freq: Two times a day (BID) | ORAL | 0 refills | Status: AC

## 2021-09-25 MED ORDER — DOXYCYCLINE HYCLATE 100 MG PO TABS: 100.0000 mg | ORAL_TABLET | Freq: Two times a day (BID) | ORAL | 0 refills | Status: AC

## 2021-09-25 MED ORDER — POTASSIUM CHLORIDE CRYS ER 20 MEQ PO TBCR: 20.0000 meq | EXTENDED_RELEASE_TABLET | Freq: Every day | ORAL | 0 refills | Status: DC

## 2021-09-25 MED ORDER — FUROSEMIDE 40 MG PO TABS: ORAL_TABLET | ORAL | 0 refills | Status: DC

## 2021-09-25 MED ORDER — LINAGLIPTIN 5 MG PO TABS: 5.0000 mg | ORAL_TABLET | Freq: Every day | ORAL | 0 refills | Status: DC

## 2021-09-25 MED ORDER — PANTOPRAZOLE SODIUM 40 MG PO TBEC: 40.0000 mg | DELAYED_RELEASE_TABLET | Freq: Every day | ORAL | 0 refills | Status: DC

## 2021-09-25 MED ORDER — METHOCARBAMOL 500 MG PO TABS: 500.0000 mg | ORAL_TABLET | Freq: Three times a day (TID) | ORAL | 0 refills | Status: DC | PRN

## 2021-09-25 MED ORDER — FLUTICASONE PROPIONATE 50 MCG/ACT NA SUSP: 2.0000 | Freq: Every day | NASAL | 0 refills | Status: DC

## 2021-09-25 NOTE — Progress Notes (Signed)
Mobility Specialist - Progress Note   09/25/21 1029  Mobility  Activity Ambulated with assistance in room;Ambulated with assistance to bathroom;Stood at bedside;Dangled on edge of bed;Transferred to/from [facility]  Level of Assistance Minimal assist, patient does 75% or more  Assistive Device Front wheel walker  LLE Weight Bearing WBAT  Distance Ambulated (ft) 15 ft  Activity Response Tolerated well  $Mobility charge 1 Mobility     Pre-mobility: 86 HR, 96% SpO2 During mobility: 106- 110 HR, 94-95% SpO2   Pt supine upon arrival using RA. Able to donn socks in figure four position on both legs ModI. Completes bed mobility ModI using hand rails for assist -- mild SOB while dangling EOB. Completes STS with MinA and ambulates with SBA to bathroom with WBAT on LE, descends to toilet MinGuard --- voices only mild pain this date. Completes STS MinA and ambulates back to bed SUPERVISION (no hands on). Able to lift LLE for pillow placement indep. Pt is left with needs in reach, family at bedside, RN notified.  Merrily Brittle Mobility Specialist 09/25/21, 10:36 AM

## 2021-09-25 NOTE — TOC Transition Note (Signed)
Transition of Care [facility]) - CM/SW Discharge Note   Patient Details  Name: Tim Hodge MRN: [id number] Date of Birth: 11/26/1955  Transition of Care [facility]) CM/SW Contact:  Candie Chroman, LCSW Phone Number: 09/25/2021, 11:49 AM   Clinical Narrative:  Patient has orders to discharge home today. [facility] representative is aware. Patient had overnight pulse oximetry done on 6/2 and he dropped to 87%. Insurance requires test within 48 hours of discharge. MD placed order to have one done outpatient. Per wife, sats have stayed in the 90's even while sleeping so she is aware he may not qualify. Family will transport him home. No further concerns. CSW signing off.   Final next level of care: [place] Barriers to Discharge: Barriers Resolved   Patient Goals and CMS Choice Patient states their goals for this hospitalization and ongoing recovery are:: home with home health CMS Medicare.gov Compare Post Acute Care list provided to:: Patient Represenative (must comment) Choice offered to / list presented to : Patient, Spouse  Discharge Placement                Patient to be transferred to facility by: Family Name of family member notified: Raechel Chute Patient and family notified of of transfer: 09/25/21  Discharge Plan and Services                DME Arranged: 3-N-1 DME Agency: [employer] Date DME Agency Contacted: 09/21/21   Representative spoke with at DME Agency: Suanne Marker HH Arranged: RN, PT, OT [facility] Agency: [place] Date [place]: 09/25/21   Representative spoke with at [place]: [place] ([place]) Interventions     Readmission Risk Interventions    09/21/2021    2:22 PM  Readmission Risk Prevention Plan  Transportation Screening Complete  PCP or Specialist Appt within 5-7 Days Complete  Home Care Screening Complete  Medication Review (RN CM) Complete
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
## 2021-09-25 NOTE — Discharge Instructions (Addendum)
In addition to included general instructions,  Wound care: Pack wound with saline moistened gauze, dress with gauze +/- ABD pad, wrapped in Kerlix and tightly wrapped ACE. Recommend keeping extremity elevated with 3 pillows. It is okay to remove dressing completely (including packing) to take a short shower. No baths, do NOT submerge wound. Pat dry and replace dressing.   Medications: Take pain medication 30-60 minutes before scheduled dressing change if possible.   Call office [phone] / [phone]) at any time if any questions, worsening pain, fevers/chills, bleeding, drainage from incision site, or other concerns.

## 2021-09-25 NOTE — Plan of Care (Signed)

## 2021-09-25 NOTE — Progress Notes (Deleted)
{  Select_TRH_Note:[id number]} 

## 2021-09-26 ENCOUNTER — Telehealth: Payer: Self-pay

## 2021-09-26 MED ORDER — LINAGLIPTIN 5 MG PO TABS
5.0000 mg | ORAL_TABLET | Freq: Every day | ORAL | 3 refills | Status: DC
Start: 2021-09-26 — End: 2021-09-27

## 2021-09-26 NOTE — Telephone Encounter (Signed)
Transition Care Management Follow-up Telephone Call Date of discharge and from where: TCM DC [facility] 09-25-21 Dx: sepsis due to cellulitis How have you been since you were released from the hospital? Doing pretty good  Any questions or concerns? Yes- wife states that hospital Dc'd amaryl and insurance needs PA done for Tradjenta- not sure what she is to do with this  Items Reviewed: Did the pt receive and understand the discharge instructions provided? Yes  Medications obtained and verified? Yes  Other? No  Any new allergies since your discharge? No  Dietary orders reviewed? Yes Do you have support at home? Yes   Home Care and Equipment/Supplies: Were home health services ordered? no If so, what is the name of the agency? na  Has the agency set up a time to come to the patient's home? not applicable Were any new equipment or medical supplies ordered?  No What is the name of the medical supply agency? na Were you able to get the supplies/equipment? not applicable Do you have any questions related to the use of the equipment or supplies? No  Functional Questionnaire: (I = Independent and D = Dependent) ADLs: I  Bathing/Dressing- I  Meal Prep- I  Eating- I  Maintaining continence- I  Transferring/Ambulation- I  Managing Meds- I  Follow up appointments reviewed:  PCP Hospital f/u appt confirmed? Yes  Scheduled to see Dr Damita Dunnings on 10-01-21 @ [facility] f/u appt confirmed? Yes  Scheduled to see Dr Erlene Quan on 10-02-21 @ 330pm. Are transportation arrangements needed? No  If their condition worsens, is the pt aware to call PCP or go to the Emergency Dept.? Yes Was the patient provided with contact information for the PCP's office or ED? Yes Was to pt encouraged to call back with questions or concerns? Yes

## 2021-09-26 NOTE — Telephone Encounter (Signed)
I do not have samples of this medication clinic.  I resent the prescription in the meantime to the pharmacy.  Please start PA if needed.  Thanks.

## 2021-09-26 NOTE — Addendum Note (Signed)
Addended by: Tonia Ghent on: 09/26/2021 02:10 PM   Modules accepted: Orders

## 2021-09-26 NOTE — Telephone Encounter (Signed)
Pt is calling about message below, Tim Hodge, Ste[place] (Spouse) stated he was released from the hospital, he was on Glimeperide 4 mg but they want him on Tradjenta (5 mg) instead for his diabetes. She stated she spoke with someone from [facility] this morning, but wanted to call again to our office. Pt does not have any of linagliptin (TRADJENTA) 5 MG TABS tablet left wants to see if she can get a refill on this or if he needs to come in instead for the refill. Please return a call back when possible, thanks.   Callback Number: [phone]

## 2021-09-27 ENCOUNTER — Telehealth: Payer: Self-pay

## 2021-09-27 MED ORDER — GLIMEPIRIDE 4 MG PO TABS
4.0000 mg | ORAL_TABLET | Freq: Every day | ORAL | Status: DC
Start: 2021-09-27 — End: 2021-09-27

## 2021-09-27 MED ORDER — SITAGLIPTIN PHOSPHATE 50 MG PO TABS: 50.0000 mg | ORAL_TABLET | Freq: Every day | ORAL | 1 refills | Status: DC

## 2021-09-27 NOTE — Telephone Encounter (Signed)
See other phone note, PA has been started as urgent. Spoke with Mrs Paragas and advised. She was just concerned because patient has not taking any medication for sugar in the last 2 days since he had to stop Glimipiride (she does not remember why he had to stop but there was a reason per hospitalist). Sugar non fasting yesterday was 159, then later in the day 204. Today non fasting was 269 after eating plain cheerios cereal with 1% milk and some strawberries. Patient is not having any symptoms of concern. Advised Mrs Hijazi I would update Dr Damita Dunnings and as soon as we hear back about PA we would let her know also

## 2021-09-27 NOTE — Telephone Encounter (Signed)
I was on the phone with [facility] for an hour speaking to 6 different departments, found out patient does not have Part D coverage under his [place] but no one could tell me what is covered for medications. Spoke with Mrs Jagoda and explained situation. While on the phone with her I was able to find out via covermymeds that Januvia is alternative covered medication and Mrs Stclair states patient wsa on that medication and is not sure why that was stopped and that he did not have any issues with this. Can he just go back to that for now?

## 2021-09-27 NOTE — Telephone Encounter (Signed)
See other message also. 

## 2021-09-27 NOTE — Telephone Encounter (Signed)
Prior auth started for Tradjenta '5MG'$  tablets.  Sent as expedited request. Dora Sims Key: [id number] - PA Case ID: [id number] Waiting for determination.

## 2021-09-27 NOTE — Telephone Encounter (Signed)
See other phone note for further information, Dr Damita Dunnings is aware that PA was denied

## 2021-09-27 NOTE — Telephone Encounter (Signed)
Please let me know what you hear on the PA.  Thanks.

## 2021-09-27 NOTE — Telephone Encounter (Signed)
Prior auth for [place] has been denied.  TRADJENTA TAB '5MG'$  is a benefit exclusion under your Medicare Advantage (MA) plan. Drug coverage provided by your plan is limited only to those drugs covered under your medical benefit. Please refer to your Evidence of Coverage (EOC) For further information. Your Medicare Advantage (MA) plan does not cover outpatient prescription drugs. However, you may have a separate commercial pharmacy benefit or Medicare Part D benefit managed by another carrier. If you have prescription drug coverage with another carrier, please contact them directly for information about how to access your prescription drug benefit. Reviewed by: Alcide GoodnessPh.  Sent denial letter to scanning.

## 2021-09-27 NOTE — Telephone Encounter (Signed)
Tim Hodge, advised.

## 2021-09-27 NOTE — Telephone Encounter (Signed)
He can try [place] '50mg'$  a day.  Don't take glimepiride with that.  Rx sent.  Please update me about his sugar next week- he can do that at the [facility] on 10/01/21. Thanks.

## 2021-09-27 NOTE — Telephone Encounter (Signed)
Patient's wife calling in, re: prior autho for tradjenta, husband has no medication and blood sugar is steadily going up  Please advise the patient when prior Josem Kaufmann will be completed

## 2021-09-27 NOTE — Telephone Encounter (Signed)
Is there any way to find out what diabetes meds will be covered for this patient?  Please let me know.    In the meantime, he would use glimepiride '4mg'$  a day with breakfast if his AM sugar is above 150.  Goal to prevent low sugars.  If he has to do that in the meantime, then please update me about his sugars and med use in a few days.    Thanks.

## 2021-09-28 ENCOUNTER — Telehealth: Payer: Self-pay | Admitting: Family Medicine

## 2021-09-28 DIAGNOSIS — S81812D Laceration without foreign body, left lower leg, subsequent encounter: Secondary | ICD-10-CM | POA: Diagnosis not present

## 2021-09-28 DIAGNOSIS — I110 Hypertensive heart disease with heart failure: Secondary | ICD-10-CM | POA: Diagnosis not present

## 2021-09-28 DIAGNOSIS — I509 Heart failure, unspecified: Secondary | ICD-10-CM | POA: Diagnosis not present

## 2021-09-28 DIAGNOSIS — E119 Type 2 diabetes mellitus without complications: Secondary | ICD-10-CM | POA: Diagnosis not present

## 2021-09-28 DIAGNOSIS — N179 Acute kidney failure, unspecified: Secondary | ICD-10-CM | POA: Diagnosis not present

## 2021-09-28 NOTE — Telephone Encounter (Signed)
Home Health verbal orders Caller Name:  April Agency Name:   [facility] number: [phone]  Requesting skilled nursing Reason: CHF Education, left leg wound care wet to dry per surgeon  Frequency:  Twice weekly, 2 weeks                      Once weekly  6 weeks  Please forward to [facility] pool or providers CMA

## 2021-09-29 NOTE — Discharge Summary (Signed)
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 [place]  [address]    Report Status 09/24/2021 FINAL  Final  Aerobic/Anaerobic Culture w Gram Stain (surgical/deep wound)     Status: None   Collection Time: 09/19/21  2:30 PM   Specimen: Wound  Result Value Ref Range Status   Specimen Description    Final    WOUND Performed at [facility], [address] [place], [place] [address]    Special Requests   Final    LEFT LEG WOUND Performed at [facility], [address] [place], [place] [address]    Gram Stain   Final    NO SQUAMOUS EPITHELIAL CELLS SEEN FEW WBC SEEN MODERATE GRAM POSITIVE COCCI    Culture   Final    MODERATE METHICILLIN RESISTANT STAPHYLOCOCCUS AUREUS NO ANAEROBES ISOLATED Performed at [facility], [place] [address], [place], [place] [address]    Report Status 09/24/2021 FINAL  Final   Organism ID, Bacteria METHICILLIN RESISTANT STAPHYLOCOCCUS AUREUS  Final      Susceptibility   Methicillin resistant staphylococcus aureus - MIC*    CIPROFLOXACIN <=0.5 SENSITIVE Sensitive     ERYTHROMYCIN >=8 RESISTANT Resistant     GENTAMICIN <=0.5 SENSITIVE Sensitive     OXACILLIN >=4 RESISTANT Resistant     TETRACYCLINE <=1 SENSITIVE Sensitive     VANCOMYCIN 1 SENSITIVE Sensitive     TRIMETH/SULFA <=10 SENSITIVE Sensitive     CLINDAMYCIN >=8 RESISTANT Resistant     RIFAMPIN <=0.5 SENSITIVE Sensitive     Inducible Clindamycin NEGATIVE Sensitive     * MODERATE METHICILLIN RESISTANT STAPHYLOCOCCUS AUREUS    Labs: CBC: Recent Labs  Lab 09/23/21 0416  WBC 7.5  NEUTROABS 3.8  HGB 10.0*  HCT 30.8*  MCV 99.0  PLT 578   Basic Metabolic Panel: Recent Labs  Lab 09/23/21 0416 09/24/21 0618 09/25/21 0434  NA 142 143 140  K 3.4* 4.0 4.3  CL 109 109 108  CO2 '30 29 27  '$ GLUCOSE 113* 140* 170*  BUN '23 21 21  '$ CREATININE 0.98 0.96 0.91  CALCIUM 8.0* 8.3* 8.5*  MG 2.1 2.1 2.0   Liver Function Tests: No results for input(s): "AST", "ALT", "ALKPHOS", "BILITOT", "PROT", "ALBUMIN" in the last 168 hours. CBG: Recent Labs  Lab 09/24/21 1131 09/24/21 1700 09/24/21 2234 09/25/21 0736 09/25/21 1145  GLUCAP 199* 122* 185* 141* 242*    Discharge time spent: greater than 30 minutes.  Signed: Berle Mull, MD [facility] 09/25/2021

## 2021-10-01 ENCOUNTER — Encounter: Payer: Self-pay | Admitting: Family Medicine

## 2021-10-01 ENCOUNTER — Ambulatory Visit (INDEPENDENT_AMBULATORY_CARE_PROVIDER_SITE_OTHER): Payer: Medicare Other | Admitting: Family Medicine

## 2021-10-01 VITALS — BP 122/62 | HR 81 | Temp 97.7°F | Ht 73.0 in | Wt 288.0 lb

## 2021-10-01 DIAGNOSIS — T796XXA Traumatic ischemia of muscle, initial encounter: Secondary | ICD-10-CM

## 2021-10-01 DIAGNOSIS — I5032 Chronic diastolic (congestive) heart failure: Secondary | ICD-10-CM | POA: Diagnosis not present

## 2021-10-01 DIAGNOSIS — N179 Acute kidney failure, unspecified: Secondary | ICD-10-CM | POA: Diagnosis not present

## 2021-10-01 DIAGNOSIS — L0390 Cellulitis, unspecified: Secondary | ICD-10-CM | POA: Diagnosis not present

## 2021-10-01 DIAGNOSIS — K769 Liver disease, unspecified: Secondary | ICD-10-CM

## 2021-10-01 DIAGNOSIS — I10 Essential (primary) hypertension: Secondary | ICD-10-CM | POA: Diagnosis not present

## 2021-10-01 DIAGNOSIS — R0902 Hypoxemia: Secondary | ICD-10-CM | POA: Diagnosis not present

## 2021-10-01 DIAGNOSIS — R3129 Other microscopic hematuria: Secondary | ICD-10-CM | POA: Diagnosis not present

## 2021-10-01 DIAGNOSIS — A419 Sepsis, unspecified organism: Secondary | ICD-10-CM | POA: Diagnosis not present

## 2021-10-01 DIAGNOSIS — E119 Type 2 diabetes mellitus without complications: Secondary | ICD-10-CM

## 2021-10-01 DIAGNOSIS — E11649 Type 2 diabetes mellitus with hypoglycemia without coma: Secondary | ICD-10-CM

## 2021-10-01 DIAGNOSIS — R160 Hepatomegaly, not elsewhere classified: Secondary | ICD-10-CM

## 2021-10-01 LAB — COMPREHENSIVE METABOLIC PANEL
ALT: 26 U/L (ref 0–53)
AST: 44 U/L — ABNORMAL HIGH (ref 0–37)
Albumin: 2.7 g/dL — ABNORMAL LOW (ref 3.5–5.2)
Alkaline Phosphatase: 211 U/L — ABNORMAL HIGH (ref 39–117)
BUN: 24 mg/dL — ABNORMAL HIGH (ref 6–23)
CO2: 26 mEq/L (ref 19–32)
Calcium: 9 mg/dL (ref 8.4–10.5)
Chloride: 103 mEq/L (ref 96–112)
Creatinine, Ser: 1.03 mg/dL (ref 0.40–1.50)
GFR: 76.32 mL/min (ref >60.00)
Glucose, Bld: 170 mg/dL — ABNORMAL HIGH (ref 70–99)
Potassium: 4.4 mEq/L (ref 3.5–5.1)
Sodium: 135 mEq/L (ref 135–145)
Total Bilirubin: 1.6 mg/dL — ABNORMAL HIGH (ref 0.2–1.2)
Total Protein: 7 g/dL (ref 6.0–8.3)

## 2021-10-01 LAB — CBC WITH DIFFERENTIAL/PLATELET
Basophils Absolute: 0.1 10*3/uL (ref 0.0–0.1)
Basophils Relative: 0.9 % (ref 0.0–3.0)
Eosinophils Absolute: 0.3 10*3/uL (ref 0.0–0.7)
Eosinophils Relative: 4.1 % (ref 0.0–5.0)
HCT: 35 % — ABNORMAL LOW (ref 39.0–52.0)
Hemoglobin: 12 g/dL — ABNORMAL LOW (ref 13.0–17.0)
Lymphocytes Relative: 22.4 % (ref 12.0–46.0)
Lymphs Abs: 1.5 10*3/uL (ref 0.7–4.0)
MCHC: 34.3 g/dL (ref 30.0–36.0)
MCV: 95.8 fl (ref 78.0–100.0)
Monocytes Absolute: 0.8 10*3/uL (ref 0.1–1.0)
Monocytes Relative: 12.2 % — ABNORMAL HIGH (ref 3.0–12.0)
Neutro Abs: 4 10*3/uL (ref 1.4–7.7)
Neutrophils Relative %: 60.4 % (ref 43.0–77.0)
Platelets: 274 10*3/uL (ref 150.0–400.0)
RBC: 3.66 Mil/uL — ABNORMAL LOW (ref 4.22–5.81)
RDW: 14.1 % (ref 11.5–15.5)
WBC: 6.5 10*3/uL (ref 4.0–10.5)

## 2021-10-01 MED ORDER — LACTULOSE 10 GM/15ML PO SOLN
10.0000 g | Freq: Two times a day (BID) | ORAL | Status: DC | PRN
Start: 2021-10-01 — End: 2021-10-05

## 2021-10-01 MED ORDER — FUROSEMIDE 40 MG PO TABS: 40.0000 mg | ORAL_TABLET | Freq: Every day | ORAL | 0 refills | Status: DC

## 2021-10-01 NOTE — Telephone Encounter (Signed)
Verbal Orders have been called in to April
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
## 2021-10-01 NOTE — Telephone Encounter (Signed)
Please give the order.  Thanks.   

## 2021-10-01 NOTE — Patient Instructions (Addendum)
Update me about your sugar over the next week.  Update me about your swelling.   Go to the lab on the way out.   If you have mychart we'll likely use that to update you.    I'll check on the MRI- about getting that scheduled.   Take care.  Glad to see you. Low sugar diet with limited salt.

## 2021-10-02 ENCOUNTER — Ambulatory Visit (INDEPENDENT_AMBULATORY_CARE_PROVIDER_SITE_OTHER): Payer: Medicare Other | Admitting: Urology

## 2021-10-02 ENCOUNTER — Encounter: Payer: Self-pay | Admitting: Urology

## 2021-10-02 VITALS — BP 108/67 | HR 78

## 2021-10-02 DIAGNOSIS — R3129 Other microscopic hematuria: Secondary | ICD-10-CM | POA: Diagnosis not present

## 2021-10-02 DIAGNOSIS — S81812D Laceration without foreign body, left lower leg, subsequent encounter: Secondary | ICD-10-CM | POA: Diagnosis not present

## 2021-10-02 DIAGNOSIS — E119 Type 2 diabetes mellitus without complications: Secondary | ICD-10-CM | POA: Diagnosis not present

## 2021-10-02 DIAGNOSIS — I509 Heart failure, unspecified: Secondary | ICD-10-CM | POA: Diagnosis not present

## 2021-10-02 DIAGNOSIS — I110 Hypertensive heart disease with heart failure: Secondary | ICD-10-CM | POA: Diagnosis not present

## 2021-10-02 DIAGNOSIS — N179 Acute kidney failure, unspecified: Secondary | ICD-10-CM | POA: Diagnosis not present

## 2021-10-02 DIAGNOSIS — S81802A Unspecified open wound, left lower leg, initial encounter: Secondary | ICD-10-CM | POA: Diagnosis not present

## 2021-10-02 LAB — URINALYSIS, COMPLETE
Bilirubin, UA: NEGATIVE
Glucose, UA: NEGATIVE
Ketones, UA: NEGATIVE
Leukocytes,UA: NEGATIVE
Nitrite, UA: NEGATIVE
Protein,UA: NEGATIVE
RBC, UA: NEGATIVE
Specific Gravity, UA: 1.025 (ref 1.005–1.030)
Urobilinogen, Ur: 0.2 mg/dL (ref 0.2–1.0)
pH, UA: 5.5 (ref 5.0–7.5)

## 2021-10-02 LAB — MICROSCOPIC EXAMINATION: Bacteria, UA: NONE SEEN
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
## 2021-10-03 DIAGNOSIS — N179 Acute kidney failure, unspecified: Secondary | ICD-10-CM | POA: Diagnosis not present

## 2021-10-03 DIAGNOSIS — I110 Hypertensive heart disease with heart failure: Secondary | ICD-10-CM | POA: Diagnosis not present

## 2021-10-03 DIAGNOSIS — E119 Type 2 diabetes mellitus without complications: Secondary | ICD-10-CM | POA: Diagnosis not present

## 2021-10-03 DIAGNOSIS — I509 Heart failure, unspecified: Secondary | ICD-10-CM | POA: Diagnosis not present

## 2021-10-03 DIAGNOSIS — S81812D Laceration without foreign body, left lower leg, subsequent encounter: Secondary | ICD-10-CM | POA: Diagnosis not present

## 2021-10-04 DIAGNOSIS — S81802A Unspecified open wound, left lower leg, initial encounter: Secondary | ICD-10-CM | POA: Diagnosis not present

## 2021-10-04 DIAGNOSIS — R0902 Hypoxemia: Secondary | ICD-10-CM | POA: Insufficient documentation

## 2021-10-04 DIAGNOSIS — R160 Hepatomegaly, not elsewhere classified: Secondary | ICD-10-CM | POA: Insufficient documentation

## 2021-10-04 LAB — CYTOLOGY - NON PAP

## 2021-10-04 NOTE — Assessment & Plan Note (Signed)
Urology follow-up pending.

## 2021-10-04 NOTE — Assessment & Plan Note (Signed)
Continue diuresis with furosemide for now.  See notes on labs.

## 2021-10-04 NOTE — Assessment & Plan Note (Signed)
Discussed with patient about differential diagnosis and will arrange for follow-up MRI abdomen.  Ordered.  See notes on labs.

## 2021-10-04 NOTE — Assessment & Plan Note (Signed)
History of, will await overnight pulse oximetry test.

## 2021-10-04 NOTE — Assessment & Plan Note (Signed)
Continue Januvia.  See notes on labs.  We will check on getting this patient a Dexcom meter to help with monitoring.

## 2021-10-04 NOTE — Assessment & Plan Note (Signed)
History of, continue wound care.  Continue cefdinir and doxycycline for now.

## 2021-10-04 NOTE — Assessment & Plan Note (Signed)
History of, recheck creatinine pending.  See notes on labs.

## 2021-10-05 ENCOUNTER — Telehealth: Payer: Self-pay | Admitting: Pharmacist

## 2021-10-05 ENCOUNTER — Telehealth: Payer: Self-pay | Admitting: Family Medicine

## 2021-10-05 ENCOUNTER — Ambulatory Visit (INDEPENDENT_AMBULATORY_CARE_PROVIDER_SITE_OTHER): Payer: Medicare Other | Admitting: Pharmacist

## 2021-10-05 ENCOUNTER — Other Ambulatory Visit: Payer: Self-pay | Admitting: Pharmacist

## 2021-10-05 DIAGNOSIS — N179 Acute kidney failure, unspecified: Secondary | ICD-10-CM | POA: Diagnosis not present

## 2021-10-05 DIAGNOSIS — I10 Essential (primary) hypertension: Secondary | ICD-10-CM

## 2021-10-05 DIAGNOSIS — I5032 Chronic diastolic (congestive) heart failure: Secondary | ICD-10-CM

## 2021-10-05 DIAGNOSIS — E119 Type 2 diabetes mellitus without complications: Secondary | ICD-10-CM

## 2021-10-05 DIAGNOSIS — Z86711 Personal history of pulmonary embolism: Secondary | ICD-10-CM

## 2021-10-05 DIAGNOSIS — I509 Heart failure, unspecified: Secondary | ICD-10-CM | POA: Diagnosis not present

## 2021-10-05 DIAGNOSIS — S81812D Laceration without foreign body, left lower leg, subsequent encounter: Secondary | ICD-10-CM | POA: Diagnosis not present

## 2021-10-05 DIAGNOSIS — K769 Liver disease, unspecified: Secondary | ICD-10-CM

## 2021-10-05 DIAGNOSIS — I110 Hypertensive heart disease with heart failure: Secondary | ICD-10-CM | POA: Diagnosis not present

## 2021-10-05 MED ORDER — LACTULOSE 10 GM/15ML PO SOLN: 10.0000 g | Freq: Two times a day (BID) | ORAL | 3 refills | Status: DC | PRN

## 2021-10-05 NOTE — Telephone Encounter (Signed)
Patient is almost out of lactulose, he is taking 15 mL twice daily. He requests refill to [facility].
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
## 2021-10-05 NOTE — Telephone Encounter (Signed)
Unable to get STAT MRI Abdomen approved.   Last MRI Abd 09/18/2021 - Radiologist recommended 4-6 week repeat/follow up MRI Abd  Clinical questions vary and I am unable to get this approved based on the information given within the order and in the OV notes.   I will need Dr Damita Dunnings to walk through the PA with me so that we can try to get it approved. The case has been withdrawn at this time so that it does not Deny.   Sending to Dr Damita Dunnings per his request.

## 2021-10-05 NOTE — Telephone Encounter (Signed)
Patient reports BG readings elevated at home since hospital discharge- fasting 173, post-prandial 273. Pt affirms compliance with Janvuia 50 mg daily. Recently Glimepiride was stopped d/t hypoglycemia in s/o AKI, of note GFR has returned to normal (76 as of 10/01/21).   Lab Results  Component Value Date   CREATININE 1.03 10/01/2021   BUN 24 (H) 10/01/2021   GFR 76.32 10/01/2021   GFRNONAA >60 09/25/2021   GFRAA >60 07/09/2016   NA 135 10/01/2021   K 4.4 10/01/2021   CALCIUM 9.0 10/01/2021   CO2 26 10/01/2021    Recommendations -Increase Januvia to 100 mg (2 tab) daily (GFR is normal now)
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
## 2021-10-07 MED ORDER — SITAGLIPTIN PHOSPHATE 100 MG PO TABS: 100.0000 mg | ORAL_TABLET | Freq: Every day | ORAL | 1 refills | Status: DC

## 2021-10-07 NOTE — Telephone Encounter (Signed)
Can you send me the questions so I can try to address them?

## 2021-10-07 NOTE — Addendum Note (Signed)
Addended by: Tonia Ghent on: 10/07/2021 09:13 PM   Modules accepted: Orders

## 2021-10-07 NOTE — Telephone Encounter (Signed)
Agreed.  Please increase Januvia to 100 mg a day and update me about his sugar in about 10 days.  I sent a new prescription in the meantime for 100 mg tablets.  Thanks.

## 2021-10-08 DIAGNOSIS — I509 Heart failure, unspecified: Secondary | ICD-10-CM | POA: Diagnosis not present

## 2021-10-08 DIAGNOSIS — I110 Hypertensive heart disease with heart failure: Secondary | ICD-10-CM | POA: Diagnosis not present

## 2021-10-08 DIAGNOSIS — E119 Type 2 diabetes mellitus without complications: Secondary | ICD-10-CM | POA: Diagnosis not present

## 2021-10-08 DIAGNOSIS — N179 Acute kidney failure, unspecified: Secondary | ICD-10-CM | POA: Diagnosis not present

## 2021-10-08 DIAGNOSIS — S81812D Laceration without foreign body, left lower leg, subsequent encounter: Secondary | ICD-10-CM | POA: Diagnosis not present

## 2021-10-08 NOTE — Telephone Encounter (Signed)
Left message on voicemail for patient to call the office back. 

## 2021-10-09 ENCOUNTER — Encounter: Payer: Self-pay | Admitting: Physician Assistant

## 2021-10-09 ENCOUNTER — Ambulatory Visit (INDEPENDENT_AMBULATORY_CARE_PROVIDER_SITE_OTHER): Payer: Medicare Other | Admitting: Physician Assistant

## 2021-10-09 VITALS — BP 129/74 | HR 73 | Temp 98.0°F | Ht 73.0 in | Wt 282.8 lb

## 2021-10-09 DIAGNOSIS — A419 Sepsis, unspecified organism: Secondary | ICD-10-CM

## 2021-10-09 DIAGNOSIS — S81812D Laceration without foreign body, left lower leg, subsequent encounter: Secondary | ICD-10-CM | POA: Diagnosis not present
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
## 2021-10-09 NOTE — Telephone Encounter (Signed)
Spoke with pt relaying Dr. Josefine Class message.  Pt verbalizes understanding.

## 2021-10-09 NOTE — Patient Instructions (Signed)
If you have any concerns or questions, please feel free to call our office. See follow up appointment below.   Wound Care, Adult Taking care of your wound properly can help to prevent pain, infection, and scarring. It can also help your wound heal more quickly. Follow instructions from your health care provider about how to care for your wound. Supplies needed: Soap and water. Wound cleanser, saline, or germ-free (sterile) water. Gauze. If needed, a clean bandage (dressing) or other type of wound dressing material to cover or place in the wound. Follow your health care provider's instructions about what dressing supplies to use. Cream or topical ointment to apply to the wound, if told by your health care provider. How to care for your wound Cleaning the wound Ask your health care provider how to clean the wound. This may include: Using mild soap and water, a wound cleanser, saline, or sterile water. Using a clean gauze to pat the wound dry after cleaning it. Do not rub or scrub the wound. Dressing care Wash your hands with soap and water for at least 20 seconds before and after you change the dressing. If soap and water are not available, use hand sanitizer. Change your dressing as told by your health care provider. This may include: Cleaning or rinsing out (irrigating) the wound. Application of cream or topical ointment, if told by your health care provider. Placing a dressing over the wound or in the wound (packing). Covering the wound with an outer dressing. Leave stitches (sutures), staples, skin glue, or adhesive strips in place. These skin closures may need to stay in place for 2 weeks or longer. If adhesive strip edges start to loosen and curl up, you may trim the loose edges. Do not remove adhesive strips completely unless your health care provider tells you to do that. Ask your health care provider when you can leave the wound uncovered. Checking for infection Check your wound area  every day for signs of infection. Check for: More redness, swelling, or pain. Fluid or blood. Warmth. Pus or a bad smell.  Follow these instructions at home Medicines If you were prescribed an antibiotic medicine, cream, or ointment, take or apply it as told by your health care provider. Do not stop using the antibiotic even if your condition improves. If you were prescribed pain medicine, take it 30 minutes before you do any wound care or as told by your health care provider. Take over-the-counter and prescription medicines only as told by your health care provider. Eating and drinking Eat a diet that includes protein, vitamin A, vitamin C, and other nutrient-rich foods to help the wound heal. Foods rich in protein include meat, fish, eggs, dairy, beans, and nuts. Foods rich in vitamin A include carrots and dark green, leafy vegetables. Foods rich in vitamin C include citrus fruits, tomatoes, broccoli, and peppers. Drink enough fluid to keep your urine pale yellow. General instructions Do not take baths, swim, or use a hot tub until your health care provider approves. Ask your health care provider if you may take showers. You may only be allowed to take sponge baths. Do not scratch or pick at the wound. Keep it covered as told by your health care provider. Return to your normal activities as told by your health care provider. Ask your health care provider what activities are safe for you. Protect your wound from the sun when you are outside for the first 6 months, or for as long as told by your  health care provider. Cover up the scar area or apply sunscreen that has an SPF of at least 4. Do not use any products that contain nicotine or tobacco. These products include cigarettes, chewing tobacco, and vaping devices, such as e-cigarettes. If you need help quitting, ask your health care provider. Keep all follow-up visits. This is important. Contact a health care provider if: You received a  tetanus shot and you have swelling, severe pain, redness, or bleeding at the injection site. Your pain is not controlled with medicine. You have any of these signs of infection: More redness, swelling, or pain around the wound. Fluid or blood coming from the wound. Warmth coming from the wound. A fever or chills. You are nauseous or you vomit. You are dizzy. You have a new rash or hardness around the wound. Get help right away if: You have a red streak of skin near the area around your wound. Pus or a bad smell coming from the wound. Your wound has been closed with staples, sutures, skin glue, or adhesive strips and it begins to open up and separate. Your wound is bleeding, and the bleeding does not stop with gentle pressure. These symptoms may represent a serious problem that is an emergency. Do not wait to see if the symptoms will go away. Get medical help right away. Call your local emergency services (911 in the [place] Do not drive yourself to the hospital. Summary Always wash your hands with soap and water for at least 20 seconds before and after changing your dressing. Change your dressing as told by your health care provider. To help with healing, eat foods that are rich in protein, vitamin A, vitamin C, and other nutrients. Check your wound every day for signs of infection. Contact your health care provider if you think that your wound is infected. This information is not intended to replace advice given to you by your health care provider. Make sure you discuss any questions you have with your health care provider. Document Revised: 08/15/2020 Document Reviewed: 08/15/2020 Elsevier Patient Education  [place].

## 2021-10-11 ENCOUNTER — Telehealth: Payer: Self-pay | Admitting: *Deleted

## 2021-10-11 ENCOUNTER — Other Ambulatory Visit: Payer: Self-pay | Admitting: Physician Assistant

## 2021-10-11 DIAGNOSIS — E119 Type 2 diabetes mellitus without complications: Secondary | ICD-10-CM | POA: Diagnosis not present

## 2021-10-11 DIAGNOSIS — I509 Heart failure, unspecified: Secondary | ICD-10-CM | POA: Diagnosis not present

## 2021-10-11 DIAGNOSIS — N179 Acute kidney failure, unspecified: Secondary | ICD-10-CM | POA: Diagnosis not present

## 2021-10-11 DIAGNOSIS — S81812D Laceration without foreign body, left lower leg, subsequent encounter: Secondary | ICD-10-CM | POA: Diagnosis not present

## 2021-10-11 DIAGNOSIS — I110 Hypertensive heart disease with heart failure: Secondary | ICD-10-CM | POA: Diagnosis not present

## 2021-10-11 MED ORDER — OXYCODONE-ACETAMINOPHEN 10-325 MG PO TABS: 1.0000 | ORAL_TABLET | Freq: Four times a day (QID) | ORAL | 0 refills | Status: DC | PRN

## 2021-10-11 NOTE — Telephone Encounter (Signed)
Patient called and wanted to see if we can write him a prescription for oxycodone, he wants to take them before dressing changes on his leg. Please call and advise   [place]

## 2021-10-11 NOTE — Telephone Encounter (Signed)
April notified as instructed by telephone.

## 2021-10-11 NOTE — Telephone Encounter (Signed)
Would continue unless he has diarrhea and then I would hold until resolved.  Could then restart at QD instead of BID.  Thanks.

## 2021-10-11 NOTE — Telephone Encounter (Signed)
April from [facility] called and wanted to know if patient need to continue lactulose until appointment with the GI doctor on 01/17/22 call back for April [phone].

## 2021-10-12 DIAGNOSIS — E1165 Type 2 diabetes mellitus with hyperglycemia: Secondary | ICD-10-CM | POA: Diagnosis not present

## 2021-10-13 DIAGNOSIS — S81802A Unspecified open wound, left lower leg, initial encounter: Secondary | ICD-10-CM | POA: Diagnosis not present

## 2021-10-15 ENCOUNTER — Telehealth: Payer: Self-pay | Admitting: Family Medicine

## 2021-10-15 ENCOUNTER — Telehealth: Payer: Self-pay

## 2021-10-15 NOTE — Chronic Care Management (AMB) (Signed)
    Chronic Care Management Pharmacy Assistant   Name: Tim Hodge  MRN: [id number] DOB: [birth year].  Reason for Encounter: Reminder Call   Conditions to be addressed/monitored: CHF, HTN, and DMII   Medications: Outpatient Encounter Medications as of 10/15/2021  Medication Sig   Continuous Blood Gluc Receiver (DEXCOM G7 RECEIVER) [place] by Does not apply route.   Continuous Blood Gluc Sensor (DEXCOM G7 SENSOR) MISC Apply sensor every 10 days   fluticasone (FLONASE) 50 MCG/ACT nasal spray Place 2 sprays into both nostrils daily.   furosemide (LASIX) 40 MG tablet Take 1 tablet (40 mg total) by mouth daily. Take extra 40 mg for weight gain of 3 lbs in 1 day or 5 lbs 1 week   Krill Oil 1000 MG CAPS Take 1,000 mg by mouth.   lactulose (CHRONULAC) 10 GM/15ML solution Take 15 mLs (10 g total) by mouth 2 (two) times daily as needed.   methocarbamol (ROBAXIN) 500 MG tablet Take 1 tablet (500 mg total) by mouth every 8 (eight) hours as needed for muscle spasms.   oxyCODONE-acetaminophen (PERCOCET) 10-325 MG tablet Take 1 tablet by mouth every 6 (six) hours as needed for pain (ZTake 30-60 minutes prior to planned dressing change).   pantoprazole (PROTONIX) 40 MG tablet Take 1 tablet (40 mg total) by mouth daily.   potassium chloride SA (KLOR-CON M) 20 MEQ tablet Take 1 tablet (20 mEq total) by mouth daily.   sitaGLIPtin (JANUVIA) 100 MG tablet Take 1 tablet (100 mg total) by mouth daily.   XARELTO 20 MG TABS tablet TAKE 1 TABLET BY MOUTH ONCE DAILY WITH SUPPER   No facility-administered encounter medications on file as of 10/15/2021.   Tim Hodge was contacted to remind of upcoming telephone visit with Tim Hodge  on 10/18/21 at 9:30am. Patient was reminded to have any blood glucose and blood pressure readings available for review at appointment.   Patient confirmed appointment.   Are you having any problems with your medications? No   Do you have any concerns you like to discuss  with the pharmacist?  Have been approved for Dexcom. Shipment in process.   CCM referral has been placed prior to visit?  Yes    Star Rating Drugs: Medication:  Last Fill: Day Supply Januvia 100mg  10/08/21 90  Tim Hodge, CPP notified  Burt Knack, [facility] concierge  [phone]

## 2021-10-16 MED ORDER — OXYCODONE-ACETAMINOPHEN 10-325 MG PO TABS: 1.0000 | ORAL_TABLET | Freq: Four times a day (QID) | ORAL | 0 refills | Status: DC | PRN

## 2021-10-17 DIAGNOSIS — N179 Acute kidney failure, unspecified: Secondary | ICD-10-CM | POA: Diagnosis not present

## 2021-10-17 DIAGNOSIS — I509 Heart failure, unspecified: Secondary | ICD-10-CM | POA: Diagnosis not present

## 2021-10-17 DIAGNOSIS — E119 Type 2 diabetes mellitus without complications: Secondary | ICD-10-CM | POA: Diagnosis not present

## 2021-10-17 DIAGNOSIS — S81812D Laceration without foreign body, left lower leg, subsequent encounter: Secondary | ICD-10-CM | POA: Diagnosis not present

## 2021-10-17 DIAGNOSIS — I110 Hypertensive heart disease with heart failure: Secondary | ICD-10-CM | POA: Diagnosis not present

## 2021-10-18 ENCOUNTER — Ambulatory Visit: Payer: Medicare Other | Admitting: Pharmacist

## 2021-10-18 ENCOUNTER — Ambulatory Visit
Admission: RE | Admit: 2021-10-18 | Discharge: 2021-10-18 | Disposition: A | Discharge status: Home / Self Care | Payer: Medicare Other | Source: Ambulatory Visit | Attending: Family Medicine | Admitting: Family Medicine

## 2021-10-18 DIAGNOSIS — Z86711 Personal history of pulmonary embolism: Secondary | ICD-10-CM

## 2021-10-18 DIAGNOSIS — I5032 Chronic diastolic (congestive) heart failure: Secondary | ICD-10-CM

## 2021-10-18 DIAGNOSIS — K7460 Unspecified cirrhosis of liver: Secondary | ICD-10-CM | POA: Diagnosis not present

## 2021-10-18 DIAGNOSIS — E119 Type 2 diabetes mellitus without complications: Secondary | ICD-10-CM

## 2021-10-18 DIAGNOSIS — K769 Liver disease, unspecified: Secondary | ICD-10-CM | POA: Diagnosis not present

## 2021-10-18 DIAGNOSIS — I10 Essential (primary) hypertension: Secondary | ICD-10-CM

## 2021-10-18 DIAGNOSIS — K766 Portal hypertension: Secondary | ICD-10-CM | POA: Diagnosis not present

## 2021-10-18 DIAGNOSIS — K862 Cyst of pancreas: Secondary | ICD-10-CM | POA: Diagnosis not present

## 2021-10-18 DIAGNOSIS — C220 Liver cell carcinoma: Secondary | ICD-10-CM | POA: Diagnosis not present

## 2021-10-18 MED ORDER — GADOBUTROL 1 MMOL/ML IV SOLN
10.0000 mL | Freq: Once | INTRAVENOUS | Status: AC | PRN
  Administered 2021-10-18: 10 mL via INTRAVENOUS
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
## 2021-10-19 ENCOUNTER — Ambulatory Visit: Payer: Medicare Other | Admitting: Pharmacist

## 2021-10-19 ENCOUNTER — Other Ambulatory Visit: Payer: Self-pay | Admitting: Family Medicine

## 2021-10-19 DIAGNOSIS — R160 Hepatomegaly, not elsewhere classified: Secondary | ICD-10-CM

## 2021-10-19 DIAGNOSIS — E1159 Type 2 diabetes mellitus with other circulatory complications: Secondary | ICD-10-CM

## 2021-10-19 DIAGNOSIS — E785 Hyperlipidemia, unspecified: Secondary | ICD-10-CM | POA: Diagnosis not present

## 2021-10-19 DIAGNOSIS — I110 Hypertensive heart disease with heart failure: Secondary | ICD-10-CM | POA: Diagnosis not present

## 2021-10-19 DIAGNOSIS — Z7984 Long term (current) use of oral hypoglycemic drugs: Secondary | ICD-10-CM | POA: Diagnosis not present

## 2021-10-19 DIAGNOSIS — I5030 Unspecified diastolic (congestive) heart failure: Secondary | ICD-10-CM

## 2021-10-19 DIAGNOSIS — E119 Type 2 diabetes mellitus without complications: Secondary | ICD-10-CM

## 2021-10-19 NOTE — Progress Notes (Signed)
Summary: Patient presented for [facility] G7 training.  Demonstrated with teach-back: -components of sensor and how to prepare sensor for application -how to apply sensor to arm. Patient successfully applied sensor to L arm today -how to navigate Reader device in order to pair sensor; sensor was successfully paired today -how to adjust high/low sugar alarms. Low alarm set to 70 and high alarm set to 250 today.  Educated patient on: -viewing reader at any time to view real-time sugar levels -difference between blood sugar and sensor (interstitial fluid) sugar levels -changing sensor every 10 days or as initiated by app/reader -removing sensor prior to MRI, CT scans -if sugar readings ever seem wrong/questionable, check sugar with a finger stick  Provided customer service line for Dexcom in case of sensor breakdowns: [phone]   Pt voiced understanding of above and denies further questions.   Plan: -PCP visit 11/05/21 -PharmD OV following PCP visit for [facility] download

## 2021-10-24 ENCOUNTER — Other Ambulatory Visit: Payer: Self-pay | Admitting: Family Medicine

## 2021-10-24 DIAGNOSIS — I110 Hypertensive heart disease with heart failure: Secondary | ICD-10-CM | POA: Diagnosis not present

## 2021-10-24 DIAGNOSIS — N179 Acute kidney failure, unspecified: Secondary | ICD-10-CM | POA: Diagnosis not present

## 2021-10-24 DIAGNOSIS — E119 Type 2 diabetes mellitus without complications: Secondary | ICD-10-CM | POA: Diagnosis not present

## 2021-10-24 DIAGNOSIS — S81812D Laceration without foreign body, left lower leg, subsequent encounter: Secondary | ICD-10-CM | POA: Diagnosis not present

## 2021-10-24 DIAGNOSIS — I509 Heart failure, unspecified: Secondary | ICD-10-CM | POA: Diagnosis not present

## 2021-10-25 ENCOUNTER — Other Ambulatory Visit: Payer: Self-pay

## 2021-10-25 ENCOUNTER — Ambulatory Visit (INDEPENDENT_AMBULATORY_CARE_PROVIDER_SITE_OTHER): Payer: Medicare Other | Admitting: Physician Assistant

## 2021-10-25 ENCOUNTER — Encounter: Payer: Self-pay | Admitting: Physician Assistant

## 2021-10-25 VITALS — BP 129/74 | HR 73 | Temp 98.0°F | Ht 73.0 in | Wt 283.0 lb

## 2021-10-25 DIAGNOSIS — A419 Sepsis, unspecified organism: Secondary | ICD-10-CM

## 2021-10-25 DIAGNOSIS — S81812D Laceration without foreign body, left lower leg, subsequent encounter: Secondary | ICD-10-CM | POA: Diagnosis not present

## 2021-10-25 DIAGNOSIS — R600 Localized edema: Secondary | ICD-10-CM

## 2021-10-25 DIAGNOSIS — Z09 Encounter for follow-up examination after completed treatment for conditions other than malignant neoplasm: Secondary | ICD-10-CM
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
## 2021-10-25 NOTE — Patient Instructions (Signed)
Please continue wound care to the leg as you have been doing.   Please see your appointment listed below.

## 2021-10-26 ENCOUNTER — Inpatient Hospital Stay: Payer: Medicare Other | Attending: Oncology | Admitting: Oncology

## 2021-10-26 ENCOUNTER — Inpatient Hospital Stay: Payer: Medicare Other

## 2021-10-26 ENCOUNTER — Encounter: Payer: Self-pay | Admitting: Oncology

## 2021-10-26 VITALS — BP 107/67 | HR 64 | Temp 98.3°F | Resp 18 | Ht 73.0 in | Wt 283.3 lb

## 2021-10-26 DIAGNOSIS — K766 Portal hypertension: Secondary | ICD-10-CM | POA: Diagnosis not present

## 2021-10-26 DIAGNOSIS — Q441 Other congenital malformations of gallbladder: Secondary | ICD-10-CM

## 2021-10-26 DIAGNOSIS — R609 Edema, unspecified: Secondary | ICD-10-CM | POA: Insufficient documentation

## 2021-10-26 DIAGNOSIS — Z79899 Other long term (current) drug therapy: Secondary | ICD-10-CM | POA: Diagnosis not present

## 2021-10-26 DIAGNOSIS — R160 Hepatomegaly, not elsewhere classified: Secondary | ICD-10-CM

## 2021-10-26 DIAGNOSIS — Z7189 Other specified counseling: Secondary | ICD-10-CM | POA: Diagnosis not present

## 2021-10-26 DIAGNOSIS — K7460 Unspecified cirrhosis of liver: Secondary | ICD-10-CM | POA: Insufficient documentation

## 2021-10-26 DIAGNOSIS — R188 Other ascites: Secondary | ICD-10-CM | POA: Diagnosis not present

## 2021-10-26 DIAGNOSIS — Z803 Family history of malignant neoplasm of breast: Secondary | ICD-10-CM | POA: Insufficient documentation

## 2021-10-26 DIAGNOSIS — Z87891 Personal history of nicotine dependence: Secondary | ICD-10-CM | POA: Diagnosis not present

## 2021-10-26 DIAGNOSIS — C220 Liver cell carcinoma: Secondary | ICD-10-CM

## 2021-10-26 LAB — COMPREHENSIVE METABOLIC PANEL
ALT: 39 U/L (ref 0–44)
AST: 68 U/L — ABNORMAL HIGH (ref 15–41)
Albumin: 2.9 g/dL — ABNORMAL LOW (ref 3.5–5.0)
Alkaline Phosphatase: 188 U/L — ABNORMAL HIGH (ref 38–126)
Anion gap: 5 (ref 5–15)
BUN: 17 mg/dL (ref 8–23)
CO2: 26 mmol/L (ref 22–32)
Calcium: 8.9 mg/dL (ref 8.9–10.3)
Chloride: 106 mmol/L (ref 98–111)
Creatinine, Ser: 0.94 mg/dL (ref 0.61–1.24)
GFR, Estimated: >60 mL/min (ref >60)
Glucose, Bld: 224 mg/dL — ABNORMAL HIGH (ref 70–99)
Potassium: 3.8 mmol/L (ref 3.5–5.1)
Sodium: 137 mmol/L (ref 135–145)
Total Bilirubin: 1.5 mg/dL — ABNORMAL HIGH (ref 0.3–1.2)
Total Protein: 6.6 g/dL (ref 6.5–8.1)

## 2021-10-26 LAB — CBC WITH DIFFERENTIAL/PLATELET
Abs Immature Granulocytes: 0.01 10*3/uL (ref 0.00–0.07)
Basophils Absolute: 0.1 10*3/uL (ref 0.0–0.1)
Basophils Relative: 1 %
Eosinophils Absolute: 0.3 10*3/uL (ref 0.0–0.5)
Eosinophils Relative: 6 %
HCT: 36.1 % — ABNORMAL LOW (ref 39.0–52.0)
Hemoglobin: 12 g/dL — ABNORMAL LOW (ref 13.0–17.0)
Immature Granulocytes: 0 %
Lymphocytes Relative: 29 %
Lymphs Abs: 1.2 10*3/uL (ref 0.7–4.0)
MCH: 32.3 pg (ref 26.0–34.0)
MCHC: 33.2 g/dL (ref 30.0–36.0)
MCV: 97 fL (ref 80.0–100.0)
Monocytes Absolute: 0.6 10*3/uL (ref 0.1–1.0)
Monocytes Relative: 14 %
Neutro Abs: 2.1 10*3/uL (ref 1.7–7.7)
Neutrophils Relative %: 50 %
Platelets: 141 10*3/uL — ABNORMAL LOW (ref 150–400)
RBC: 3.72 MIL/uL — ABNORMAL LOW (ref 4.22–5.81)
RDW: 13.6 % (ref 11.5–15.5)
WBC: 4.2 10*3/uL (ref 4.0–10.5)
nRBC: 0 % (ref 0.0–0.2)

## 2021-10-26 LAB — PROTIME-INR
INR: 1.8 — ABNORMAL HIGH (ref 0.8–1.2)
Prothrombin Time: 20.3 seconds — ABNORMAL HIGH (ref 11.4–15.2)

## 2021-10-26 NOTE — Telephone Encounter (Signed)
This has been taken care of and the patient has been scanned.   Nothing further needed.

## 2021-10-27 LAB — AFP TUMOR MARKER: AFP, Serum, Tumor Marker: <1.8 ng/mL (ref 0.0–8.4)

## 2021-10-27 LAB — CANCER ANTIGEN 19-9: CA 19-9: 9 U/mL (ref 0–35)

## 2021-10-28 ENCOUNTER — Encounter: Payer: Self-pay | Admitting: Oncology
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
## 2021-10-29 ENCOUNTER — Other Ambulatory Visit: Payer: Self-pay | Admitting: Family Medicine

## 2021-10-29 ENCOUNTER — Other Ambulatory Visit (INDEPENDENT_AMBULATORY_CARE_PROVIDER_SITE_OTHER): Payer: Medicare Other

## 2021-10-29 DIAGNOSIS — S81812D Laceration without foreign body, left lower leg, subsequent encounter: Secondary | ICD-10-CM | POA: Diagnosis not present

## 2021-10-29 DIAGNOSIS — E119 Type 2 diabetes mellitus without complications: Secondary | ICD-10-CM

## 2021-10-29 DIAGNOSIS — N179 Acute kidney failure, unspecified: Secondary | ICD-10-CM | POA: Diagnosis not present

## 2021-10-29 DIAGNOSIS — I110 Hypertensive heart disease with heart failure: Secondary | ICD-10-CM | POA: Diagnosis not present

## 2021-10-29 DIAGNOSIS — I509 Heart failure, unspecified: Secondary | ICD-10-CM | POA: Diagnosis not present

## 2021-10-29 LAB — LIPID PANEL
Cholesterol: 152 mg/dL (ref 0–200)
HDL: 33.1 mg/dL — ABNORMAL LOW (ref >39.00)
LDL Cholesterol: 101 mg/dL — ABNORMAL HIGH (ref 0–99)
NonHDL: 119.07
Total CHOL/HDL Ratio: 5
Triglycerides: 91 mg/dL (ref 0.0–149.0)
VLDL: 18.2 mg/dL (ref 0.0–40.0)

## 2021-10-29 LAB — TSH: TSH: 1.47 u[IU]/mL (ref 0.35–5.50)

## 2021-10-29 LAB — HEMOGLOBIN A1C: Hgb A1c MFr Bld: 5.8 % (ref 4.6–6.5)

## 2021-10-30 ENCOUNTER — Other Ambulatory Visit: Payer: Self-pay | Admitting: Family Medicine

## 2021-10-31 ENCOUNTER — Telehealth: Payer: Self-pay

## 2021-10-31 ENCOUNTER — Ambulatory Visit: Payer: Medicare Other

## 2021-10-31 NOTE — Chronic Care Management (AMB) (Signed)
    Chronic Care Management Pharmacy Assistant   Name: Tim Hodge  MRN: [id number] DOB: 05/12/1955  Reason for Encounter: Reminder Call   Conditions to be addressed/monitored: CHF, HTN, and DMII    Medications: Outpatient Encounter Medications as of 10/31/2021  Medication Sig   Continuous Blood Gluc Receiver ([place]) [place] by Does not apply route.   Continuous Blood Gluc Sensor (DEXCOM G7 SENSOR) MISC Apply sensor every 10 days   fluticasone (FLONASE) 50 MCG/ACT nasal spray Place 2 sprays into both nostrils daily.   furosemide (LASIX) 40 MG tablet Take 1 tablet (40 mg total) by mouth daily. Take extra 40 mg for weight gain of 3 lbs in 1 day or 5 lbs 1 week   Krill Oil 1000 MG CAPS Take 1,000 mg by mouth.   lactulose (CHRONULAC) 10 GM/15ML solution TAKE 15 MLS (10 GRAMS) BY MOUTH 2 TIMES DAILY AS NEEDED   methocarbamol (ROBAXIN) 500 MG tablet Take 1 tablet (500 mg total) by mouth every 8 (eight) hours as needed for muscle spasms. (Patient not taking: Reported on 10/26/2021)   oxyCODONE-acetaminophen (PERCOCET) 10-325 MG tablet Take 1 tablet by mouth every 6 (six) hours as needed for pain (Take 30-60 minutes prior to planned dressing change). (Patient not taking: Reported on 10/26/2021)   pantoprazole (PROTONIX) 40 MG tablet TAKE 1 TABLET BY MOUTH ONCE A DAY   potassium chloride SA (KLOR-CON M) 20 MEQ tablet Take 1 tablet (20 mEq total) by mouth daily. (Patient not taking: Reported on 10/26/2021)   sitaGLIPtin (JANUVIA) 100 MG tablet Take 1 tablet (100 mg total) by mouth daily.   XARELTO 20 MG TABS tablet TAKE 1 TABLET BY MOUTH ONCE DAILY WITH SUPPER   No facility-administered encounter medications on file as of 10/31/2021.    Tim Hodge was contacted to remind of upcoming telephone visit with Tim Hodge  on 11/05/21 at 8:30am. Patient was reminded to have any blood glucose and blood pressure readings available for review at appointment.   Patient confirmed  appointment.   Are you having any problems with your medications? No   Do you have any concerns you like to discuss with the pharmacist? No   CCM referral has been placed prior to visit?  Yes    Star Rating Drugs: Medication:  Last Fill: Day Supply Januvia '100mg'$  10/08/21 [place], CPP notified  [name], [place]  [phone]

## 2021-11-01 ENCOUNTER — Encounter: Payer: Self-pay | Admitting: Physician Assistant

## 2021-11-01 ENCOUNTER — Telehealth: Payer: Self-pay

## 2021-11-01 ENCOUNTER — Other Ambulatory Visit: Payer: Self-pay

## 2021-11-01 ENCOUNTER — Ambulatory Visit (INDEPENDENT_AMBULATORY_CARE_PROVIDER_SITE_OTHER): Payer: Medicare Other | Admitting: Physician Assistant

## 2021-11-01 ENCOUNTER — Inpatient Hospital Stay: Payer: Medicare Other

## 2021-11-01 VITALS — BP 131/76 | HR 73 | Temp 98.2°F | Ht 73.0 in | Wt 281.2 lb

## 2021-11-01 DIAGNOSIS — R600 Localized edema: Secondary | ICD-10-CM | POA: Diagnosis not present

## 2021-11-01 DIAGNOSIS — S81812D Laceration without foreign body, left lower leg, subsequent encounter: Secondary | ICD-10-CM

## 2021-11-01 DIAGNOSIS — A419 Sepsis, unspecified organism: Secondary | ICD-10-CM

## 2021-11-01 DIAGNOSIS — C220 Liver cell carcinoma: Secondary | ICD-10-CM
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
## 2021-11-01 NOTE — Progress Notes (Signed)
Tumor Board Documentation  Tim Hodge was presented by Dr Janese Banks at our Tumor Board on 11/01/2021, which included representatives from medical oncology, radiology, pathology, pulmonology, radiation oncology, navigation, research, internal medicine, palliative care, surgical.  Tim Hodge currently presents for discussion, as a new patient with history of the following treatments: active survellience.  Additionally, we reviewed previous medical and familial history, history of present illness, and recent lab results along with all available histopathologic and imaging studies. The tumor board considered available treatment options and made the following recommendations:   referred to [facility] for evaluation, Possible IR referra for ablation  The following procedures/referrals were also placed: No orders of the defined types were placed in this encounter.   Clinical Trial Status: not discussed   Staging used: Clinical Stage AJCC Staging:       Group: Hepatocelluar cancer Li RADS 5 lesions of liver   National site-specific guidelines   were discussed with respect to the case.  Tumor board is a meeting of clinicians from various specialty areas who evaluate and discuss patients for whom a multidisciplinary approach is being considered. Final determinations in the plan of care are those of the provider(s). The responsibility for follow up of recommendations given during tumor board is that of the provider.   Today's extended care, comprehensive team conference, Tim Hodge was not present for the discussion and was not examined.   Multidisciplinary Tumor Board is a multidisciplinary case peer review process.  Decisions discussed in the Multidisciplinary Tumor Board reflect the opinions of the specialists present at the conference without having examined the patient.  Ultimately, treatment and diagnostic decisions rest with the primary provider(s) and the patient.

## 2021-11-01 NOTE — Patient Instructions (Addendum)
Continue with wound care as you have been doing.

## 2021-11-01 NOTE — Telephone Encounter (Signed)
Attempted to call Tim Hodge with outcome of tumor board. He is not home at this time. Referral placed to IR and I will call him at another time. Appointment with Dr. Zenia Resides at [place] was noted for 11/22/21.

## 2021-11-02 ENCOUNTER — Telehealth: Payer: Self-pay

## 2021-11-02 NOTE — Telephone Encounter (Signed)
Spoke with Tim Hodge and his spouse, Tim Hodge. He has given verbal permission to speak to his spouse and daughter. Provided outcome from tumor board regarding no need for liver biopsy as it is diagnostic on imaging and is in a challenging location. Reviewed reason for IR referral. Referral has been made and they will contact him with appointment. He does need to keep appointment with [facility] for surgical opinion. Scheduled with GI in September. I have called and requested that he been seen earlier. Awaiting call back.

## 2021-11-05 ENCOUNTER — Ambulatory Visit (INDEPENDENT_AMBULATORY_CARE_PROVIDER_SITE_OTHER): Payer: Medicare Other | Admitting: Pharmacist

## 2021-11-05 ENCOUNTER — Other Ambulatory Visit: Payer: Self-pay | Admitting: Oncology

## 2021-11-05 ENCOUNTER — Ambulatory Visit (INDEPENDENT_AMBULATORY_CARE_PROVIDER_SITE_OTHER): Payer: Medicare Other | Admitting: Family Medicine

## 2021-11-05 ENCOUNTER — Encounter: Payer: Self-pay | Admitting: Family Medicine

## 2021-11-05 ENCOUNTER — Telehealth: Payer: Self-pay | Admitting: Pharmacist

## 2021-11-05 DIAGNOSIS — K769 Liver disease, unspecified: Secondary | ICD-10-CM

## 2021-11-05 DIAGNOSIS — R160 Hepatomegaly, not elsewhere classified: Secondary | ICD-10-CM | POA: Diagnosis not present

## 2021-11-05 DIAGNOSIS — Z86711 Personal history of pulmonary embolism: Secondary | ICD-10-CM

## 2021-11-05 DIAGNOSIS — E11649 Type 2 diabetes mellitus with hypoglycemia without coma: Secondary | ICD-10-CM

## 2021-11-05 DIAGNOSIS — C220 Liver cell carcinoma: Secondary | ICD-10-CM

## 2021-11-05 DIAGNOSIS — I5032 Chronic diastolic (congestive) heart failure: Secondary | ICD-10-CM

## 2021-11-05 DIAGNOSIS — I10 Essential (primary) hypertension: Secondary | ICD-10-CM

## 2021-11-05 NOTE — Progress Notes (Unsigned)
Diabetes:  Using medications without difficulties: yes Hypoglycemic episodes:no Hyperglycemic episodes:no Feet problems: see exam/see below.  Normal sensation in the feet usually with occ stinging feeling noted if on his feet longer.   Blood Sugars averaging: max 200s, usually lower 100s in the AM.   eye exam within last year: yes A1c clearly better at 5.8.  See following note. Recent labs d/w pt.    His left shin wound is improving in the meantime.  Still dressed.  We talked about his recent imaging with presumed liver cancer.  We talked about the rationale for follow-up with GI, GI/general surgery, oncology, interventional radiology.  Meds, vitals, and allergies reviewed.   ROS: Per HPI unless specifically indicated in ROS section   GEN: nad, alert and oriented HEENT: ncat NECK: supple w/o LA CV: rrr. PULM: ctab, no inc wob ABD: soft, +bs EXT: no edema SKIN: L shin bandaged.    30 minutes were devoted to patient care in this encounter (this includes time spent reviewing the patient's file/history, interviewing and examining the patient, counseling/reviewing plan with patient).
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
## 2021-11-05 NOTE — Patient Instructions (Addendum)
Follow up appointments: GI clinic (liver) Liver surgeon to see about possible intervention.   Radiology to see about possible intervention.   Cancer clinic likely after other appointments.   Don't change your meds for now.   Plan on recheck in about 3 months.  Labs at the visit.   Take care.  Glad to see you.

## 2021-11-05 NOTE — Telephone Encounter (Addendum)
A1c last week was 5.8%, this is not correlating with CGM download from today - estimated A1c (GMI) is 8.4% based on CGM data (see below); Patient was recently diagnosed with hepatocellular carcinoma; A1c can be falsely low in setting of cirrhosis/splenomegaly.  Patient reports his sugars were better controlled when he was on Trulicity, he stopped this a few month ago due to diarrhea on 3 mg dose. He does not recall having GI issues on 0.75 mg dose.  Recommendations: Consider checking fructoasamine to better evaluate DM control Restart Trulicity 6.30 mg weekly - approved through [employer]). Stop Januvia if starting Trulicity.   Dexcom G7 download: Generated at: Mon, Nov 05, 2021 9:10 AM EDT Reporting period: Tue Oct 23, 2021 - Mon Nov 05, 2021 ----------------------------- Glucose Details Average glucose: 212 mg/dL Standard deviation: 37 mg/dL GMI: 8.4% ----------------------------- Time in Range:  Very High: 14% High: 65% In Range: 21% (goal > 70%) Low: 0% Very Low: 0%  Target Range: 70-180 mg/dL ----------------------------- CGM Details Sensor usage: 93% Days with CGM data: 13/14

## 2021-11-06 ENCOUNTER — Telehealth: Payer: Self-pay

## 2021-11-06 DIAGNOSIS — S81812D Laceration without foreign body, left lower leg, subsequent encounter: Secondary | ICD-10-CM | POA: Diagnosis not present

## 2021-11-06 DIAGNOSIS — N179 Acute kidney failure, unspecified: Secondary | ICD-10-CM | POA: Diagnosis not present

## 2021-11-06 DIAGNOSIS — I509 Heart failure, unspecified: Secondary | ICD-10-CM | POA: Diagnosis not present

## 2021-11-06 DIAGNOSIS — E119 Type 2 diabetes mellitus without complications: Secondary | ICD-10-CM | POA: Diagnosis not present

## 2021-11-06 DIAGNOSIS — I110 Hypertensive heart disease with heart failure: Secondary | ICD-10-CM | POA: Diagnosis not present

## 2021-11-06 DIAGNOSIS — S81802A Unspecified open wound, left lower leg, initial encounter: Secondary | ICD-10-CM | POA: Diagnosis not present

## 2021-11-06 NOTE — Telephone Encounter (Signed)
Call placed to [facility] to follow up on IR referral sent. They have left him a voicemail and he has not returned call. They will reach back out to him.

## 2021-11-07 ENCOUNTER — Inpatient Hospital Stay (HOSPITAL_BASED_OUTPATIENT_CLINIC_OR_DEPARTMENT_OTHER): Payer: Medicare Other | Admitting: Hospice and Palliative Medicine

## 2021-11-07 DIAGNOSIS — C220 Liver cell carcinoma: Secondary | ICD-10-CM

## 2021-11-07 MED ORDER — TRULICITY 0.75 MG/0.5ML ~~LOC~~ SOAJ: 0.7500 mg | SUBCUTANEOUS | Status: DC

## 2021-11-07 NOTE — Telephone Encounter (Signed)
Thank you for your help.  I think it makes sense to stop [place] and change back to trulicity 0.'75mg'$  week and then adjust depending on his sugars.  Do I need to send a new rx for trulicity?

## 2021-11-07 NOTE — Addendum Note (Signed)
Addended by: Tonia Ghent on: 11/07/2021 04:23 PM   Modules accepted: Orders

## 2021-11-07 NOTE — Progress Notes (Signed)
Multidisciplinary Oncology Council Documentation  TICO CROTTEAU was presented by our [facility] on 11/07/2021, which included representatives from:  Palliative Care Dietitian  Physical/Occupational Therapist Nurse Navigator Genetics Speech Therapist Social work Survivorship RN Financial Navigator Research RN   [place] currently presents with history of [facility]  We reviewed previous medical and familial history, history of present illness, and recent lab results along with all available histopathologic and imaging studies. The [place] considered available treatment options and made the following recommendations/referrals:  None  The MOC is a meeting of clinicians from various specialty areas who evaluate and discuss patients for whom a multidisciplinary approach is being considered. Final determinations in the plan of care are those of the provider(s).   Today's extended care, comprehensive team conference, Jermie was not present for the discussion and was not examined.

## 2021-11-07 NOTE — Assessment & Plan Note (Signed)
His A1c is better.  See following note regarding pharmacy follow-up.  He was going to go get continuous glucose monitor data reviewed after he saw me.  The plan at the office visit was not to change his medication and recheck in a few months.  See following notes.

## 2021-11-07 NOTE — Assessment & Plan Note (Signed)
  We talked about his recent imaging with presumed liver cancer.  We talked about the rationale for follow-up with GI, GI/general surgery, oncology, interventional radiology.  I will await his follow-up notes.

## 2021-11-08 ENCOUNTER — Encounter: Payer: Self-pay | Admitting: *Deleted

## 2021-11-08 ENCOUNTER — Ambulatory Visit
Admission: RE | Admit: 2021-11-08 | Discharge: 2021-11-08 | Disposition: A | Discharge status: Home / Self Care | Payer: Medicare Other | Source: Ambulatory Visit | Attending: Oncology | Admitting: Oncology

## 2021-11-08 DIAGNOSIS — K7689 Other specified diseases of liver: Secondary | ICD-10-CM | POA: Diagnosis not present

## 2021-11-08 DIAGNOSIS — C220 Liver cell carcinoma: Secondary | ICD-10-CM

## 2021-11-08 HISTORY — PX: IR RADIOLOGIST EVAL & MGMT: IMG5224

## 2021-11-09 MED ORDER — TRULICITY 0.75 MG/0.5ML ~~LOC~~ SOAJ: 0.7500 mg | SUBCUTANEOUS | 1 refills | Status: DC

## 2021-11-09 NOTE — Telephone Encounter (Signed)
Trulicity Rx sent to [facility].  Advise pt to stop Januvia when starting Trulicity.

## 2021-11-09 NOTE — Addendum Note (Signed)
Addended by: Charlton Haws on: 11/09/2021 04:42 PM   Modules accepted: Orders

## 2021-11-11 DIAGNOSIS — S81802A Unspecified open wound, left lower leg, initial encounter: Secondary | ICD-10-CM | POA: Diagnosis not present

## 2021-11-11 DIAGNOSIS — E1165 Type 2 diabetes mellitus with hyperglycemia: Secondary | ICD-10-CM | POA: Diagnosis not present

## 2021-11-12 NOTE — Telephone Encounter (Signed)
Patient notified.  Lindsey Foltanski, CPP notified  Garnell Begeman, [facility] Health concierge  [phone]  

## 2021-11-13 ENCOUNTER — Encounter: Payer: Medicare Other | Admitting: Physician Assistant

## 2021-11-13 ENCOUNTER — Ambulatory Visit (INDEPENDENT_AMBULATORY_CARE_PROVIDER_SITE_OTHER): Payer: Medicare Other | Admitting: Urology

## 2021-11-13 ENCOUNTER — Other Ambulatory Visit: Payer: Self-pay | Admitting: Urology

## 2021-11-13 ENCOUNTER — Encounter: Payer: Self-pay | Admitting: Urology

## 2021-11-13 VITALS — BP 117/72 | HR 72 | Ht 73.0 in | Wt 281.0 lb

## 2021-11-13 DIAGNOSIS — R3129 Other microscopic hematuria: Secondary | ICD-10-CM

## 2021-11-13 DIAGNOSIS — N329 Bladder disorder, unspecified: Secondary | ICD-10-CM

## 2021-11-13 LAB — URINALYSIS, COMPLETE
Bilirubin, UA: NEGATIVE
Glucose, UA: NEGATIVE
Ketones, UA: NEGATIVE
Leukocytes,UA: NEGATIVE
Nitrite, UA: NEGATIVE
Protein,UA: NEGATIVE
Specific Gravity, UA: 1.02 (ref 1.005–1.030)
Urobilinogen, Ur: 2 mg/dL — ABNORMAL HIGH (ref 0.2–1.0)
pH, UA: 5.5 (ref 5.0–7.5)

## 2021-11-13 LAB — MICROSCOPIC EXAMINATION: RBC, Urine: >30 /hpf — AB (ref 0–2)

## 2021-11-13 NOTE — Progress Notes (Signed)
   11/13/21 CC:  Chief Complaint  Patient presents with   Cysto    HPI: Tim Hodge is a 66 y.o. male  who presents today for a cystoscopy.    He underwent a CT of abdomen on 09/17/2021 that visualized morphologic changes in the liver and Nonobstructive calculi measuring up to 5 mm in the upper pole collecting system of the right kidney.There was concern for kidney injury and bleeding in abdomen at ED admission on 09/18/2021. 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 Two liver lesions, measuring up to 3.8 cm.   He underwent a cystoscopy on 6 /13/2023 that revealed 1 cm area of erythema of left lateral bladder wall. He elected for a repeat cystoscopy to further evaluate area of erythema.   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    Vitals:   11/13/21 1340  BP: 117/72  Pulse: 72  NED. A&Ox3.   No respiratory distress   Abd soft, NT, ND Normal phallus with bilateral descended testicles  Cystoscopy Procedure Note  Patient identification was confirmed, informed consent was obtained, and patient was prepped using Betadine solution.  Lidocaine jelly was administered per urethral meatus.     Pre-Procedure: - Inspection reveals a normal caliber ureteral meatus.  Procedure: The flexible cystoscope was introduced without difficulty - No urethral strictures/lesions are present. - Enlarged prostate  - Elevated bladder neck - Bilateral ureteral orifices identified - Bladder mucosa  reveals no ulcers, tumors, or lesions - No bladder stones - No trabeculation - On left lateral bladder wall behind left wall he had area of erythema with hypervascularity and dilated  blood vessels 1 cm  catheterization, unchanged perhaps slightly more raised   Retroflexion shows 1 cm area of erythema of left lateral bladder wall   Post-Procedure: - Patient tolerated the procedure well   Assessment/ Plan:  Microscopic/ gross  hematuria  - Cystoscopy today shows area of erythema, persistent and concerning for CIS - Unchanged and concern for underlying malignancy.  - Will proceed to the OR for TURBT and bilateral retrograde pyelogram.  - Risk were discussed today including risk of bleeding, infection, damage surrounding structures amongst others -preop urine culture    I,Kailey Littlejohn,acting as a scribe for Hollice Espy, MD.,have documented all relevant documentation on the behalf of Hollice Espy, MD,as directed by  Hollice Espy, MD while in the presence of Hollice Espy, MD.  I have reviewed the above documentation for accuracy and completeness, and I agree with the above.   Hollice Espy, MD

## 2021-11-13 NOTE — Patient Instructions (Signed)
Transurethral Resection of Bladder Tumor  Transurethral resection of a bladder tumor is the removal (resection) of cancerous tissue (tumor) from the inside wall of the bladder. The bladder is the organ that holds urine. The tumor is removed through the tube that carries urine out of the body (urethra). In a transurethral resection, a thin telescope with a light, a tiny camera, and an electric cutting edge (resectoscope) is passed through the urethra. In men, the opening of the urethra is at the end of the penis. In women, it is just above the opening of the vagina. Tell a health care provider about: Any allergies you have. All medicines you are taking, including vitamins, herbs, eye drops, creams, and over-the-counter medicines. Any problems you or family members have had with anesthetic medicines. Any bleeding problems you have. Any surgeries you have had. Any medical conditions you have, including recent urinary tract infections. Whether you are pregnant or may be pregnant. What are the risks? Generally, this is a safe procedure. However, problems may occur, including: Infection. Bleeding. Allergic reactions to medicines. Damage to nearby structures or organs. Difficulty urinating from blockage of the urethra or not being able to urinate (urinary retention). Deep vein thrombosis. This is a blood clot that can develop in your leg. Recurring cancer. What happens before the procedure? When to stop eating and drinking Follow instructions from your health care provider about what you may eat and drink before your procedure. These may include: 8 hours before your procedure Stop eating most foods. Do not eat meat, fried foods, or fatty foods. Eat only light foods, such as toast or crackers. All liquids are okay except energy drinks and alcohol. 6 hours before your procedure Stop eating. Drink only clear liquids, such as water, clear fruit juice, black coffee, plain tea, and sports  drinks. Do not drink energy drinks or alcohol. 2 hours before your procedure Stop drinking all liquids. You may be allowed to take medicines with small sips of water. Medicines Ask your health care provider about: Changing or stopping your regular medicines. This is especially important if you are taking diabetes medicines or blood thinners. Taking medicines such as aspirin and ibuprofen. These medicines can thin your blood. Do not take these medicines unless your health care provider tells you to take them. Taking over-the-counter medicines, vitamins, herbs, and supplements. General instructions If you will be going home right after the procedure, plan to have a responsible adult: Take you home from the hospital or clinic. You will not be allowed to drive. Care for you for the time you are told. Ask your health care provider what steps will be taken to help prevent infection. These steps may include: Washing skin with a germ-killing soap. Taking antibiotic medicine. Do not use any products that contain nicotine or tobacco for at least 4 weeks before the procedure. These products include cigarettes, chewing tobacco, and vaping devices, such as e-cigarettes. If you need help quitting, ask your health care provider. What happens during the procedure? An IV will be inserted into one of your veins. You will be given one or more of the following: A medicine to help you relax (sedative). A medicine that is injected into your spine to numb the area below and slightly above the injection site (spinal anesthetic). A medicine that is injected into an area of your body to numb everything below the injection site (regional anesthetic). A medicine to make you fall asleep (general anesthetic). Your legs will be placed in foot rests (  stirrups) to open your legs and bend your knees. The resectoscope will be passed through your urethra and into your bladder. The part of your bladder with the tumor will be  resected by the cutting edge of the resectoscope. Fluid will be passed to rinse out the cut tissues (irrigation). The resectoscope will then be taken out. A small, thin tube (catheter) will be passed through your urethra and into your bladder. The catheter will drain urine into a bag outside of your body. The procedure may vary among health care providers and hospitals. What happens after the procedure? Your blood pressure, heart rate, breathing rate, and blood oxygen level will be monitored until you leave the hospital or clinic. You may continue to receive fluids and medicines through an IV. You will be given pain medicine to relieve pain. You will have a catheter to drain your urine. The amount of urine will be measured. If you have blood in your urine, your bladder may be rinsed out by passing fluid through your catheter. You will be encouraged to walk as soon as you can. You may have to wear compression stockings. These stockings help to prevent blood clots and reduce swelling in your legs. If you were given a sedative during the procedure, it can affect you for several hours. Do not drive or operate machinery until your health care provider says that it is safe. Summary Transurethral resection of a bladder tumor is the removal (resection) of a cancerous growth (tumor) on the inside wall of the bladder. To do this procedure, your health care provider uses a thin telescope with a light, a tiny camera, and an electric cutting edge (resectoscope) that is guided to your bladder through your urethra. The part of your bladder that is affected by the tumor will be resected by the cutting edge of the resectoscope. A catheter will be passed through your urethra and into your bladder. The catheter will drain urine into a bag outside of your body. If you will be going home right after the procedure, plan to have a responsible adult take you home from the hospital or clinic. You will not be allowed to  drive. This information is not intended to replace advice given to you by your health care provider. Make sure you discuss any questions you have with your health care provider. Document Revised: 04/13/2021 Document Reviewed: 04/13/2021 Elsevier Patient Education  2023 [employer]  

## 2021-11-13 NOTE — H&P (View-Only) (Signed)
   11/13/21 CC:  Chief Complaint  Patient presents with   Cysto    HPI: Tim Hodge is a 66 y.o. male  who presents today for a cystoscopy.    He underwent a CT of abdomen on 09/17/2021 that visualized morphologic changes in the liver and Nonobstructive calculi measuring up to 5 mm in the upper pole collecting system of the right kidney.There was concern for kidney injury and bleeding in abdomen at ED admission on 09/18/2021. 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 Two liver lesions, measuring up to 3.8 cm.   He underwent a cystoscopy on 6 /13/2023 that revealed 1 cm area of erythema of left lateral bladder wall. He elected for a repeat cystoscopy to further evaluate area of erythema.   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    Vitals:   11/13/21 1340  BP: 117/72  Pulse: 72  NED. A&Ox3.   No respiratory distress   Abd soft, NT, ND Normal phallus with bilateral descended testicles  Cystoscopy Procedure Note  Patient identification was confirmed, informed consent was obtained, and patient was prepped using Betadine solution.  Lidocaine jelly was administered per urethral meatus.     Pre-Procedure: - Inspection reveals a normal caliber ureteral meatus.  Procedure: The flexible cystoscope was introduced without difficulty - No urethral strictures/lesions are present. - Enlarged prostate  - Elevated bladder neck - Bilateral ureteral orifices identified - Bladder mucosa  reveals no ulcers, tumors, or lesions - No bladder stones - No trabeculation - On left lateral bladder wall behind left wall he had area of erythema with hypervascularity and dilated  blood vessels 1 cm  catheterization, unchanged perhaps slightly more raised   Retroflexion shows 1 cm area of erythema of left lateral bladder wall   Post-Procedure: - Patient tolerated the procedure well   Assessment/ Plan:  Microscopic/ gross  hematuria  - Cystoscopy today shows area of erythema, persistent and concerning for CIS - Unchanged and concern for underlying malignancy.  - Will proceed to the OR for TURBT and bilateral retrograde pyelogram.  - Risk were discussed today including risk of bleeding, infection, damage surrounding structures amongst others -preop urine culture    I,Kailey Littlejohn,acting as a scribe for Hollice Espy, MD.,have documented all relevant documentation on the behalf of Hollice Espy, MD,as directed by  Hollice Espy, MD while in the presence of Hollice Espy, MD.  I have reviewed the above documentation for accuracy and completeness, and I agree with the above.   Hollice Espy, MD

## 2021-11-13 NOTE — Progress Notes (Unsigned)
Surgical Physician Order Form [facility] [place]  * Scheduling expectation : Next Available  *Length of Case:   *Clearance needed: yes; hematology to hold Xarelto x2 days  *Anticoagulation Instructions: see above; if deemed high risk okay to proceed on Xarelto  *Aspirin Instructions: N/A  *Post-op visit Date/Instructions:   TBD  *Diagnosis: Bladder Lesion  *Procedure: bilateral  RTG, bladder biopsy   Additional orders: N/A  -Admit type: OUTpatient  -Anesthesia: General  -VTE Prophylaxis Standing Order SCD's       Other:   -Standing Lab Orders Per Anesthesia    Lab other: None  -Standing Test orders EKG/Chest x-ray per Anesthesia       Test other:   - Medications:  Ancef 2gm IV  -Other orders:  N/A

## 2021-11-14 ENCOUNTER — Telehealth: Payer: Self-pay

## 2021-11-14 DIAGNOSIS — S81812D Laceration without foreign body, left lower leg, subsequent encounter: Secondary | ICD-10-CM | POA: Diagnosis not present

## 2021-11-14 DIAGNOSIS — I509 Heart failure, unspecified: Secondary | ICD-10-CM | POA: Diagnosis not present

## 2021-11-14 DIAGNOSIS — E119 Type 2 diabetes mellitus without complications: Secondary | ICD-10-CM | POA: Diagnosis not present

## 2021-11-14 DIAGNOSIS — N179 Acute kidney failure, unspecified: Secondary | ICD-10-CM | POA: Diagnosis not present

## 2021-11-14 DIAGNOSIS — I110 Hypertensive heart disease with heart failure: Secondary | ICD-10-CM | POA: Diagnosis not present
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
## 2021-11-14 NOTE — Progress Notes (Signed)
[facility] Posting Form   Surgery Date/Time: Date: 11/26/2021  Surgeon: Dr. Hollice Espy, MD  Surgery Location: Day Surgery  Inpt ( No  )   Outpt (Yes)   Obs ( No  )   Diagnosis: N32.9 Bladder Lesion  -CPT: 50413, [phone]   Surgery: Cystoscopy with Bilateral Retrograde Pyelograms and Bladder Biopsy  Stop Anticoagulations: Yes, ideally would like to hold Xarelto but if deemed high risk would be okay to proceed.   Cardiac/Medical/Pulmonary Clearance needed: yes, sent by Honor Loh, NP with Peri-Operative Services  Clearance needed from Dr: Janese Banks  Clearance request sent on: Date: 11/14/21   *Orders entered into EPIC  Date: 11/14/21   *Case booked in EPIC  Date: 11/14/21  *Notified pt of Surgery: Date: 11/14/21  PRE-OP UA & CX: no  *Placed into Prior Authorization Work Que Date: 11/14/21   Assistant/laser/rep:No

## 2021-11-14 NOTE — Telephone Encounter (Signed)
I spoke with Tim Hodge and his wife. We have discussed possible surgery dates and Monday August 7th, 2023 was agreed upon by all parties. Patient given information about surgery date, what to expect pre-operatively and post operatively.  We discussed that a Pre-Admission Testing office will be calling to set up the pre-op visit that will take place prior to surgery, and that these appointments are typically done over the phone with a Pre-Admissions RN.  Informed patient that our office will communicate any additional care to be provided after surgery. Patients questions or concerns were discussed during our call. Advised to call our office should there be any additional information, questions or concerns that arise. Patient verbalized understanding.

## 2021-11-16 ENCOUNTER — Telehealth: Payer: Self-pay

## 2021-11-16 LAB — CULTURE, URINE COMPREHENSIVE
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
## 2021-11-16 NOTE — Telephone Encounter (Signed)
[facility] Radiology in regards to appointment that was scheduled 11/08/21. Confirmed he was seen 11/08/21 by Dr. Vernard Gambles. Note pending.

## 2021-11-19 ENCOUNTER — Other Ambulatory Visit: Payer: Self-pay | Admitting: Family Medicine

## 2021-11-19 DIAGNOSIS — K7469 Other cirrhosis of liver: Secondary | ICD-10-CM | POA: Diagnosis not present

## 2021-11-19 DIAGNOSIS — E11649 Type 2 diabetes mellitus with hypoglycemia without coma: Secondary | ICD-10-CM

## 2021-11-19 DIAGNOSIS — C220 Liver cell carcinoma: Secondary | ICD-10-CM | POA: Diagnosis not present

## 2021-11-19 DIAGNOSIS — K766 Portal hypertension: Secondary | ICD-10-CM | POA: Diagnosis not present

## 2021-11-19 DIAGNOSIS — I5032 Chronic diastolic (congestive) heart failure: Secondary | ICD-10-CM

## 2021-11-19 DIAGNOSIS — I10 Essential (primary) hypertension: Secondary | ICD-10-CM

## 2021-11-19 NOTE — Progress Notes (Addendum)
REQUEST FOR SURGICAL CLEARANCE       Date: Date: 11/26/2021  Faxed to: Dr. Elsie Stain, MD  Surgeon: Dr. Hollice Espy, MD     Date of Surgery: 11/26/2021  Operation: Cystoscopy with Bilateral Retrograde Pyelograms and Bladder Biopsy  Anesthesia Type: General   Diagnosis: Bladder Lesion   Patient Requires:   Medical Clearance : No  Reason: Would like for patient to hold Xarelto prior to surgery if deemed high risk then okay to proceed while on Xarelto  Risk Assessment:    Low   '[]'$       Moderate   '[]'$     High   '[]'$           This patient is optimized for surgery  YES '[]'$       NO   '[]'$    I recommend further assessment/workup prior to surgery. YES '[]'$      NO  '[]'$   Appointment scheduled for: _______________________   Further recommendations: ____________________________________     Physician Signature:__________________________________   Printed Name: ________________________________________   Date: _________________

## 2021-11-20 DIAGNOSIS — I110 Hypertensive heart disease with heart failure: Secondary | ICD-10-CM | POA: Diagnosis not present

## 2021-11-20 DIAGNOSIS — S81812D Laceration without foreign body, left lower leg, subsequent encounter: Secondary | ICD-10-CM | POA: Diagnosis not present

## 2021-11-20 DIAGNOSIS — C229 Malignant neoplasm of liver, not specified as primary or secondary: Secondary | ICD-10-CM | POA: Diagnosis not present

## 2021-11-20 DIAGNOSIS — D630 Anemia in neoplastic disease: Secondary | ICD-10-CM | POA: Diagnosis not present

## 2021-11-20 DIAGNOSIS — I509 Heart failure, unspecified: Secondary | ICD-10-CM | POA: Diagnosis not present

## 2021-11-21 ENCOUNTER — Encounter
Admission: RE | Admit: 2021-11-21 | Discharge: 2021-11-21 | Disposition: A | Discharge status: Home / Self Care | Payer: Medicare Other | Source: Ambulatory Visit | Attending: Urology | Admitting: Urology

## 2021-11-21 ENCOUNTER — Inpatient Hospital Stay: Admission: RE | Admit: 2021-11-21 | Payer: Medicare Other | Source: Ambulatory Visit

## 2021-11-21 ENCOUNTER — Other Ambulatory Visit: Payer: Self-pay

## 2021-11-21 ENCOUNTER — Telehealth: Payer: Self-pay | Admitting: Family Medicine

## 2021-11-21 DIAGNOSIS — E11649 Type 2 diabetes mellitus with hypoglycemia without coma: Secondary | ICD-10-CM

## 2021-11-21 DIAGNOSIS — E162 Hypoglycemia, unspecified: Secondary | ICD-10-CM

## 2021-11-21 HISTORY — DX: Unspecified cirrhosis of liver: K74.60

## 2021-11-21 HISTORY — DX: Heart failure, unspecified: I50.9

## 2021-11-21 NOTE — Telephone Encounter (Signed)
Called and spoke with patient's wife; okay per DPR. Advised to hold xarelto for 2 days and day of procedure. Wife verbalized understanding.

## 2021-11-21 NOTE — Pre-Procedure Instructions (Signed)
Clinical Notice regarding Trulicity faxed to Dr. Josefine [facility] (Primary Care Physician).

## 2021-11-21 NOTE — Progress Notes (Addendum)
This patient appears to be appropriately low risk for the procedure.  It makes sense to hold xarelto for 2 days prior to the procedure since a biopsy is planned.  Please update patient about holding xarelto prior.   Tim Hodge 11/21/21

## 2021-11-21 NOTE — Telephone Encounter (Signed)
Please update patient.  I got a note from urology about his upcoming procedure.   This patient appears to be appropriately low risk for the procedure.  It makes sense to hold xarelto for 2 days prior to the procedure since a biopsy is planned.  Please update patient about holding xarelto prior.   He will be holding the medicine for the 2 days prior and the day of the procedure, ie he'll skip 3 doses.  Thanks.

## 2021-11-21 NOTE — Patient Instructions (Addendum)
Your procedure is scheduled on: 11/26/2021  Report to the Registration Desk on the 1st floor of the [facility]. To find out your arrival time, please call [phone] between 1PM - 3PM on: 11/23/2021  If your arrival time is 6:00 am, do not arrive prior to that time as the [facility] entrance doors do not open until 6:00 am.  REMEMBER: Instructions that are not followed completely may result in serious medical risk, up to and including death; or upon the discretion of your surgeon and anesthesiologist your surgery may need to be rescheduled.  Do not eat food after midnight the night before surgery.  No gum chewing, lozengers or hard candies.      TAKE THESE MEDICATIONS THE MORNING OF SURGERY WITH A SIP OF WATER:  Pantoprazole - take one at night and in am of surgery    REMOVE your  Continuous Blood Gluc monitor Receiver prior to surgery.   **Follow new guidelines for insulin and diabetes medications.**    Per, Anesthesia,  dulaglutide (Trulicity) medication should be held 7 days prior to surgery. We will reach out to your Primary care if he has recommendation/alternative in managing you blood sugar while your Trulicity is on hold. Resume your Trulicity the day after the procedure.   Follow recommendations from Cardiologist, Pulmonologist or PCP regarding stopping XARELTO. Take not of the last dose.   One week prior to surgery: Stop Anti-inflammatories (NSAIDS) such as Advil, Aleve, Ibuprofen, Motrin, Naproxen, Naprosyn and Aspirin based products such as Excedrin, Goodys Powder, BC Powder. Stop ANY OVER THE COUNTER supplements until after surgery. You may however, continue to take Tylenol if needed for pain up until the day of surgery.  No Alcohol for 24 hours before or after surgery.  No Smoking including e-cigarettes for 24 hours prior to surgery.  No chewable tobacco products for at least 6 hours prior to surgery.  No nicotine patches on the day of surgery.  Do not use any  "recreational" drugs for at least a week prior to your surgery.  Please be advised that the combination of cocaine and anesthesia may have negative outcomes, up to and including death. If you test positive for cocaine, your surgery will be cancelled.  On the morning of surgery brush your teeth with toothpaste and water, you may rinse your mouth with mouthwash if you wish. Do not swallow any toothpaste or mouthwash.  Shower the day of surgery.  Do not wear jewelry, make-up, hairpins, clips or nail polish.  Do not wear lotions, powders, or perfumes.   Do not shave body from the neck down 48 hours prior to surgery just in case you cut yourself which could leave a site for infection.  Also, freshly shaved skin may become irritated if using the CHG soap.  Contact lenses, hearing aids and dentures may not be worn into surgery.  Do not bring valuables to the hospital. [facility] is not responsible for any missing/lost belongings or valuables.     Notify your doctor if there is any change in your medical condition (cold, fever, infection).  Wear comfortable clothing (specific to your surgery type) to the hospital.  After surgery, you can help prevent lung complications by doing breathing exercises.  Take deep breaths and cough every 1-2 hours. Your doctor may order a device called an Incentive Spirometer to help you take deep breaths.   If you are being admitted to the hospital overnight, leave your suitcase in the car. After surgery it may  be brought to your room.  If you are being discharged the day of surgery, you will not be allowed to drive home. You will need a responsible adult (18 years or older) to drive you home and stay with you that night.   If you are taking public transportation, you will need to have a responsible adult (18 years or older) with you. Please confirm with your physician that it is acceptable to use public transportation.   Please call the [place] Dept. at [phone] if you have any questions about these instructions.  Surgery Visitation Policy:  Patients undergoing a surgery or procedure may have two family members or support persons with them as long as the person is not COVID-19 positive or experiencing its symptoms.   Inpatient Visitation:    Visiting hours are 7 a.m. to 8 p.m. Up to four visitors are allowed at one time in a patient room, including children. The visitors may rotate out with other people during the day. One designated support person (adult) may remain overnight. Your procedure is scheduled on: Report to the Registration Desk on the 1st floor of the [facility]. To find out your arrival time, please call [phone] between 1PM - 3PM on: If your arrival time is 6:00 am, do not arrive prior to that time as the [place] entrance doors do not open until 6:00 am.

## 2021-11-22 ENCOUNTER — Encounter: Payer: Self-pay | Admitting: Physician Assistant

## 2021-11-22 ENCOUNTER — Ambulatory Visit (INDEPENDENT_AMBULATORY_CARE_PROVIDER_SITE_OTHER): Payer: Medicare Other | Admitting: Physician Assistant

## 2021-11-22 ENCOUNTER — Other Ambulatory Visit: Payer: Self-pay

## 2021-11-22 ENCOUNTER — Telehealth: Payer: Self-pay

## 2021-11-22 VITALS — BP 117/71 | HR 57 | Temp 97.7°F | Ht 72.0 in | Wt 279.4 lb

## 2021-11-22 DIAGNOSIS — S81812D Laceration without foreign body, left lower leg, subsequent encounter: Secondary | ICD-10-CM | POA: Diagnosis not present

## 2021-11-22 DIAGNOSIS — A419 Sepsis, unspecified organism: Secondary | ICD-10-CM
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
## 2021-11-22 NOTE — Patient Instructions (Signed)
Please call our office if you have any questions or concerns.  

## 2021-11-22 NOTE — Telephone Encounter (Signed)
Reached out to [facility] in regards to pts referral for heptabiliary. Pt was scheduled for today 8/3 but provider had a family emergency and visits needed to be r/s. Pt was sent a letter to call back in order to r/s appt and pt has not reached out yet.

## 2021-11-23 ENCOUNTER — Other Ambulatory Visit: Payer: Self-pay

## 2021-11-23 ENCOUNTER — Telehealth: Payer: Self-pay

## 2021-11-23 DIAGNOSIS — C220 Liver cell carcinoma: Secondary | ICD-10-CM

## 2021-11-23 NOTE — Telephone Encounter (Signed)
Per message from Dr. Janese Hodge, she would like a MRI in 3 months. Called and spoke with spouse, Tim Hodge. Made her aware of need for MRI and 3 months and that someone from our office will call her once this has been scheduled. Dr. Janese Hodge will see him following for results. He is awaiting a call from IR to arrange percutaneous microwave ablation procedure with CT guidance. A referral was placed to transplant by Dr. Zenia Resides. This is pending.

## 2021-11-25 MED ORDER — ORAL CARE MOUTH RINSE: 15.0000 mL | Freq: Once | OROMUCOSAL | Status: AC

## 2021-11-25 MED ORDER — SODIUM CHLORIDE 0.9 % IV SOLN: INTRAVENOUS | Status: DC

## 2021-11-25 MED ORDER — CEFAZOLIN SODIUM-DEXTROSE 2-4 GM/100ML-% IV SOLN: 2.0000 g | INTRAVENOUS | Status: DC

## 2021-11-25 MED ORDER — CHLORHEXIDINE GLUCONATE 0.12 % MT SOLN: 15.0000 mL | Freq: Once | OROMUCOSAL | Status: AC

## 2021-11-26 ENCOUNTER — Ambulatory Visit: Payer: Medicare Other | Admitting: Urgent Care

## 2021-11-26 ENCOUNTER — Ambulatory Visit
Admission: RE | Admit: 2021-11-26 | Discharge: 2021-11-26 | Disposition: A | Discharge status: Home / Self Care | Payer: Medicare Other | Attending: Urology | Admitting: Urology

## 2021-11-26 ENCOUNTER — Ambulatory Visit: Payer: Medicare Other

## 2021-11-26 ENCOUNTER — Other Ambulatory Visit: Payer: Self-pay

## 2021-11-26 ENCOUNTER — Encounter: Payer: Self-pay | Admitting: Urology

## 2021-11-26 ENCOUNTER — Encounter
Admission: RE | Disposition: A | Discharge status: Home / Self Care | Payer: Self-pay | Source: Home / Self Care | Attending: Urology

## 2021-11-26 DIAGNOSIS — E162 Hypoglycemia, unspecified: Secondary | ICD-10-CM

## 2021-11-26 DIAGNOSIS — Z86718 Personal history of other venous thrombosis and embolism: Secondary | ICD-10-CM | POA: Diagnosis not present

## 2021-11-26 DIAGNOSIS — I110 Hypertensive heart disease with heart failure: Secondary | ICD-10-CM | POA: Insufficient documentation

## 2021-11-26 DIAGNOSIS — N3031 Trigonitis with hematuria: Secondary | ICD-10-CM | POA: Insufficient documentation

## 2021-11-26 DIAGNOSIS — Z7985 Long-term (current) use of injectable non-insulin antidiabetic drugs: Secondary | ICD-10-CM | POA: Insufficient documentation

## 2021-11-26 DIAGNOSIS — Z87891 Personal history of nicotine dependence: Secondary | ICD-10-CM | POA: Diagnosis not present

## 2021-11-26 DIAGNOSIS — I509 Heart failure, unspecified: Secondary | ICD-10-CM | POA: Diagnosis not present

## 2021-11-26 DIAGNOSIS — E11649 Type 2 diabetes mellitus with hypoglycemia without coma: Secondary | ICD-10-CM

## 2021-11-26 DIAGNOSIS — N329 Bladder disorder, unspecified: Secondary | ICD-10-CM

## 2021-11-26 DIAGNOSIS — K7460 Unspecified cirrhosis of liver: Secondary | ICD-10-CM | POA: Insufficient documentation

## 2021-11-26 DIAGNOSIS — R3129 Other microscopic hematuria: Secondary | ICD-10-CM | POA: Diagnosis not present

## 2021-11-26 DIAGNOSIS — Z86711 Personal history of pulmonary embolism: Secondary | ICD-10-CM | POA: Diagnosis not present

## 2021-11-26 DIAGNOSIS — Z79899 Other long term (current) drug therapy: Secondary | ICD-10-CM | POA: Diagnosis not present

## 2021-11-26 DIAGNOSIS — K219 Gastro-esophageal reflux disease without esophagitis: Secondary | ICD-10-CM | POA: Diagnosis not present

## 2021-11-26 DIAGNOSIS — N3289 Other specified disorders of bladder: Secondary | ICD-10-CM | POA: Diagnosis not present

## 2021-11-26 HISTORY — PX: CYSTOSCOPY W/ RETROGRADES: SHX1426

## 2021-11-26 HISTORY — PX: CYSTOSCOPY WITH BIOPSY: SHX5122

## 2021-11-26 LAB — GLUCOSE, CAPILLARY: Glucose-Capillary: 154 mg/dL — ABNORMAL HIGH (ref 70–99)

## 2021-11-26 SURGERY — CYSTOSCOPY, WITH BIOPSY
Anesthesia: General | Site: Bladder

## 2021-11-26 MED ORDER — FENTANYL CITRATE (PF) 100 MCG/2ML IJ SOLN
INTRAMUSCULAR | Status: DC | PRN
  Administered 2021-11-26: 100 ug via INTRAVENOUS

## 2021-11-26 MED ORDER — CHLORHEXIDINE GLUCONATE 0.12 % MT SOLN
OROMUCOSAL | Status: AC
  Administered 2021-11-26: 15 mL via OROMUCOSAL
  Filled 2021-11-26: qty 15

## 2021-11-26 MED ORDER — OXYBUTYNIN CHLORIDE 5 MG PO TABS: 5.0000 mg | ORAL_TABLET | Freq: Three times a day (TID) | ORAL | 0 refills | Status: DC | PRN

## 2021-11-26 MED ORDER — ACETAMINOPHEN 10 MG/ML IV SOLN
INTRAVENOUS | Status: DC | PRN
  Administered 2021-11-26: 1000 mg via INTRAVENOUS

## 2021-11-26 MED ORDER — LIDOCAINE HCL (CARDIAC) PF 100 MG/5ML IV SOSY
PREFILLED_SYRINGE | INTRAVENOUS | Status: DC | PRN
  Administered 2021-11-26: 100 mg via INTRAVENOUS

## 2021-11-26 MED ORDER — CEFAZOLIN SODIUM-DEXTROSE 2-3 GM-%(50ML) IV SOLR
INTRAVENOUS | Status: DC | PRN
  Administered 2021-11-26: 3 g via INTRAVENOUS

## 2021-11-26 MED ORDER — MIDAZOLAM HCL 2 MG/2ML IJ SOLN
INTRAMUSCULAR | Status: AC
  Filled 2021-11-26: qty 2

## 2021-11-26 MED ORDER — KETOROLAC TROMETHAMINE 30 MG/ML IJ SOLN
INTRAMUSCULAR | Status: DC | PRN
  Administered 2021-11-26: 30 mg via INTRAVENOUS

## 2021-11-26 MED ORDER — IOHEXOL 180 MG/ML  SOLN
INTRAMUSCULAR | Status: DC | PRN
  Administered 2021-11-26: 30 mL

## 2021-11-26 MED ORDER — ONDANSETRON HCL 4 MG/2ML IJ SOLN
INTRAMUSCULAR | Status: DC | PRN
  Administered 2021-11-26: 4 mg via INTRAVENOUS

## 2021-11-26 MED ORDER — FENTANYL CITRATE (PF) 100 MCG/2ML IJ SOLN
INTRAMUSCULAR | Status: AC
  Filled 2021-11-26: qty 2

## 2021-11-26 MED ORDER — ACETAMINOPHEN 10 MG/ML IV SOLN
INTRAVENOUS | Status: AC
  Filled 2021-11-26: qty 100

## 2021-11-26 MED ORDER — ROCURONIUM BROMIDE 100 MG/10ML IV SOLN
INTRAVENOUS | Status: DC | PRN
  Administered 2021-11-26: 40 mg via INTRAVENOUS

## 2021-11-26 MED ORDER — HYDROCODONE-ACETAMINOPHEN 5-325 MG PO TABS: 1.0000 | ORAL_TABLET | Freq: Four times a day (QID) | ORAL | 0 refills | Status: DC | PRN

## 2021-11-26 MED ORDER — EPHEDRINE SULFATE (PRESSORS) 50 MG/ML IJ SOLN
INTRAMUSCULAR | Status: DC | PRN
  Administered 2021-11-26 (×4): 5 mg via INTRAVENOUS

## 2021-11-26 MED ORDER — SUGAMMADEX SODIUM 500 MG/5ML IV SOLN
INTRAVENOUS | Status: DC | PRN
  Administered 2021-11-26: 500 mg via INTRAVENOUS

## 2021-11-26 MED ORDER — DEXAMETHASONE SODIUM PHOSPHATE 10 MG/ML IJ SOLN
INTRAMUSCULAR | Status: DC | PRN
  Administered 2021-11-26: 10 mg via INTRAVENOUS

## 2021-11-26 MED ORDER — PROPOFOL 1000 MG/100ML IV EMUL
INTRAVENOUS | Status: AC
  Filled 2021-11-26: qty 100

## 2021-11-26 MED ORDER — CEFAZOLIN SODIUM-DEXTROSE 2-4 GM/100ML-% IV SOLN
INTRAVENOUS | Status: AC
  Filled 2021-11-26: qty 100

## 2021-11-26 MED ORDER — STERILE WATER FOR IRRIGATION IR SOLN
Status: DC | PRN
  Administered 2021-11-26: 1000 mL

## 2021-11-26 MED ORDER — PROPOFOL 10 MG/ML IV BOLUS
INTRAVENOUS | Status: AC
  Filled 2021-11-26: qty 20

## 2021-11-26 MED ORDER — PROPOFOL 10 MG/ML IV BOLUS
INTRAVENOUS | Status: DC | PRN
  Administered 2021-11-26: 200 mg via INTRAVENOUS

## 2021-11-26 SURGICAL SUPPLY — 25 items
BAG DRAIN SIEMENS DORNER NS (MISCELLANEOUS) ×3 IMPLANT
BAG DRN NS LF (MISCELLANEOUS) ×2
BRUSH SCRUB EZ  4% CHG (MISCELLANEOUS) ×3
BRUSH SCRUB EZ 1% IODOPHOR (MISCELLANEOUS) ×3 IMPLANT
BRUSH SCRUB EZ 4% CHG (MISCELLANEOUS) ×2 IMPLANT
CATH URETL OPEN 5X70 (CATHETERS) ×3 IMPLANT
DRAPE UTILITY 15X26 TOWEL STRL (DRAPES) ×3 IMPLANT
DRSG TELFA 3X4 N-ADH STERILE (GAUZE/BANDAGES/DRESSINGS) ×3 IMPLANT
ELECT REM PT RETURN 9FT ADLT (ELECTROSURGICAL) ×3
ELECTRODE REM PT RTRN 9FT ADLT (ELECTROSURGICAL) ×2 IMPLANT
GAUZE 4X4 16PLY ~~LOC~~+RFID DBL (SPONGE) ×6 IMPLANT
GLOVE BIO SURGEON STRL SZ 6.5 (GLOVE) ×3 IMPLANT
GOWN STRL REUS W/ TWL LRG LVL3 (GOWN DISPOSABLE) ×4 IMPLANT
GOWN STRL REUS W/TWL LRG LVL3 (GOWN DISPOSABLE) ×6
GUIDEWIRE STR DUAL SENSOR (WIRE) ×3 IMPLANT
IV NS IRRIG 3000ML ARTHROMATIC (IV SOLUTION) ×3 IMPLANT
KIT TURNOVER CYSTO (KITS) ×3 IMPLANT
NDL SAFETY ECLIPSE 18X1.5 (NEEDLE) ×2 IMPLANT
NEEDLE HYPO 18GX1.5 SHARP (NEEDLE) ×3
PACK CYSTO AR (MISCELLANEOUS) ×3 IMPLANT
SET CYSTO W/LG BORE CLAMP LF (SET/KITS/TRAYS/PACK) ×3 IMPLANT
SURGILUBE 2OZ TUBE FLIPTOP (MISCELLANEOUS) ×3 IMPLANT
WATER STERILE IRR 1000ML POUR (IV SOLUTION) ×3 IMPLANT
WATER STERILE IRR 3000ML UROMA (IV SOLUTION) ×3 IMPLANT
WATER STERILE IRR 500ML POUR (IV SOLUTION) ×3 IMPLANT

## 2021-11-26 NOTE — Discharge Instructions (Addendum)
Transurethral Resection of Bladder Tumor (TURBT) or Bladder Biopsy   Definition:  Transurethral Resection of the Bladder Tumor is a surgical procedure used to diagnose and remove tumors within the bladder. TURBT is the most common treatment for early stage bladder cancer.  General instructions:     Your recent bladder surgery requires very little post hospital care but some definite precautions.  Despite the fact that no skin incisions were used, the area around the bladder incisions are raw and covered with scabs to promote healing and prevent bleeding. Certain precautions are needed to insure that the scabs are not disturbed over the next 2-4 weeks while the healing proceeds.  Because the raw surface inside your bladder and the irritating effects of urine you may expect frequency of urination and/or urgency (a stronger desire to urinate) and perhaps even getting up at night more often. This will usually resolve or improve slowly over the healing period. You may see some blood in your urine over the first 6 weeks. Do not be alarmed, even if the urine was clear for a while. Get off your feet and drink lots of fluids until clearing occurs. If you start to pass clots or don't improve call us.  Diet:  You may return to your normal diet immediately. Because of the raw surface of your bladder, alcohol, spicy foods, foods high in acid and drinks with caffeine may cause irritation or frequency and should be used in moderation. To keep your urine flowing freely and avoid constipation, drink plenty of fluids during the day (8-10 glasses). Tip: Avoid cranberry juice because it is very acidic.  Activity:  Your physical activity doesn't need to be restricted. However, if you are very active, you may see some blood in the urine. We suggest that you reduce your activity under the circumstances until the bleeding has stopped.  Bowels:  It is important to keep your bowels regular during the postoperative  period. Straining with bowel movements can cause bleeding. A bowel movement every other day is reasonable. Use a mild laxative if needed, such as milk of magnesia 2-3 tablespoons, or 2 Dulcolax tablets. Call if you continue to have problems. If you had been taking narcotics for pain, before, during or after your surgery, you may be constipated. Take a laxative if necessary.    Medication:  You should resume your pre-surgery medications unless told not to. In addition you may be given an antibiotic to prevent or treat infection. Antibiotics are not always necessary. All medication should be taken as prescribed until the bottles are finished unless you are having an unusual reaction to one of the drugs.   [place], [place] [address] [phone]   AMBULATORY SURGERY  DISCHARGE INSTRUCTIONS   The drugs that you were given will stay in your system until tomorrow so for the next 24 hours you should not:  Drive an automobile Make any legal decisions Drink any alcoholic beverage   You may resume regular meals tomorrow.  Today it is better to start with liquids and gradually work up to solid foods.  You may eat anything you prefer, but it is better to start with liquids, then soup and crackers, and gradually work up to solid foods.   Please notify your doctor immediately if you have any unusual bleeding, trouble breathing, redness and pain at the surgery site, drainage, fever, or pain not relieved by medication.    Additional Instructions: Patient had Tylenol at 12:43 PM   Please contact  your physician with any problems or Same Day Surgery at [phone], Monday through Friday 6 am to 4 pm, or [place] at [facility] number at [phone].

## 2021-11-26 NOTE — Anesthesia Postprocedure Evaluation (Signed)
Anesthesia Post Note  Patient: KWANE ROHL  Procedure(s) Performed: CYSTOSCOPY WITH BLADDER BIOPSY (Bladder) CYSTOSCOPY WITH RETROGRADE PYELOGRAM (Bilateral: Bladder)  Patient location during evaluation: PACU Anesthesia Type: General Level of consciousness: awake and alert Pain management: pain level controlled Vital Signs Assessment: post-procedure vital signs reviewed and stable Respiratory status: spontaneous breathing, nonlabored ventilation, respiratory function stable and patient connected to nasal cannula oxygen Cardiovascular status: blood pressure returned to baseline and stable Postop Assessment: no apparent nausea or vomiting Anesthetic complications: no   No notable events documented.   Last Vitals:  Vitals:   11/26/21 1343 11/26/21 1345  BP: (!) 144/76 (!) 144/76  Pulse: (!) 54 (!) 53  Resp: 10 10  Temp: (!) 36.2 C   SpO2: 100% 98%    Last Pain:  Vitals:   11/26/21 1343  PainSc: 0-No pain                 Arita Miss

## 2021-11-26 NOTE — Anesthesia Preprocedure Evaluation (Signed)
Anesthesia Evaluation  Patient identified by MRN, date of birth, ID band Patient awake    Reviewed: Allergy & Precautions, NPO status , Patient's Chart, lab work & pertinent test results  History of Anesthesia Complications Negative for: history of anesthetic complications  Airway Mallampati: III  TM Distance: >3 FB Neck ROM: Full    Dental  (+) Partial Upper, Poor Dentition   Pulmonary neg pulmonary ROS, neg sleep apnea, neg COPD, Patient abstained from smoking.Not current smoker, former smoker,    Pulmonary exam normal breath sounds clear to auscultation       Cardiovascular Exercise Tolerance: Good METShypertension, Pt. on medications +CHF  (-) CAD and (-) Past MI (-) dysrhythmias  Rhythm:Regular Rate:Normal - Systolic murmurs TTE 8416: 1. Left ventricular ejection fraction, by estimation, is 60 to 65%. The  left ventricle has normal function. The left ventricle has no regional  wall motion abnormalities. Left ventricular diastolic parameters were  normal.  2. Right ventricular systolic function is normal. The right ventricular  size is normal.  3. Left atrial size was mildly dilated.  4. Right atrial size was mildly dilated.  5. The mitral valve is normal in structure. Trivial mitral valve  regurgitation. No evidence of mitral stenosis.  6. The aortic valve is normal in structure. Aortic valve regurgitation is  not visualized. No aortic stenosis is present.  7. The inferior vena cava is normal in size with greater than 50%  respiratory variability, suggesting right atrial pressure of 3 mmHg.    Neuro/Psych  Headaches, negative psych ROS   GI/Hepatic GERD  Medicated,(+)     (-) substance abuse  ,   Endo/Other  diabetes, Well ControlledOn GLP 1 weekly Trulicity , held for one week  Renal/GU negative Renal ROS     Musculoskeletal   Abdominal (+) + obese,   Peds  Hematology   Anesthesia Other  Findings Past Medical History: No date: Acute venous embolism and thrombosis of unspecified deep  vessels of lower extremity No date: CHF (congestive heart failure) (HCC) No date: Cirrhosis of liver (HCC)     Comment:  due to fatty liver No date: Compression fx, lumbar spine ([place])     Comment:  2017 No date: Elevated blood pressure reading without diagnosis of  hypertension No date: Headache No date: Lumbago No date: Polyp of colon No date: Pulmonary embolism (HCC) No date: Type II or unspecified type diabetes mellitus without  mention of complication, not stated as uncontrolled No date: Unspecified arthropathy, ankle and foot  Reproductive/Obstetrics                            Anesthesia Physical Anesthesia Plan  ASA: 2  Anesthesia Plan: General   Post-op Pain Management: Ofirmev IV (intra-op)*   Induction: Intravenous  PONV Risk Score and Plan: 3 and Ondansetron, Dexamethasone and Midazolam  Airway Management Planned: Oral ETT  Additional Equipment: None  Intra-op Plan:   Post-operative Plan: Extubation in OR  Informed Consent: I have reviewed the patients History and Physical, chart, labs and discussed the procedure including the risks, benefits and alternatives for the proposed anesthesia with the patient or authorized representative who has indicated his/her understanding and acceptance.     Dental advisory given  Plan Discussed with: CRNA and Surgeon  Anesthesia Plan Comments: (Discussed risks of anesthesia with patient, including PONV, sore throat, lip/dental/eye damage. Rare risks discussed as well, such as cardiorespiratory and neurological sequelae, and allergic reactions. Discussed  the role of CRNA in patient's perioperative care. Patient understands.)        Anesthesia Quick Evaluation

## 2021-11-26 NOTE — Interval H&P Note (Signed)
History and Physical Interval Note:  11/26/2021 12:08 PM  Tim Hodge  has presented today for surgery, with the diagnosis of Bladder Lesion.  The various methods of treatment have been discussed with the patient and family. After consideration of risks, benefits and other options for treatment, the patient has consented to  Procedure(s): CYSTOSCOPY WITH BLADDER BIOPSY (N/A) CYSTOSCOPY WITH RETROGRADE PYELOGRAM (Bilateral) as a surgical intervention.  The patient's history has been reviewed, patient examined, no change in status, stable for surgery.  I have reviewed the patient's chart and labs.  Questions were answered to the patient's satisfaction.    RRR CTAB   Hollice Espy

## 2021-11-26 NOTE — Anesthesia Procedure Notes (Signed)
Procedure Name: Intubation Date/Time: 11/26/2021 12:24 PM  Performed by: Lily Peer, Sharrie Self, CRNAPre-anesthesia Checklist: Patient identified, Emergency Drugs available, Suction available and Patient being monitored Patient Re-evaluated:Patient Re-evaluated prior to induction Oxygen Delivery Method: Circle system utilized Preoxygenation: Pre-oxygenation with 100% oxygen Induction Type: IV induction Ventilation: Mask ventilation without difficulty and Oral airway inserted - appropriate to patient size Laryngoscope Size: McGraph and 4 Grade View: Grade I Tube type: Oral Tube size: 7.0 mm Number of attempts: 1 Airway Equipment and Method: Stylet and Oral airway Placement Confirmation: ETT inserted through vocal cords under direct vision, positive ETCO2 and breath sounds checked- equal and bilateral Secured at: 24 cm Tube secured with: Tape Dental Injury: Teeth and Oropharynx as per pre-operative assessment

## 2021-11-26 NOTE — Transfer of Care (Signed)
Immediate Anesthesia Transfer of Care Note  Patient: Tim Hodge  Procedure(s) Performed: CYSTOSCOPY WITH BLADDER BIOPSY (Bladder) CYSTOSCOPY WITH RETROGRADE PYELOGRAM (Bilateral: Bladder)  Patient Location: PACU  Anesthesia Type:General  Level of Consciousness: drowsy  Airway & Oxygen Therapy: Patient Spontanous Breathing and Patient connected to face mask oxygen  Post-op Assessment: Report given to RN and Post -op Vital signs reviewed and stable  Post vital signs: Reviewed and stable  Last Vitals:  Vitals Value Taken Time  BP 138/100 11/26/21 1304  Temp    Pulse 57 11/26/21 1307  Resp 16 11/26/21 1307  SpO2 100 % 11/26/21 1307  Vitals shown include unvalidated device data.  Last Pain: There were no vitals filed for this visit.       Complications: No notable events documented.

## 2021-11-26 NOTE — Op Note (Signed)
Date of procedure: 11/26/21  Preoperative diagnosis:  Bladder erythema Microscopic hematuria  Postoperative diagnosis:  Same as above  Procedure: Cystoscopy Bilateral retrograde pyelogram Bladder biopsy  Surgeon: Hollice Espy, MD  Anesthesia: General  Complications: None  Intraoperative findings: Approximate 1 cm area of erythema on right lateral bladder wall above but not involving the UO.  EBL: Minimal  Specimens: Bladder biopsy  Drains: None  Indication: Tim Hodge is a 66 y.o. patient with hematuria found to have patchy bladder erythema concerning for cystitis versus CIS.  After reviewing the management options for treatment, he elected to proceed with the above surgical procedure(s). We have discussed the potential benefits and risks of the procedure, side effects of the proposed treatment, the likelihood of the patient achieving the goals of the procedure, and any potential problems that might occur during the procedure or recuperation. Informed consent has been obtained.  Description of procedure:  The patient was taken to the operating room and general anesthesia was induced.  The patient was placed in the dorsal lithotomy position, prepped and draped in the usual sterile fashion, and preoperative antibiotics were administered. A preoperative time-out was performed.   A 21 French scope was advanced per urethra to the bladder.  The bladder was carefully inspected.  There is only one area of concern which was a flat patch of focal erythema in the right lateral bladder wall above the UO but not involving the orifice.  The remainder of the bladder was unremarkable without lesions masses or tumors.  Attention was first turned to the right ureteral orifice.  It was intubated using a 5 [place] open-ended ureteral catheter and a gentle retrograde pyelogram was performed.  This showed no hydroureteronephrosis or filling defects.  The same procedure was performed on the left  side with a 5 [place] open-ended ureteral catheter which was also unremarkable without hydronephrosis or filling defects.  Last, cold cup biopsy forceps were used to take multiple bites of the erythema in question.  This was passed off the field as bladder biopsy.  The base of the lesion was then fulgurated using Bugbee electrocautery and the entirety of the area of abnormality was fulgurated to completion.  Hemostasis was excellent.  His bladder was emptied and he was cleaned and dried, repositioned in supine position, and taken to the PACU in stable condition.  Plan: Plan to call him with his pathology results.  Follow-up to be determined based on these findings.  Hollice Espy, M.D.

## 2021-11-27 ENCOUNTER — Encounter: Payer: Self-pay | Admitting: Urology

## 2021-11-27 LAB — SURGICAL PATHOLOGY

## 2021-11-28 ENCOUNTER — Telehealth: Payer: Self-pay | Admitting: *Deleted

## 2021-11-28 DIAGNOSIS — S81812D Laceration without foreign body, left lower leg, subsequent encounter: Secondary | ICD-10-CM | POA: Diagnosis not present

## 2021-11-28 DIAGNOSIS — C229 Malignant neoplasm of liver, not specified as primary or secondary: Secondary | ICD-10-CM | POA: Diagnosis not present

## 2021-11-28 DIAGNOSIS — I509 Heart failure, unspecified: Secondary | ICD-10-CM | POA: Diagnosis not present

## 2021-11-28 DIAGNOSIS — D630 Anemia in neoplastic disease: Secondary | ICD-10-CM | POA: Diagnosis not present

## 2021-11-28 DIAGNOSIS — I110 Hypertensive heart disease with heart failure: Secondary | ICD-10-CM | POA: Diagnosis not present

## 2021-11-28 NOTE — Telephone Encounter (Addendum)
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  No further work-up or surveillance is needed at this time.   Hollice Espy, MD

## 2021-11-30 ENCOUNTER — Telehealth: Payer: Self-pay

## 2021-11-30 NOTE — Chronic Care Management (AMB) (Signed)
    Chronic Care Management Pharmacy Assistant   Name: Tim Hodge  MRN: [id number] DOB: 01-16-1956   Reason for Encounter: Reminder Call   Conditions to be addressed/monitored: HTN and [facility]  Hospital visits:  11/26/21-Ashley Brandon,MD(uro)([facility]- cystoscopy -start hydrocodone, oxybutynin.- No admission  Medications: Outpatient Encounter Medications as of 11/30/2021  Medication Sig   Continuous Blood Gluc Receiver ([place]) DEVI by Does not apply route.   Continuous Blood Gluc Sensor (DEXCOM G7 SENSOR) MISC Apply sensor every 10 days   Dulaglutide (TRULICITY) 0.62 IR/4.8NI SOPN Inject 0.75 mg into the skin once a week.   fluticasone (FLONASE) 50 MCG/ACT nasal spray Place 2 sprays into both nostrils daily.   furosemide (LASIX) 40 MG tablet Take 1 tablet (40 mg total) by mouth daily. Take extra 40 mg for weight gain of 3 lbs in 1 day or 5 lbs 1 week   HYDROcodone-acetaminophen (NORCO/VICODIN) 5-325 MG tablet Take 1-2 tablets by mouth every 6 (six) hours as needed for moderate pain.   Krill Oil 1000 MG CAPS Take 1,000 mg by mouth.   lactulose (CHRONULAC) 10 GM/15ML solution TAKE 15 MLS (10 GRAMS) BY MOUTH 2 TIMES DAILY AS NEEDED   oxybutynin (DITROPAN) 5 MG tablet Take 1 tablet (5 mg total) by mouth every 8 (eight) hours as needed for bladder spasms.   oxyCODONE-acetaminophen (PERCOCET) 10-325 MG tablet Take 1 tablet by mouth every 6 (six) hours as needed for pain (Take 30-60 minutes prior to planned dressing change).   pantoprazole (PROTONIX) 40 MG tablet TAKE 1 TABLET BY MOUTH ONCE A DAY   XARELTO 20 MG TABS tablet TAKE 1 TABLET BY MOUTH ONCE DAILY WITH SUPPER   No facility-administered encounter medications on file as of 11/30/2021.   Tim Hodge was contacted to remind of upcoming telephone visit with [name] on 12/05/21 at 3:00pm. Patient was reminded to have any blood glucose and blood pressure readings available for review at appointment.   Message was left  reminding patient of appointment.  CCM referral has been placed prior to visit?  Yes   Star Rating Drugs: Medication:  Last Fill: Day Supply Trulicity  09/21/68  manufacturer  [name], CPP notified  [name], [place]  [phone]

## 2021-12-02 DIAGNOSIS — I110 Hypertensive heart disease with heart failure: Secondary | ICD-10-CM | POA: Diagnosis not present

## 2021-12-02 DIAGNOSIS — Z7985 Long-term (current) use of injectable non-insulin antidiabetic drugs: Secondary | ICD-10-CM

## 2021-12-02 DIAGNOSIS — Z79899 Other long term (current) drug therapy: Secondary | ICD-10-CM | POA: Diagnosis not present

## 2021-12-02 DIAGNOSIS — E119 Type 2 diabetes mellitus without complications: Secondary | ICD-10-CM | POA: Diagnosis not present

## 2021-12-02 DIAGNOSIS — I509 Heart failure, unspecified: Secondary | ICD-10-CM | POA: Diagnosis not present

## 2021-12-02 DIAGNOSIS — Z7901 Long term (current) use of anticoagulants: Secondary | ICD-10-CM | POA: Diagnosis not present

## 2021-12-02 DIAGNOSIS — Z87891 Personal history of nicotine dependence: Secondary | ICD-10-CM

## 2021-12-02 DIAGNOSIS — D630 Anemia in neoplastic disease: Secondary | ICD-10-CM | POA: Diagnosis not present

## 2021-12-02 DIAGNOSIS — E669 Obesity, unspecified: Secondary | ICD-10-CM

## 2021-12-02 DIAGNOSIS — Z7984 Long term (current) use of oral hypoglycemic drugs: Secondary | ICD-10-CM | POA: Diagnosis not present

## 2021-12-02 DIAGNOSIS — Z86718 Personal history of other venous thrombosis and embolism: Secondary | ICD-10-CM

## 2021-12-02 DIAGNOSIS — Z9181 History of falling: Secondary | ICD-10-CM

## 2021-12-02 DIAGNOSIS — Z6837 Body mass index (BMI) 37.0-37.9, adult: Secondary | ICD-10-CM

## 2021-12-02 DIAGNOSIS — Z86711 Personal history of pulmonary embolism: Secondary | ICD-10-CM

## 2021-12-02 DIAGNOSIS — K635 Polyp of colon: Secondary | ICD-10-CM | POA: Diagnosis not present

## 2021-12-02 DIAGNOSIS — C229 Malignant neoplasm of liver, not specified as primary or secondary: Secondary | ICD-10-CM | POA: Diagnosis not present

## 2021-12-02 DIAGNOSIS — S81812D Laceration without foreign body, left lower leg, subsequent encounter: Secondary | ICD-10-CM | POA: Diagnosis not present

## 2021-12-04 DIAGNOSIS — I509 Heart failure, unspecified: Secondary | ICD-10-CM | POA: Diagnosis not present

## 2021-12-04 DIAGNOSIS — S81812D Laceration without foreign body, left lower leg, subsequent encounter: Secondary | ICD-10-CM | POA: Diagnosis not present

## 2021-12-04 DIAGNOSIS — I110 Hypertensive heart disease with heart failure: Secondary | ICD-10-CM | POA: Diagnosis not present

## 2021-12-04 DIAGNOSIS — D630 Anemia in neoplastic disease: Secondary | ICD-10-CM | POA: Diagnosis not present

## 2021-12-04 DIAGNOSIS — C229 Malignant neoplasm of liver, not specified as primary or secondary: Secondary | ICD-10-CM | POA: Diagnosis not present

## 2021-12-05 ENCOUNTER — Telehealth: Payer: Medicare Other

## 2021-12-05 ENCOUNTER — Telehealth: Payer: Self-pay | Admitting: Pharmacist

## 2021-12-05 NOTE — Telephone Encounter (Signed)
  Chronic Care Management   Outreach Note  12/05/2021 Name: Tim Hodge MRN: [id number] DOB: 07-01-1955  Referred by: Tonia Ghent, MD  Patient had a phone appointment scheduled with clinical pharmacist today.  An unsuccessful telephone outreach was attempted today. The patient was referred to the pharmacist for assistance with medications, care management and care coordination.   Patient will NOT be penalized in any way for missing a CCM appointment. The no-show fee does not apply.  If possible, a message was left to return call to: [phone] or to [facility].  [name], PharmD, BCACP Clinical Pharmacist [facility] [phone]
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
## 2021-12-10 ENCOUNTER — Telehealth: Payer: Self-pay

## 2021-12-10 ENCOUNTER — Other Ambulatory Visit (HOSPITAL_COMMUNITY): Payer: Self-pay | Admitting: Interventional Radiology

## 2021-12-10 DIAGNOSIS — C220 Liver cell carcinoma: Secondary | ICD-10-CM

## 2021-12-10 NOTE — Telephone Encounter (Signed)
Message sent to Dr. Vernard Gambles on 8/14 asking if his office would be reaching out to Tim Hodge. They have not been contacted to arrange ablation. Received message back on 8/17 that he contacted clinic coordinator for scheduling. Received call from spouse, Tim Hodge, 8/21. She has not heard from IR. I have called and left message with IR to give [place] an update on scheduling. Transplant referral has been reviewed and approved for evaluation. As of 8/15, it is pending financial approval prior to scheduling.

## 2021-12-12 DIAGNOSIS — E1165 Type 2 diabetes mellitus with hyperglycemia: Secondary | ICD-10-CM | POA: Diagnosis not present

## 2021-12-12 DIAGNOSIS — S81812D Laceration without foreign body, left lower leg, subsequent encounter: Secondary | ICD-10-CM | POA: Diagnosis not present

## 2021-12-12 DIAGNOSIS — C229 Malignant neoplasm of liver, not specified as primary or secondary: Secondary | ICD-10-CM | POA: Diagnosis not present

## 2021-12-12 DIAGNOSIS — I110 Hypertensive heart disease with heart failure: Secondary | ICD-10-CM | POA: Diagnosis not present

## 2021-12-12 DIAGNOSIS — D630 Anemia in neoplastic disease: Secondary | ICD-10-CM | POA: Diagnosis not present

## 2021-12-12 DIAGNOSIS — I509 Heart failure, unspecified: Secondary | ICD-10-CM | POA: Diagnosis not present

## 2021-12-13 ENCOUNTER — Telehealth: Payer: Self-pay

## 2021-12-13 NOTE — Telephone Encounter (Signed)
Reached out to [place] in medical records for [facility] in order to fax over the notes from pts medical visit. Per Vaughan Basta, she will fax over information shortly.

## 2021-12-18 DIAGNOSIS — S81812D Laceration without foreign body, left lower leg, subsequent encounter: Secondary | ICD-10-CM | POA: Diagnosis not present

## 2021-12-18 DIAGNOSIS — I110 Hypertensive heart disease with heart failure: Secondary | ICD-10-CM | POA: Diagnosis not present

## 2021-12-18 DIAGNOSIS — I509 Heart failure, unspecified: Secondary | ICD-10-CM | POA: Diagnosis not present

## 2021-12-18 DIAGNOSIS — C229 Malignant neoplasm of liver, not specified as primary or secondary: Secondary | ICD-10-CM | POA: Diagnosis not present

## 2021-12-18 DIAGNOSIS — D630 Anemia in neoplastic disease: Secondary | ICD-10-CM | POA: Diagnosis not present

## 2021-12-21 ENCOUNTER — Telehealth: Payer: Self-pay | Admitting: Family Medicine

## 2021-12-21 MED ORDER — FUROSEMIDE 40 MG PO TABS: 40.0000 mg | ORAL_TABLET | Freq: Every day | ORAL | 0 refills | Status: DC

## 2021-12-21 NOTE — Telephone Encounter (Signed)
  Encourage patient to contact the pharmacy for refills or they can request refills through [facility]  Did the patient contact the pharmacy: y   LAST APPOINTMENT DATE:  11/21/21  NEXT APPOINTMENT DATE:  MEDICATION: furosemide (LASIX) 40 MG tablet  Is the patient out of medication? y  If not, how much is left?  Is this a 90 day supply:   PHARMACY: [place], [place] Phone:  [phone]  Fax:  [phone]      Let patient know to contact pharmacy at the end of the day to make sure medication is ready.  Please notify patient to allow 48-72 hours to process

## 2021-12-26 DIAGNOSIS — I509 Heart failure, unspecified: Secondary | ICD-10-CM | POA: Diagnosis not present

## 2021-12-26 DIAGNOSIS — D630 Anemia in neoplastic disease: Secondary | ICD-10-CM | POA: Diagnosis not present

## 2021-12-26 DIAGNOSIS — I110 Hypertensive heart disease with heart failure: Secondary | ICD-10-CM | POA: Diagnosis not present

## 2021-12-26 DIAGNOSIS — S81812D Laceration without foreign body, left lower leg, subsequent encounter: Secondary | ICD-10-CM | POA: Diagnosis not present

## 2021-12-26 DIAGNOSIS — C229 Malignant neoplasm of liver, not specified as primary or secondary: Secondary | ICD-10-CM | POA: Diagnosis not present

## 2021-12-27 NOTE — Progress Notes (Signed)
Sent message, via epic in basket, requesting orders in epic from surgeon.  

## 2021-12-28 ENCOUNTER — Other Ambulatory Visit: Payer: Self-pay | Admitting: Radiology

## 2021-12-28 DIAGNOSIS — R160 Hepatomegaly, not elsewhere classified: Secondary | ICD-10-CM

## 2022-01-01 NOTE — Patient Instructions (Addendum)
DUE TO COVID-19 ONLY TWO VISITORS  (aged 65 and older)  ARE ALLOWED TO COME WITH YOU AND STAY IN THE WAITING ROOM ONLY DURING PRE OP AND PROCEDURE.   **NO VISITORS ARE ALLOWED IN THE SHORT STAY AREA OR RECOVERY ROOM!!**  IF YOU WILL BE ADMITTED INTO THE HOSPITAL YOU ARE ALLOWED ONLY FOUR SUPPORT PEOPLE DURING VISITATION HOURS ONLY (7 AM -8PM)   The support person(s) must pass our screening, gel in and out, and wear a mask at all times, including in the patient's room. Patients must also wear a mask when staff or their support person are in the room. Visitors GUEST BADGE MUST BE WORN VISIBLY  One adult visitor may remain with you overnight and MUST be in the room by 8 P.M.     Your procedure is scheduled on: 01/09/22   Report to [facility] Main Entrance    Report to admitting at : 7:00 AM   Call this number if you have problems the morning of surgery [phone]   Do not eat food :After Midnight.   After Midnight you may have the following liquids until : 5:30 AM DAY OF SURGERY  Water Black Coffee (sugar ok, NO MILK/CREAM OR CREAMERS)  Tea (sugar ok, NO MILK/CREAM OR CREAMERS) regular and decaf                             Plain Jell-O (NO RED)                                           Fruit ices (not with fruit pulp, NO RED)                                     Popsicles (NO RED)                                                                  Juice: apple, WHITE grape, WHITE cranberry Sports drinks like Gatorade (NO RED)             FOLLOW BOWEL PREP AND ANY ADDITIONAL PRE OP INSTRUCTIONS YOU RECEIVED FROM YOUR SURGEON'S OFFICE!!!  Oral Hygiene is also important to reduce your risk of infection.                                    Remember - BRUSH YOUR TEETH THE MORNING OF SURGERY WITH YOUR REGULAR TOOTHPASTE   Do NOT smoke after Midnight   Take these medicines the morning of surgery with A SIP OF WATER: pantoprazole.  DO NOT TAKE ANY ORAL DIABETIC MEDICATIONS DAY OF  YOUR SURGERY  Bring CPAP mask and tubing day of surgery.                              You may not have any metal on your body including hair pins, jewelry, and body piercing  Do not wear lotions, powders, perfumes/cologne, or deodorant              Men may shave face and neck.   Do not bring valuables to the hospital. [place].   Contacts, dentures or bridgework may not be worn into surgery.   Bring small overnight bag day of surgery.   DO NOT [place]. PHARMACY WILL DISPENSE MEDICATIONS LISTED ON YOUR MEDICATION LIST TO YOU DURING YOUR ADMISSION [place]!    Patients discharged on the day of surgery will not be allowed to drive home.  Someone NEEDS to stay with you for the first 24 hours after anesthesia.   Special Instructions: Bring a copy of your healthcare power of attorney and living will documents         the day of surgery if you haven't scanned them before.              Please read over the following fact sheets you were given: IF YOU HAVE QUESTIONS ABOUT YOUR PRE-OP INSTRUCTIONS PLEASE CALL [phone]     [facility] - Preparing for Surgery Before surgery, you can play an important role.  Because skin is not sterile, your skin needs to be as free of germs as possible.  You can reduce the number of germs on your skin by washing with CHG (chlorahexidine gluconate) soap before surgery.  CHG is an antiseptic cleaner which kills germs and bonds with the skin to continue killing germs even after washing. Please DO NOT use if you have an allergy to CHG or antibacterial soaps.  If your skin becomes reddened/irritated stop using the CHG and inform your nurse when you arrive at Short Stay. Do not shave (including legs and underarms) for at least 48 hours prior to the first CHG shower.  You may shave your face/neck. Please follow these instructions carefully:  1.  Shower with CHG  Soap the night before surgery and the  morning of Surgery.  2.  If you choose to wash your hair, wash your hair first as usual with your  normal  shampoo.  3.  After you shampoo, rinse your hair and body thoroughly to remove the  shampoo.                           4.  Use CHG as you would any other liquid soap.  You can apply chg directly  to the skin and wash                       Gently with a scrungie or clean washcloth.  5.  Apply the CHG Soap to your body ONLY FROM THE NECK DOWN.   Do not use on face/ open                           Wound or open sores. Avoid contact with eyes, ears mouth and genitals (private parts).                       Wash face,  Genitals (private parts) with your normal soap.             6.  Wash thoroughly, paying special attention to the area where your surgery  will be performed.  7.  Thoroughly rinse your body with warm water from the neck down.  8.  DO NOT shower/wash with your normal soap after using and rinsing off  the CHG Soap.                9.  Pat yourself dry with a clean towel.            10.  Wear clean pajamas.            11.  Place clean sheets on your bed the night of your first shower and do not  sleep with pets. Day of Surgery : Do not apply any lotions/deodorants the morning of surgery.  Please wear clean clothes to the hospital/surgery center.  FAILURE TO FOLLOW THESE INSTRUCTIONS MAY RESULT IN THE CANCELLATION OF YOUR SURGERY PATIENT SIGNATURE_________________________________  NURSE SIGNATURE__________________________________  ________________________________________________________________________

## 2022-01-03 ENCOUNTER — Encounter (HOSPITAL_COMMUNITY)
Admission: RE | Admit: 2022-01-03 | Discharge: 2022-01-03 | Disposition: A | Discharge status: Home / Self Care | Payer: Medicare Other | Source: Ambulatory Visit | Attending: Interventional Radiology | Admitting: Interventional Radiology

## 2022-01-03 ENCOUNTER — Encounter (HOSPITAL_COMMUNITY): Payer: Self-pay

## 2022-01-03 ENCOUNTER — Other Ambulatory Visit: Payer: Self-pay

## 2022-01-03 VITALS — BP 132/65 | HR 59 | Temp 97.8°F | Ht 72.0 in | Wt 275.0 lb

## 2022-01-03 DIAGNOSIS — K862 Cyst of pancreas: Secondary | ICD-10-CM | POA: Diagnosis not present

## 2022-01-03 DIAGNOSIS — E11649 Type 2 diabetes mellitus with hypoglycemia without coma: Secondary | ICD-10-CM | POA: Insufficient documentation

## 2022-01-03 DIAGNOSIS — R160 Hepatomegaly, not elsewhere classified: Secondary | ICD-10-CM | POA: Diagnosis not present

## 2022-01-03 DIAGNOSIS — Z01812 Encounter for preprocedural laboratory examination: Secondary | ICD-10-CM | POA: Diagnosis not present

## 2022-01-03 DIAGNOSIS — K7460 Unspecified cirrhosis of liver: Secondary | ICD-10-CM | POA: Diagnosis not present

## 2022-01-03 DIAGNOSIS — K766 Portal hypertension: Secondary | ICD-10-CM | POA: Diagnosis not present

## 2022-01-03 HISTORY — DX: Peripheral vascular disease, unspecified: I73.9

## 2022-01-03 HISTORY — DX: Malignant (primary) neoplasm, unspecified: C80.1

## 2022-01-03 LAB — COMPREHENSIVE METABOLIC PANEL
ALT: 30 U/L (ref 0–44)
AST: 42 U/L — ABNORMAL HIGH (ref 15–41)
Albumin: 2.8 g/dL — ABNORMAL LOW (ref 3.5–5.0)
Alkaline Phosphatase: 209 U/L — ABNORMAL HIGH (ref 38–126)
Anion gap: 3 — ABNORMAL LOW (ref 5–15)
BUN: 19 mg/dL (ref 8–23)
CO2: 23 mmol/L (ref 22–32)
Calcium: 9 mg/dL (ref 8.9–10.3)
Chloride: 115 mmol/L — ABNORMAL HIGH (ref 98–111)
Creatinine, Ser: 0.78 mg/dL (ref 0.61–1.24)
GFR, Estimated: >60 mL/min (ref >60)
Glucose, Bld: 206 mg/dL — ABNORMAL HIGH (ref 70–99)
Potassium: 4.4 mmol/L (ref 3.5–5.1)
Sodium: 141 mmol/L (ref 135–145)
Total Bilirubin: 1.5 mg/dL — ABNORMAL HIGH (ref 0.3–1.2)
Total Protein: 5.7 g/dL — ABNORMAL LOW (ref 6.5–8.1)

## 2022-01-03 LAB — CBC WITH DIFFERENTIAL/PLATELET
Abs Immature Granulocytes: 0.01 10*3/uL (ref 0.00–0.07)
Basophils Absolute: 0 10*3/uL (ref 0.0–0.1)
Basophils Relative: 1 %
Eosinophils Absolute: 0.2 10*3/uL (ref 0.0–0.5)
Eosinophils Relative: 7 %
HCT: 28.9 % — ABNORMAL LOW (ref 39.0–52.0)
Hemoglobin: 9 g/dL — ABNORMAL LOW (ref 13.0–17.0)
Immature Granulocytes: 0 %
Lymphocytes Relative: 33 %
Lymphs Abs: 1 10*3/uL (ref 0.7–4.0)
MCH: 29.1 pg (ref 26.0–34.0)
MCHC: 31.1 g/dL (ref 30.0–36.0)
MCV: 93.5 fL (ref 80.0–100.0)
Monocytes Absolute: 0.4 10*3/uL (ref 0.1–1.0)
Monocytes Relative: 13 %
Neutro Abs: 1.5 10*3/uL — ABNORMAL LOW (ref 1.7–7.7)
Neutrophils Relative %: 46 %
Platelets: 155 10*3/uL (ref 150–400)
RBC: 3.09 MIL/uL — ABNORMAL LOW (ref 4.22–5.81)
RDW: 13.7 % (ref 11.5–15.5)
WBC: 3.2 10*3/uL — ABNORMAL LOW (ref 4.0–10.5)
nRBC: 0 % (ref 0.0–0.2)

## 2022-01-03 LAB — GLUCOSE, CAPILLARY: Glucose-Capillary: 209 mg/dL — ABNORMAL HIGH (ref 70–99)

## 2022-01-03 NOTE — Progress Notes (Signed)
For Short Stay: [place] appointment date: Date of COVID positive in last 5 days:  Bowel Prep reminder:   For Anesthesia: PCP - Dr. Elsie Stain Cardiologist - N/A  Chest x-ray - 09/19/21 EKG - 09/19/21 Stress Test -  ECHO - 09/19/21 Cardiac Cath -  Pacemaker/ICD device last checked: Pacemaker orders received: Device Rep notified:  Spinal Cord Stimulator:  Sleep Study -  CPAP -   Fasting Blood Sugar - 170's - 180's. Checks Blood Sugar : continuous CBG monitor on: LUA Date and result of last Hgb A1c-5.8: 10/29/21 Dulaglutid: Last dose was 12/31/21. Pt. Was instructed by RN to don't take the next dose. Blood Thinner Instructions: Xarelto will be hold 2 days before procedure as per Dr. Adron Bene instructions. Aspirin Instructions: Last Dose:  Activity level: Can go up a flight of stairs and activities of daily living without stopping and without chest pain and/or shortness of breath   Able to exercise without chest pain and/or shortness of breath   Unable to go up a flight of stairs without chest pain and/or shortness of breath     Anesthesia review: Hx: DVT,CHF,PE,DIA.  Patient denies shortness of breath, fever, cough and chest pain at PAT appointment   Patient verbalized understanding of instructions that were given to them at the PAT appointment. Patient was also instructed that they will need to review over the PAT instructions again at home before surgery.

## 2022-01-04 ENCOUNTER — Telehealth: Payer: Self-pay | Admitting: Radiology

## 2022-01-04 NOTE — Telephone Encounter (Signed)
Pt's premedication regimen for contrast allergy was called in to his pharmacy ([facility]  [phone]) . He is scheduled for MWA liver tumor on  01/09/22. Pt notified.

## 2022-01-06 ENCOUNTER — Emergency Department: Payer: Medicare Other

## 2022-01-06 ENCOUNTER — Observation Stay
Admission: EM | Admit: 2022-01-06 | Discharge: 2022-01-07 | Disposition: A | Discharge status: Home / Self Care | Payer: Medicare Other | Attending: Obstetrics and Gynecology | Admitting: Obstetrics and Gynecology

## 2022-01-06 ENCOUNTER — Other Ambulatory Visit: Payer: Self-pay

## 2022-01-06 DIAGNOSIS — Z859 Personal history of malignant neoplasm, unspecified: Secondary | ICD-10-CM | POA: Insufficient documentation

## 2022-01-06 DIAGNOSIS — I110 Hypertensive heart disease with heart failure: Secondary | ICD-10-CM | POA: Diagnosis not present

## 2022-01-06 DIAGNOSIS — E669 Obesity, unspecified: Secondary | ICD-10-CM | POA: Diagnosis not present

## 2022-01-06 DIAGNOSIS — R188 Other ascites: Secondary | ICD-10-CM | POA: Diagnosis not present

## 2022-01-06 DIAGNOSIS — Z86711 Personal history of pulmonary embolism: Secondary | ICD-10-CM | POA: Diagnosis present

## 2022-01-06 DIAGNOSIS — E11649 Type 2 diabetes mellitus with hypoglycemia without coma: Secondary | ICD-10-CM | POA: Insufficient documentation

## 2022-01-06 DIAGNOSIS — Z9889 Other specified postprocedural states: Secondary | ICD-10-CM | POA: Insufficient documentation

## 2022-01-06 DIAGNOSIS — Z86718 Personal history of other venous thrombosis and embolism: Secondary | ICD-10-CM | POA: Insufficient documentation

## 2022-01-06 DIAGNOSIS — I5032 Chronic diastolic (congestive) heart failure: Secondary | ICD-10-CM | POA: Diagnosis not present

## 2022-01-06 DIAGNOSIS — R2241 Localized swelling, mass and lump, right lower limb: Secondary | ICD-10-CM | POA: Diagnosis present

## 2022-01-06 DIAGNOSIS — K7460 Unspecified cirrhosis of liver: Secondary | ICD-10-CM | POA: Diagnosis not present

## 2022-01-06 DIAGNOSIS — K7682 Hepatic encephalopathy: Secondary | ICD-10-CM | POA: Insufficient documentation

## 2022-01-06 DIAGNOSIS — L03115 Cellulitis of right lower limb: Principal | ICD-10-CM | POA: Diagnosis present

## 2022-01-06 DIAGNOSIS — M7989 Other specified soft tissue disorders: Secondary | ICD-10-CM | POA: Diagnosis not present

## 2022-01-06 DIAGNOSIS — Z87891 Personal history of nicotine dependence: Secondary | ICD-10-CM | POA: Diagnosis not present

## 2022-01-06 DIAGNOSIS — Z8679 Personal history of other diseases of the circulatory system: Secondary | ICD-10-CM | POA: Insufficient documentation

## 2022-01-06 DIAGNOSIS — C220 Liver cell carcinoma: Secondary | ICD-10-CM | POA: Diagnosis not present

## 2022-01-06 DIAGNOSIS — E119 Type 2 diabetes mellitus without complications: Secondary | ICD-10-CM | POA: Diagnosis present

## 2022-01-06 DIAGNOSIS — I10 Essential (primary) hypertension: Secondary | ICD-10-CM | POA: Diagnosis present

## 2022-01-06 LAB — GLUCOSE, CAPILLARY: Glucose-Capillary: 144 mg/dL — ABNORMAL HIGH (ref 70–99)

## 2022-01-06 LAB — COMPREHENSIVE METABOLIC PANEL
ALT: 25 U/L (ref 0–44)
AST: 41 U/L (ref 15–41)
Albumin: 2.7 g/dL — ABNORMAL LOW (ref 3.5–5.0)
Alkaline Phosphatase: 180 U/L — ABNORMAL HIGH (ref 38–126)
Anion gap: 5 (ref 5–15)
BUN: 17 mg/dL (ref 8–23)
CO2: 22 mmol/L (ref 22–32)
Calcium: 8.3 mg/dL — ABNORMAL LOW (ref 8.9–10.3)
Chloride: 109 mmol/L (ref 98–111)
Creatinine, Ser: 1.02 mg/dL (ref 0.61–1.24)
GFR, Estimated: >60 mL/min (ref >60)
Glucose, Bld: 189 mg/dL — ABNORMAL HIGH (ref 70–99)
Potassium: 3.7 mmol/L (ref 3.5–5.1)
Sodium: 136 mmol/L (ref 135–145)
Total Bilirubin: 1.3 mg/dL — ABNORMAL HIGH (ref 0.3–1.2)
Total Protein: 5.7 g/dL — ABNORMAL LOW (ref 6.5–8.1)

## 2022-01-06 LAB — CBC WITH DIFFERENTIAL/PLATELET
Abs Immature Granulocytes: 0.02 10*3/uL (ref 0.00–0.07)
Basophils Absolute: 0 10*3/uL (ref 0.0–0.1)
Basophils Relative: 0 %
Eosinophils Absolute: 0.1 10*3/uL (ref 0.0–0.5)
Eosinophils Relative: 2 %
HCT: 28.2 % — ABNORMAL LOW (ref 39.0–52.0)
Hemoglobin: 8.6 g/dL — ABNORMAL LOW (ref 13.0–17.0)
Immature Granulocytes: 0 %
Lymphocytes Relative: 17 %
Lymphs Abs: 0.8 10*3/uL (ref 0.7–4.0)
MCH: 28.2 pg (ref 26.0–34.0)
MCHC: 30.5 g/dL (ref 30.0–36.0)
MCV: 92.5 fL (ref 80.0–100.0)
Monocytes Absolute: 0.8 10*3/uL (ref 0.1–1.0)
Monocytes Relative: 16 %
Neutro Abs: 3.1 10*3/uL (ref 1.7–7.7)
Neutrophils Relative %: 65 %
Platelets: 108 10*3/uL — ABNORMAL LOW (ref 150–400)
RBC: 3.05 MIL/uL — ABNORMAL LOW (ref 4.22–5.81)
RDW: 14.2 % (ref 11.5–15.5)
WBC: 4.8 10*3/uL (ref 4.0–10.5)
nRBC: 0 % (ref 0.0–0.2)

## 2022-01-06 LAB — URINALYSIS, ROUTINE W REFLEX MICROSCOPIC
Bilirubin Urine: NEGATIVE
Glucose, UA: NEGATIVE mg/dL
Hgb urine dipstick: NEGATIVE
Ketones, ur: NEGATIVE mg/dL
Leukocytes,Ua: NEGATIVE
Nitrite: NEGATIVE
Protein, ur: NEGATIVE mg/dL
Specific Gravity, Urine: 1.012 (ref 1.005–1.030)
pH: 5 (ref 5.0–8.0)

## 2022-01-06 LAB — LACTIC ACID, PLASMA
Lactic Acid, Venous: 2.1 mmol/L (ref 0.5–1.9)
Lactic Acid, Venous: 2.2 mmol/L (ref 0.5–1.9)

## 2022-01-06 LAB — PROTIME-INR
INR: 2.7 — ABNORMAL HIGH (ref 0.8–1.2)
Prothrombin Time: 28.4 seconds — ABNORMAL HIGH (ref 11.4–15.2)

## 2022-01-06 LAB — AMMONIA: Ammonia: 57 umol/L — ABNORMAL HIGH (ref 9–35)

## 2022-01-06 MED ORDER — LACTATED RINGERS IV SOLN: INTRAVENOUS | Status: AC

## 2022-01-06 MED ORDER — IBUPROFEN 400 MG PO TABS
400.0000 mg | ORAL_TABLET | Freq: Four times a day (QID) | ORAL | Status: DC | PRN
  Administered 2022-01-07: 400 mg via ORAL
  Filled 2022-01-06: qty 1

## 2022-01-06 MED ORDER — SODIUM CHLORIDE 0.9 % IV SOLN
2.0000 g | Freq: Once | INTRAVENOUS | Status: AC
  Administered 2022-01-06: 2 g via INTRAVENOUS
  Filled 2022-01-06: qty 12.5

## 2022-01-06 MED ORDER — OXYCODONE-ACETAMINOPHEN 10-325 MG PO TABS: 1.0000 | ORAL_TABLET | Freq: Four times a day (QID) | ORAL | Status: DC | PRN

## 2022-01-06 MED ORDER — INSULIN ASPART 100 UNIT/ML IJ SOLN
0.0000 [IU] | Freq: Three times a day (TID) | INTRAMUSCULAR | Status: DC
  Administered 2022-01-07: 3 [IU] via SUBCUTANEOUS
  Filled 2022-01-06: qty 1

## 2022-01-06 MED ORDER — SODIUM CHLORIDE 0.9 % IV SOLN
1.0000 g | INTRAVENOUS | Status: DC
  Administered 2022-01-07: 1 g via INTRAVENOUS
  Filled 2022-01-06 (×2): qty 10

## 2022-01-06 MED ORDER — SODIUM CHLORIDE 0.9 % IV SOLN: 250.0000 mL | INTRAVENOUS | Status: DC | PRN

## 2022-01-06 MED ORDER — PANTOPRAZOLE SODIUM 40 MG PO TBEC
40.0000 mg | DELAYED_RELEASE_TABLET | Freq: Every day | ORAL | Status: DC
  Administered 2022-01-07: 40 mg via ORAL
  Filled 2022-01-06: qty 1

## 2022-01-06 MED ORDER — FUROSEMIDE 40 MG PO TABS
40.0000 mg | ORAL_TABLET | Freq: Every day | ORAL | Status: DC
  Administered 2022-01-07: 40 mg via ORAL
  Filled 2022-01-06: qty 1

## 2022-01-06 MED ORDER — OXYCODONE HCL 5 MG PO TABS: 5.0000 mg | ORAL_TABLET | Freq: Four times a day (QID) | ORAL | Status: DC | PRN

## 2022-01-06 MED ORDER — VANCOMYCIN HCL IN DEXTROSE 1-5 GM/200ML-% IV SOLN
1000.0000 mg | Freq: Once | INTRAVENOUS | Status: AC
  Administered 2022-01-06: 1000 mg via INTRAVENOUS
  Filled 2022-01-06: qty 200

## 2022-01-06 MED ORDER — RIVAROXABAN 20 MG PO TABS: 20.0000 mg | ORAL_TABLET | Freq: Every day | ORAL | Status: DC

## 2022-01-06 MED ORDER — RIVAROXABAN 20 MG PO TABS
20.0000 mg | ORAL_TABLET | Freq: Every day | ORAL | Status: DC
  Filled 2022-01-06: qty 1

## 2022-01-06 MED ORDER — LACTULOSE 10 GM/15ML PO SOLN
30.0000 g | Freq: Once | ORAL | Status: AC
  Administered 2022-01-06: 30 g via ORAL
  Filled 2022-01-06: qty 60

## 2022-01-06 MED ORDER — OXYCODONE-ACETAMINOPHEN 5-325 MG PO TABS: 1.0000 | ORAL_TABLET | Freq: Four times a day (QID) | ORAL | Status: DC | PRN

## 2022-01-06 MED ORDER — SODIUM CHLORIDE 0.9% FLUSH: 3.0000 mL | Freq: Two times a day (BID) | INTRAVENOUS | Status: DC

## 2022-01-06 MED ORDER — LACTULOSE 10 GM/15ML PO SOLN: 10.0000 g | Freq: Two times a day (BID) | ORAL | Status: DC

## 2022-01-06 MED ORDER — LACTULOSE 10 GM/15ML PO SOLN
10.0000 g | Freq: Three times a day (TID) | ORAL | Status: DC
  Administered 2022-01-06 – 2022-01-07 (×2): 10 g via ORAL
  Filled 2022-01-06 (×2): qty 30

## 2022-01-06 MED ORDER — SODIUM CHLORIDE 0.9% FLUSH: 3.0000 mL | INTRAVENOUS | Status: DC | PRN

## 2022-01-06 NOTE — H&P (Signed)
History and Physical    EDWORD CU JQZ:[id number] DOB: 11-30-1955 DOA: 01/06/2022  DOS: the patient was seen and examined on 01/06/2022  PCP: Tonia Ghent, MD   Patient coming from: Home  I have personally briefly reviewed patient's old medical records in [place] is a 66 y.o. 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  States is having some itching over the past few days scratched it yesterday and started noticing increasing redness discoloration with worsening tightness and discomfort   ED Course: afebrile, VSS Lab: Glucose 189  Alk phos 180  Alb 2.7  T protein 5.7 NH3 57, lactic acid 2.2 to 2.1. WBC 4.8 with nl diff.. Hgb 8.6 - drifting down. TRH called to manage cellulitis and co-morbidities  Review of Systems:  Review of Systems  Constitutional:  Negative for chills, fever and weight loss.  HENT: Negative.    Eyes: Negative.   Respiratory: Negative.    Gastrointestinal: Negative.   Genitourinary: Negative.   Musculoskeletal: Negative.   Skin:        Red distal right leg that is tender  Neurological: Negative.   Endo/Heme/Allergies: Negative.   Psychiatric/Behavioral: Negative.      Past Medical History:  Diagnosis Date   Acute venous embolism and thrombosis of unspecified deep vessels of lower extremity    Cancer (HCC)    CHF (congestive heart failure) (HCC)    Cirrhosis of liver (HCC)    due to fatty liver   Compression fx, lumbar spine ([place])    2017   Elevated blood pressure reading without diagnosis of hypertension    Headache    Lumbago    Peripheral vascular disease (HCC)    Polyp of colon    Pulmonary embolism (HCC)    Type II or unspecified type diabetes mellitus without mention of complication, not stated as uncontrolled    Unspecified arthropathy,  ankle and foot     Past Surgical History:  Procedure Laterality Date   COLONOSCOPY WITH PROPOFOL N/A 11/10/2019   Procedure: COLONOSCOPY WITH PROPOFOL;  Surgeon: Toledo, Benay Pike, MD;  Location: [facility] ENDOSCOPY;  Service: Gastroenterology;  Laterality: N/A;   CYSTOSCOPY W/ RETROGRADES Bilateral 11/26/2021   Procedure: CYSTOSCOPY WITH RETROGRADE PYELOGRAM;  Surgeon: Hollice Espy, MD;  Location: [facility] ORS;  Service: Urology;  Laterality: Bilateral;   CYSTOSCOPY WITH BIOPSY N/A 11/26/2021   Procedure: CYSTOSCOPY WITH BLADDER BIOPSY;  Surgeon: Hollice Espy, MD;  Location: [facility] ORS;  Service: Urology;  Laterality: N/A;   FOOT SURGERY Left    HAND SURGERY Right    IR RADIOLOGIST EVAL & MGMT  11/08/2021   KYPHOPLASTY N/A 07/11/2016   Procedure: LUMBAR 2 KYPHOPLASTY;  Surgeon: Phylliss Bob, MD;  Location: [facility];  Service: Orthopedics;  Laterality: N/A;  LUMBAR 2 KYPHOPLASTY   KYPHOPLASTY  2021   LAMINECTOMY  ~1998    Soc Hx- married, 2 daughters. Retired Copywriter, advertising   reports that he has quit smoking. His smoking use included cigarettes. He has never used smokeless tobacco. He reports that he does not currently use alcohol. He reports that he does not use drugs.  Allergies  Allergen Reactions   Iodinated Contrast Media Shortness Of Breath and Rash   Flexeril [Cyclobenzaprine]     Intolerant- sedation.     Iodine Other (See Comments)  Unknown   Tramadol Other (See Comments)    Ineffective for pain.    Vioxx [Rofecoxib] Other (See Comments)    LFT elevation   Metformin And Related Other (See Comments)    Muscle cramps   Valium [Diazepam] Rash    Family History  Problem Relation Age of Onset   Breast cancer Mother    Alzheimer's disease Mother    Breast cancer Sister    Stroke Father    Colon cancer Neg Hx    Prostate cancer Neg Hx     Prior to Admission medications   Medication Sig Start Date End Date Taking? Authorizing Provider  furosemide (LASIX) 40 MG tablet Take 1  tablet (40 mg total) by mouth daily. Take extra 40 mg for weight gain of 3 lbs in 1 day or 5 lbs 1 week 12/21/21  Yes Tonia Ghent, MD  Krill Oil 1000 MG CAPS Take 2,000 mg by mouth daily.   Yes [provider]  lactulose (CHRONULAC) 10 GM/15ML solution TAKE 15 MLS (10 GRAMS) BY MOUTH 2 TIMES DAILY AS NEEDED Patient taking differently: Take 10 g by mouth 2 (two) times daily. 10/30/21  Yes Tonia Ghent, MD  pantoprazole (PROTONIX) 40 MG tablet TAKE 1 TABLET BY MOUTH ONCE A DAY 11/19/21  Yes Tonia Ghent, MD  XARELTO 20 MG TABS tablet TAKE 1 TABLET BY MOUTH ONCE DAILY WITH SUPPER 08/27/21  Yes Tonia Ghent, MD  Continuous Blood Gluc Receiver ([place]) [place] by Does not apply route.    [provider]  Continuous Blood Gluc Sensor (DEXCOM G7 SENSOR) MISC Apply sensor every 10 days    [provider]  Dulaglutide (TRULICITY) 1.61 WR/6.0AV SOPN Inject 0.75 mg into the skin once a week. 11/09/21   Tonia Ghent, MD  fluticasone Asencion Islam) 50 MCG/ACT nasal spray Place 2 sprays into both nostrils daily. Patient not taking: Reported on 12/27/2021 09/25/21   Lavina Hamman, MD  oxybutynin (DITROPAN) 5 MG tablet Take 1 tablet (5 mg total) by mouth every 8 (eight) hours as needed for bladder spasms. Patient not taking: Reported on 12/27/2021 11/26/21   Hollice Espy, MD  oxyCODONE-acetaminophen (PERCOCET) 10-325 MG tablet Take 1 tablet by mouth every 6 (six) hours as needed for pain (Take 30-60 minutes prior to planned dressing change). 10/16/21 10/16/22  Tonia Ghent, MD    Physical Exam: Vitals:   01/06/22 1530 01/06/22 1610 01/06/22 1611 01/06/22 1613  BP: (!) 119/54 128/70  128/70  Pulse: 64  (!) 58 66  Resp:    19  Temp:    98.5 F (36.9 C)  TempSrc:    Oral  SpO2: 98%  100% 100%  Weight:      Height:        Physical Exam Vitals and nursing note reviewed.  Constitutional:      General: He is not in acute distress.    Appearance: He is obese.   HENT:     Head: Normocephalic and atraumatic.  Eyes:     General: Scleral icterus present.     Extraocular Movements: Extraocular movements intact.     Conjunctiva/sclera: Conjunctivae normal.     Pupils: Pupils are equal, round, and reactive to light.  Cardiovascular:     Rate and Rhythm: Normal rate and regular rhythm.     Heart sounds: No murmur heard.    Comments: 2+ radial, 1+ DP Pulmonary:     Effort: Pulmonary effort is normal.  Breath sounds: Normal breath sounds.  Abdominal:     General: There is no distension.     Palpations: Abdomen is soft. There is no mass.     Tenderness: There is no abdominal tenderness.     Comments: Protuberant, question of shifting dullness. Obesity hinders palpation for HSM  Musculoskeletal:        General: Normal range of motion.     Cervical back: Normal range of motion and neck supple.     Right lower leg: Edema present.  Skin:    General: Skin is warm.     Coloration: Skin is not jaundiced.     Comments: Right leg from m,id-calf to ankle is erythematous, hot to touch, tender. There is a 2 cm shallow indentation over the shin. No frankly open or draining wound  Neurological:     Mental Status: He is alert and oriented to person, place, and time.     Cranial Nerves: No cranial nerve deficit.  Psychiatric:        Mood and Affect: Mood normal.        Behavior: Behavior normal.      Labs on Admission: I have personally reviewed following labs and imaging studies  CBC: Recent Labs  Lab 01/03/22 1040 01/06/22 1237  WBC 3.2* 4.8  NEUTROABS 1.5* 3.1  HGB 9.0* 8.6*  HCT 28.9* 28.2*  MCV 93.5 92.5  PLT 155 132*   Basic Metabolic Panel: Recent Labs  Lab 01/03/22 1040 01/06/22 1237  NA 141 136  K 4.4 3.7  CL 115* 109  CO2 23 22  GLUCOSE 206* 189*  BUN 19 17  CREATININE 0.78 1.02  CALCIUM 9.0 8.3*   GFR: Estimated Creatinine Clearance: 95.7 mL/min (by C-G formula based on SCr of 1.02 mg/dL). Liver Function Tests: Recent  Labs  Lab 01/03/22 1040 01/06/22 1237  AST 42* 41  ALT 30 25  ALKPHOS 209* 180*  BILITOT 1.5* 1.3*  PROT 5.7* 5.7*  ALBUMIN 2.8* 2.7*   No results for input(s): "LIPASE", "AMYLASE" in the last 168 hours. Recent Labs  Lab 01/06/22 1317  AMMONIA 57*   Coagulation Profile: Recent Labs  Lab 01/06/22 1317  INR 2.7*   Cardiac Enzymes: No results for input(s): "CKTOTAL", "CKMB", "CKMBINDEX", "TROPONINI" in the last 168 hours. BNP (last 3 results) No results for input(s): "PROBNP" in the last 8760 hours. HbA1C: No results for input(s): "HGBA1C" in the last 72 hours. CBG: Recent Labs  Lab 01/03/22 1024  GLUCAP 209*   Lipid Profile: No results for input(s): "CHOL", "HDL", "LDLCALC", "TRIG", "CHOLHDL", "LDLDIRECT" in the last 72 hours. Thyroid Function Tests: No results for input(s): "TSH", "T4TOTAL", "FREET4", "T3FREE", "THYROIDAB" in the last 72 hours. Anemia Panel: No results for input(s): "VITAMINB12", "FOLATE", "FERRITIN", "TIBC", "IRON", "RETICCTPCT" in the last 72 hours. Urine analysis:    Component Value Date/Time   COLORURINE YELLOW (A) 01/06/2022 1237   APPEARANCEUR CLEAR (A) 01/06/2022 1237   APPEARANCEUR Clear 11/13/2021 1340   LABSPEC 1.012 01/06/2022 1237   PHURINE 5.0 01/06/2022 1237   GLUCOSEU NEGATIVE 01/06/2022 1237   HGBUR NEGATIVE 01/06/2022 1237   BILIRUBINUR NEGATIVE 01/06/2022 1237   BILIRUBINUR Negative 11/13/2021 1340   KETONESUR NEGATIVE 01/06/2022 1237   PROTEINUR NEGATIVE 01/06/2022 1237   NITRITE NEGATIVE 01/06/2022 1237   LEUKOCYTESUR NEGATIVE 01/06/2022 1237    Radiological Exams on Admission: I have personally reviewed images US Venous Img Lower Unilateral Right  Result Date: 01/06/2022 CLINICAL DATA:  Right lower extremity pain and swelling.  EXAM: RIGHT LOWER EXTREMITY VENOUS DOPPLER ULTRASOUND TECHNIQUE: Gray-scale sonography with compression, as well as color and duplex ultrasound, were performed to evaluate the deep venous  system(s) from the level of the common femoral vein through the popliteal and proximal calf veins. COMPARISON:  None Available. FINDINGS: VENOUS Normal compressibility of the common femoral, superficial femoral, and popliteal veins, as well as the visualized calf veins. Visualized portions of profunda femoral vein and great saphenous vein unremarkable. No filling defects to suggest DVT on grayscale or color Doppler imaging. Doppler waveforms show normal direction of venous flow, normal respiratory plasticity and response to augmentation. Limited views of the contralateral common femoral vein are unremarkable. OTHER Fluid collection within the right popliteal fossa measures 5 x 1.0 x 3.4 cm (volume = 9 cm^3). Limitations: none IMPRESSION: 1. No evidence for deep venous thrombosis. 2. Right popliteal fossa Baker's cyst. Electronically Signed   By: Kerby Moors M.D.   On: 01/06/2022 14:32    EKG: I have personally reviewed EKG: no EKG this admission  Assessment/Plan Active Problems:   Cellulitis of right lower extremity   Uncontrolled type 2 diabetes mellitus with hypoglycemia, without long-term current use of insulin (HCC)   Cirrhosis ([place])   Hepatocellular carcinoma (HCC)    Assessment and Plan: Cellulitis of right lower extremity Patient presents with a red, hot and tender right distal LE c/w cellulitis. He is diabetic. In ED received Cefepime 2g and Vanco. No fever, no leukocytosis  Plan Dose with Rocephin 1 gr IV 01/07/22  If cellulitis stable or improved planto d/c 01/08/22 on oral equivalent  Uncontrolled type 2 diabetes mellitus with hypoglycemia, without long-term current use of insulin (HCC) Last A1C 5.8% 10/29/21. Takes trulicity weekly but has been told to not dose in advance of ablation procedure for 01/09/22  Plan SS coverage  Hepatocellular carcinoma ([place]) 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 Is schedule for microwave ablation by IR. He will be going to [facility] for  transplant evaluation.  Plan No need for additional workup this admission  Hold trulicity  Stop Xarelto after dose 01/06/22  Cirrhosis ([place]) Long term chronic problem: due to NAFLD. Mild encephalopathy per family with somnolence and mild confusion. Ammonia 57  Plan Increase  Lactulose to TID  Supportive care       DVT prophylaxis: Xarelto Code Status: Full Code Family Communication: wife and daughter present  Disposition Plan: home 24-48 hrs  Consults called: none  Admission status: Observation, Med-Surg   Adella Hare, MD [facility] 01/06/2022, 6:10 PM

## 2022-01-06 NOTE — Assessment & Plan Note (Signed)
Patient presents with a red, hot and tender right distal LE c/w cellulitis. He is diabetic. In ED received Cefepime 2g and Vanco. No fever, no leukocytosis  Plan Dose with Rocephin 1 gr IV 01/07/22  If cellulitis stable or improved planto d/c 01/08/22 on oral equivalent

## 2022-01-06 NOTE — Assessment & Plan Note (Addendum)
Last A1C 5.8% 10/29/21. Takes trulicity weekly but has been told to not dose in advance of ablation procedure for 01/09/22  Plan SS coverage

## 2022-01-06 NOTE — Subjective & Objective (Signed)
Tim Hodge is a 66 y.o. 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  States is having some itching over the past few days scratched it yesterday and started noticing increasing redness discoloration with worsening tightness and discomfort

## 2022-01-06 NOTE — Assessment & Plan Note (Addendum)
Long term chronic problem: due to NAFLD. Mild encephalopathy per family with somnolence and mild confusion. Ammonia 57  Plan Increase  Lactulose to TID  Supportive care

## 2022-01-06 NOTE — ED Triage Notes (Signed)
Pt recently diagnosed with liver failure, liver cancer, pt takes fluid pills and blood thinners, pt states he has been taking them. Pt with c/o bilateral leg swelling, right leg worse than left. Pt with red and weeping right calf, gash on back of calf where pt was scratching. Pt states leg itches. Right leg with swelling as well, no redness noted on right leg.

## 2022-01-06 NOTE — Consult Note (Signed)
PHARMACY -  BRIEF ANTIBIOTIC NOTE   Pharmacy has received consult(s) for cefepime and vancomycin from an ED provider.  The patient's profile has been reviewed for ht/wt/allergies/indication/available labs.    One time order(s) placed for cefepime 2 g x 1 and vancomycin 1,000 mg x 1  Further antibiotics/pharmacy consults should be ordered by admitting physician if indicated.                       Thank you, Dara Hoyer, PharmD PGY-1 Pharmacy Resident 01/06/2022 2:48 PM

## 2022-01-06 NOTE — ED Provider Notes (Signed)
[facility] Provider Note    Event Date/Time   First MD Initiated Contact with Patient 01/06/22 1248     (approximate)   History   Leg Swelling   HPI  Tim Hodge is a 66 y.o. 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  States is having some itching over the past few days scratched it yesterday and started noticing increasing redness discoloration with worsening tightness and discomfort.  Family did give an extra diuretic this morning and feels like it has gotten a little bit better but overall it is still quite tight and causing the patient discomfort.     Physical Exam   Triage Vital Signs: ED Triage Vitals  Enc Vitals Group     BP 01/06/22 1230 (!) 120/58     Pulse Rate 01/06/22 1230 62     Resp 01/06/22 1230 16     Temp 01/06/22 1230 98.4 F (36.9 C)     Temp Source 01/06/22 1230 Oral     SpO2 01/06/22 1230 100 %     Weight 01/06/22 1233 260 lb (117.9 kg)     Height 01/06/22 1233 6' (1.829 m)     Head Circumference --      Peak Flow --      Pain Score 01/06/22 1230 2     Pain Loc --      Pain Edu? --      Excl. in [place]? --     Most recent vital signs: Vitals:   01/06/22 1230  BP: (!) 120/58  Pulse: 62  Resp: 16  Temp: 98.4 F (36.9 C)  SpO2: 100%     Constitutional: Alert  Eyes: Conjunctivae are normal.  Head: Atraumatic. Nose: No congestion/rhinnorhea. Mouth/Throat: Mucous membranes are moist.   Neck: Painless ROM.  Cardiovascular:   Good peripheral circulation. Respiratory: Normal respiratory effort.  No retractions.  Gastrointestinal: Soft and nontender.  Musculoskeletal: Greater than left pitting edema.  Erythema warmth to the mid leg.  No crepitus.  Small superficial abrasion to the posterior aspect of the right leg.  No purulence. Neurologic:   MAE spontaneously. No gross focal neurologic deficits are appreciated.  Skin:  Skin is warm, dry and intact. No rash noted. Psychiatric: Mood and affect are normal. Speech and behavior are normal.    ED Results / Procedures / Treatments   Labs (all labs ordered are listed, but only abnormal results are displayed) Labs Reviewed  LACTIC ACID, PLASMA - Abnormal; Notable for the following components:      Result Value   Lactic Acid, Venous 2.1 (*)    All other components within normal limits  COMPREHENSIVE METABOLIC PANEL - Abnormal; Notable for the following components:   Glucose, Bld 189 (*)    Calcium 8.3 (*)    Total Protein 5.7 (*)    Albumin 2.7 (*)    Alkaline Phosphatase 180 (*)    Total Bilirubin 1.3 (*)    All other components within normal limits  CBC WITH DIFFERENTIAL/PLATELET - Abnormal; Notable for the following components:   RBC 3.05 (*)    Hemoglobin 8.6 (*)    HCT 28.2 (*)    Platelets 108 (*)    All other components within normal limits  URINALYSIS, ROUTINE W REFLEX MICROSCOPIC - Abnormal; Notable for the following components:   Color, Urine  YELLOW (*)    APPearance CLEAR (*)    All other components within normal limits  PROTIME-INR - Abnormal; Notable for the following components:   Prothrombin Time 28.4 (*)    INR 2.7 (*)    All other components within normal limits  AMMONIA - Abnormal; Notable for the following components:   Ammonia 57 (*)    All other components within normal limits  LACTIC ACID, PLASMA     EKG     RADIOLOGY Please see ED Course for my review and interpretation.  I personally reviewed all radiographic images ordered to evaluate for the above acute complaints and reviewed radiology reports and findings.  These findings were personally discussed with the patient.  Please see medical record for radiology report.    PROCEDURES:  Critical Care performed: No  Procedures   MEDICATIONS ORDERED IN ED: Medications  lactated  ringers infusion (has no administration in time range)  ceFEPIme (MAXIPIME) 2 g in sodium chloride 0.9 % 100 mL IVPB (has no administration in time range)  vancomycin (VANCOCIN) IVPB 1000 mg/200 mL premix (has no administration in time range)  lactulose (CHRONULAC) 10 GM/15ML solution 30 g (has no administration in time range)     IMPRESSION / MDM / ASSESSMENT AND PLAN / ED COURSE  I reviewed the triage vital signs and the nursing notes.                              Differential diagnosis includes, but is not limited to, DVT, cellulitis, abscess, abrasion, CHF, cirrhosis  Patient presented to the ER for evaluation of symptoms as described above.  This presenting complaint could reflect a potentially life-threatening illness therefore the patient will be placed on continuous pulse oximetry and telemetry for monitoring.  Laboratory evaluation will be sent to evaluate for the above complaints.      Clinical Course as of 01/06/22 1451  Sun Jan 06, 2022  1444 Ultrasound without evidence of DVT on my review and interpretation.  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  Lactate is elevated no white count no fever.  May be secondary to his underlying cirrhosis but given the extent of his cellulitic changes with no MRSA infection in the past we will give IV antibiotics particular in the setting of his known diabetes.  Will consult hospitalist for admission. [PR]    Clinical Course User Index [PR] Merlyn Lot, MD    FINAL CLINICAL IMPRESSION(S) / ED DIAGNOSES   Final diagnoses:  Cellulitis of right lower extremity  Encephalopathy, hepatic ([place])     Rx / DC Orders   ED Discharge Orders     None        Note:  This document was prepared using Dragon voice recognition software and may include unintentional dictation errors.    Merlyn Lot, MD 01/06/22 1451

## 2022-01-06 NOTE — Assessment & Plan Note (Addendum)
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 Is schedule for microwave ablation by IR. He will be going to [facility] for transplant evaluation.  Plan No need for additional workup this admission  Hold trulicity  Stop Xarelto after dose 01/06/22

## 2022-01-07 ENCOUNTER — Telehealth: Payer: Self-pay | Admitting: Obstetrics and Gynecology

## 2022-01-07 DIAGNOSIS — E669 Obesity, unspecified: Secondary | ICD-10-CM

## 2022-01-07 DIAGNOSIS — L03115 Cellulitis of right lower limb: Secondary | ICD-10-CM | POA: Diagnosis not present

## 2022-01-07 LAB — CBC
HCT: 26.8 % — ABNORMAL LOW (ref 39.0–52.0)
Hemoglobin: 8.1 g/dL — ABNORMAL LOW (ref 13.0–17.0)
MCH: 27.6 pg (ref 26.0–34.0)
MCHC: 30.2 g/dL (ref 30.0–36.0)
MCV: 91.5 fL (ref 80.0–100.0)
Platelets: 108 10*3/uL — ABNORMAL LOW (ref 150–400)
RBC: 2.93 MIL/uL — ABNORMAL LOW (ref 4.22–5.81)
RDW: 13.9 % (ref 11.5–15.5)
WBC: 3.8 10*3/uL — ABNORMAL LOW (ref 4.0–10.5)
nRBC: 0 % (ref 0.0–0.2)

## 2022-01-07 LAB — GLUCOSE, CAPILLARY: Glucose-Capillary: 133 mg/dL — ABNORMAL HIGH (ref 70–99)

## 2022-01-07 MED ORDER — DOXYCYCLINE HYCLATE 100 MG PO CAPS: 100.0000 mg | ORAL_CAPSULE | Freq: Two times a day (BID) | ORAL | 0 refills | Status: DC

## 2022-01-07 NOTE — Discharge Summary (Signed)
Tim Hodge DQQ:[id number] DOB: [birth year] DOA: 01/06/2022  PCP: Tonia Ghent, MD  Admit date: 01/06/2022 Discharge date: 01/07/2022  Time spent: 35 minutes  Recommendations for Outpatient Follow-up:  Pcp f/u few days if possible     Discharge Diagnoses:  Principal Problem:   Cellulitis of right anterior lower leg Active Problems:   HTN (hypertension)   Uncontrolled type 2 diabetes mellitus with hypoglycemia, without long-term current use of insulin (HCC)   History of pulmonary embolism   Cirrhosis (HCC)   Chronic diastolic CHF (congestive heart failure) ([place])   Cellulitis of right lower extremity   Hepatocellular carcinoma ([place])   Obesity   Discharge Condition: stable  Diet recommendation: low sodium  Filed Weights   01/06/22 1233  Weight: 117.9 kg    History of present illness:  From admission h and p Tim Hodge is a 66 y.o. 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  States is having some itching over the past few days scratched it yesterday and started noticing increasing redness discoloration with worsening tightness and discomfort   Hospital Course:  Patient presented with cellulitis of right lower extremity (anterior shin) that began 24-48 hours prior to presentation. Had not been treated as an outpatient. No signs sepsis. He was treated with cefepime and vancomycin in the ED and then transitioned to ceftriaxone by the admitting provider. On hospital day 1 patient reports improvement in pain and swelling. Several small superficial pustules in hair follicles noted, culture of those was obtained but because of small sample size am not sure culture will be able to run. Given these signs of purulence will cover for mrsa with doxycycline, with plan to complete total 7 days of antibiotics.  Discharged with return precautions, advising PCP f/u in a few days for wound check. Of note patient has recently-diagnosed [place] in the setting of known cirrhosis, he is scheduled to begin radiation therapy later this week and is establishing with transplant hepatology.  Procedures: none   Consultations: none  Discharge Exam: Vitals:   01/07/22 0829 01/07/22 0900  BP: 110/60 110/60  Pulse: 60 64  Resp: 18 17  Temp: 98.3 F (36.8 C) 98.3 F (36.8 C)  SpO2: 97% 97%    General: NAD Cardiovascular: RRR Respiratory: CTAB Skin: RLE erythema and swelling anterior shin with several small pustules in hair follicles  Discharge Instructions   Discharge Instructions     Diet - low sodium heart healthy   Complete by: As directed    Increase activity slowly   Complete by: As directed       Allergies as of 01/07/2022       Reactions   Iodinated Contrast Media Shortness Of Breath, Rash   Flexeril [cyclobenzaprine]    Intolerant- sedation.     Iodine Other (See Comments)   Unknown   Tramadol Other (See Comments)   Ineffective for pain.    Vioxx [rofecoxib] Other (See Comments)   LFT elevation   Metformin And Related Other (See Comments)   Muscle cramps   Valium [diazepam] Rash        Medication List     TAKE these medications    Dexcom G7 Receiver Devi by Does not apply route.   Dexcom G7 Sensor Misc Apply sensor every 10 days   doxycycline 100 MG capsule Commonly known as: VIBRAMYCIN Take  1 capsule (100 mg total) by mouth 2 (two) times daily. Start taking on: January 08, 2022   fluticasone 50 MCG/ACT nasal spray Commonly known as: FLONASE Place 2 sprays into both nostrils daily.   furosemide 40 MG tablet Commonly known as: Lasix Take 1 tablet (40 mg total) by mouth daily. Take extra 40 mg for weight gain of 3 lbs in 1 day or 5 lbs 1 week   Krill Oil 1000 MG Caps Take 2,000 mg by mouth daily.   lactulose 10 GM/15ML solution Commonly known as:  CHRONULAC TAKE 15 MLS (10 GRAMS) BY MOUTH 2 TIMES DAILY AS NEEDED What changed: See the new instructions.   oxybutynin 5 MG tablet Commonly known as: DITROPAN Take 1 tablet (5 mg total) by mouth every 8 (eight) hours as needed for bladder spasms.   oxyCODONE-acetaminophen 10-325 MG tablet Commonly known as: Percocet Take 1 tablet by mouth every 6 (six) hours as needed for pain (Take 30-60 minutes prior to planned dressing change).   pantoprazole 40 MG tablet Commonly known as: PROTONIX TAKE 1 TABLET BY MOUTH ONCE A DAY   Trulicity 9.14 NW/2.9FA Sopn Generic drug: Dulaglutide Inject 0.75 mg into the skin once a week.   Xarelto 20 MG Tabs tablet Generic drug: rivaroxaban TAKE 1 TABLET BY MOUTH ONCE DAILY WITH SUPPER       Allergies  Allergen Reactions   Iodinated Contrast Media Shortness Of Breath and Rash   Flexeril [Cyclobenzaprine]     Intolerant- sedation.     Iodine Other (See Comments)    Unknown   Tramadol Other (See Comments)    Ineffective for pain.    Vioxx [Rofecoxib] Other (See Comments)    LFT elevation   Metformin And Related Other (See Comments)    Muscle cramps   Valium [Diazepam] Rash    Follow-up Information     Tonia Ghent, MD Follow up.   Specialty: Family Medicine Contact information: [place] [address] [phone]                  The results of significant diagnostics from this hospitalization (including imaging, microbiology, ancillary and laboratory) are listed below for reference.    Significant Diagnostic Studies: US Venous Img Lower Unilateral Right  Result Date: 01/06/2022 CLINICAL DATA:  Right lower extremity pain and swelling. EXAM: RIGHT LOWER EXTREMITY VENOUS DOPPLER ULTRASOUND TECHNIQUE: Gray-scale sonography with compression, as well as color and duplex ultrasound, were performed to evaluate the deep venous system(s) from the level of the common femoral vein through the popliteal and  proximal calf veins. COMPARISON:  None Available. FINDINGS: VENOUS Normal compressibility of the common femoral, superficial femoral, and popliteal veins, as well as the visualized calf veins. Visualized portions of profunda femoral vein and great saphenous vein unremarkable. No filling defects to suggest DVT on grayscale or color Doppler imaging. Doppler waveforms show normal direction of venous flow, normal respiratory plasticity and response to augmentation. Limited views of the contralateral common femoral vein are unremarkable. OTHER Fluid collection within the right popliteal fossa measures 5 x 1.0 x 3.4 cm (volume = 9 cm^3). Limitations: none IMPRESSION: 1. No evidence for deep venous thrombosis. 2. Right popliteal fossa Baker's cyst. Electronically Signed   By: Kerby Moors M.D.   On: 01/06/2022 14:32    Microbiology: No results found for this or any previous visit (from the past 240 hour(s)).   Labs: Basic Metabolic Panel: Recent Labs  Lab 01/03/22 1040 01/06/22 1237  NA 141 136  K 4.4 3.7  CL 115* 109  CO2 23 22  GLUCOSE 206* 189*  BUN 19 17  CREATININE 0.78 1.02  CALCIUM 9.0 8.3*   Liver Function Tests: Recent Labs  Lab 01/03/22 1040 01/06/22 1237  AST 42* 41  ALT 30 25  ALKPHOS 209* 180*  BILITOT 1.5* 1.3*  PROT 5.7* 5.7*  ALBUMIN 2.8* 2.7*   No results for input(s): "LIPASE", "AMYLASE" in the last 168 hours. Recent Labs  Lab 01/06/22 1317  AMMONIA 57*   CBC: Recent Labs  Lab 01/03/22 1040 01/06/22 1237 01/07/22 0653  WBC 3.2* 4.8 3.8*  NEUTROABS 1.5* 3.1  --   HGB 9.0* 8.6* 8.1*  HCT 28.9* 28.2* 26.8*  MCV 93.5 92.5 91.5  PLT 155 108* 108*   Cardiac Enzymes: No results for input(s): "CKTOTAL", "CKMB", "CKMBINDEX", "TROPONINI" in the last 168 hours. BNP: BNP (last 3 results) Recent Labs    09/19/21 1118  BNP 138.9*    ProBNP (last 3 results) No results for input(s): "PROBNP" in the last 8760 hours.  CBG: Recent Labs  Lab 01/03/22 1024  01/06/22 2107 01/07/22 0851  GLUCAP 209* 144* 133*       Signed:  Desma Maxim MD.  [facility] 01/07/2022, 9:18 AM
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
## 2022-01-07 NOTE — Telephone Encounter (Signed)
Called in rx for doxy as pharmacy didn't receive.

## 2022-01-07 NOTE — Plan of Care (Signed)
  Problem: Education: Goal: Ability to describe self-care measures that may prevent or decrease complications (Diabetes Survival Skills Education) will improve Outcome: Progressing Goal: Individualized Educational Video(s) Outcome: Progressing   Problem: Coping: Goal: Ability to adjust to condition or change in health will improve Outcome: Progressing   Problem: Fluid Volume: Goal: Ability to maintain a balanced intake and output will improve Outcome: Progressing   Problem: Health Behavior/Discharge Planning: Goal: Ability to identify and utilize available resources and services will improve Outcome: Progressing Goal: Ability to manage health-related needs will improve Outcome: Progressing   Problem: Metabolic: Goal: Ability to maintain appropriate glucose levels will improve Outcome: Progressing   Problem: Nutritional: Goal: Maintenance of adequate nutrition will improve Outcome: Progressing Goal: Progress toward achieving an optimal weight will improve Outcome: Progressing   Problem: Skin Integrity: Goal: Risk for impaired skin integrity will decrease Outcome: Progressing   Problem: Tissue Perfusion: Goal: Adequacy of tissue perfusion will improve Outcome: Progressing   Problem: Education: Goal: Knowledge of General Education information will improve Description: Including pain rating scale, medication(s)/side effects and non-pharmacologic comfort measures Outcome: Progressing   Problem: Health Behavior/Discharge Planning: Goal: Ability to manage health-related needs will improve Outcome: Progressing   Problem: Clinical Measurements: Goal: Ability to maintain clinical measurements within normal limits will improve Outcome: Progressing Goal: Will remain free from infection Outcome: Progressing Goal: Diagnostic test results will improve Outcome: Progressing Goal: Respiratory complications will improve Outcome: Progressing Goal: Cardiovascular complication will  be avoided Outcome: Progressing   Problem: Activity: Goal: Risk for activity intolerance will decrease Outcome: Progressing   Problem: Nutrition: Goal: Adequate nutrition will be maintained Outcome: Progressing   Problem: Coping: Goal: Level of anxiety will decrease Outcome: Progressing   Problem: Elimination: Goal: Will not experience complications related to bowel motility Outcome: Progressing Goal: Will not experience complications related to urinary retention Outcome: Progressing   Problem: Pain Managment: Goal: General experience of comfort will improve Outcome: Progressing   Problem: Safety: Goal: Ability to remain free from injury will improve Outcome: Progressing   Problem: Skin Integrity: Goal: Risk for impaired skin integrity will decrease Outcome: Progressing   Problem: Clinical Measurements: Goal: Ability to avoid or minimize complications of infection will improve Outcome: Progressing   Problem: Skin Integrity: Goal: Skin integrity will improve Outcome: Progressing

## 2022-01-08 ENCOUNTER — Other Ambulatory Visit: Payer: Self-pay | Admitting: Radiology
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
## 2022-01-08 NOTE — Anesthesia Preprocedure Evaluation (Signed)
Anesthesia Evaluation  Patient identified by MRN, date of birth, ID band Patient awake    Reviewed: Allergy & Precautions, NPO status , Patient's Chart, lab work & pertinent test results  Airway Mallampati: III  TM Distance: <3 FB Neck ROM: Full    Dental  (+) Dental Advisory Given, Partial Lower, Chipped,    Pulmonary former smoker, PE   Pulmonary exam normal breath sounds clear to auscultation       Cardiovascular hypertension, + Peripheral Vascular Disease and +CHF  Normal cardiovascular exam Rhythm:Regular Rate:Normal  Echo 09/19/21: 1. Left ventricular ejection fraction, by estimation, is 60 to 65%. The left ventricle has normal function. The left ventricle has no regional wall motion abnormalities. Left ventricular diastolic parameters were normal.  2. Right ventricular systolic function is normal. The right ventricular size is normal.  3. Left atrial size was mildly dilated.  4. Right atrial size was mildly dilated.  5. The mitral valve is normal in structure. Trivial mitral valve regurgitation. No evidence of mitral stenosis.  6. The aortic valve is normal in structure. Aortic valve regurgitation is not visualized. No aortic stenosis is present.  7. The inferior vena cava is normal in size with greater than 50% respiratory variability, suggesting right atrial pressure of 3 mmHg.   Neuro/Psych  Headaches,    GI/Hepatic GERD  Medicated,(+) Cirrhosis       , HEPATOCELLULAR CARCINOMA   Endo/Other  diabetes, Type 2  Renal/GU negative Renal ROS     Musculoskeletal negative musculoskeletal ROS (+)   Abdominal   Peds  Hematology  (+) Blood dyscrasia (Thrombocytopenia; Xarelto), anemia ,   Anesthesia Other Findings   Reproductive/Obstetrics                          Anesthesia Physical Anesthesia Plan  ASA: 3  Anesthesia Plan: General   Post-op Pain Management: Minimal or no pain  anticipated   Induction: Intravenous  PONV Risk Score and Plan: 2 and Midazolam, Dexamethasone and Ondansetron  Airway Management Planned: Oral ETT  Additional Equipment:   Intra-op Plan:   Post-operative Plan: Extubation in OR  Informed Consent: I have reviewed the patients History and Physical, chart, labs and discussed the procedure including the risks, benefits and alternatives for the proposed anesthesia with the patient or authorized representative who has indicated his/her understanding and acceptance.     Dental advisory given  Plan Discussed with: CRNA  Anesthesia Plan Comments:        Anesthesia Quick Evaluation

## 2022-01-08 NOTE — H&P (Signed)
  The note originally documented on this encounter has been moved the the encounter in which it belongs.  

## 2022-01-09 ENCOUNTER — Encounter (HOSPITAL_COMMUNITY): Payer: Self-pay

## 2022-01-09 ENCOUNTER — Ambulatory Visit (HOSPITAL_COMMUNITY): Payer: Medicare Other | Admitting: Physician Assistant

## 2022-01-09 ENCOUNTER — Ambulatory Visit (HOSPITAL_COMMUNITY)
Admission: RE | Admit: 2022-01-09 | Discharge: 2022-01-09 | Disposition: A | Discharge status: Home / Self Care | Payer: Medicare Other | Source: Ambulatory Visit | Attending: Interventional Radiology | Admitting: Interventional Radiology

## 2022-01-09 ENCOUNTER — Encounter (HOSPITAL_COMMUNITY)
Admission: RE | Disposition: A | Discharge status: Home / Self Care | Payer: Self-pay | Source: Home / Self Care | Attending: Interventional Radiology

## 2022-01-09 ENCOUNTER — Encounter (HOSPITAL_COMMUNITY): Payer: Self-pay | Admitting: Interventional Radiology

## 2022-01-09 ENCOUNTER — Other Ambulatory Visit: Payer: Self-pay | Admitting: Student

## 2022-01-09 ENCOUNTER — Ambulatory Visit (HOSPITAL_COMMUNITY)
Admission: RE | Admit: 2022-01-09 | Discharge: 2022-01-09 | Disposition: A | Discharge status: Home / Self Care | Payer: Medicare Other | Attending: Interventional Radiology | Admitting: Interventional Radiology

## 2022-01-09 ENCOUNTER — Ambulatory Visit (HOSPITAL_BASED_OUTPATIENT_CLINIC_OR_DEPARTMENT_OTHER): Payer: Medicare Other | Admitting: Anesthesiology

## 2022-01-09 ENCOUNTER — Ambulatory Visit (HOSPITAL_COMMUNITY): Payer: Medicare Other

## 2022-01-09 DIAGNOSIS — E1151 Type 2 diabetes mellitus with diabetic peripheral angiopathy without gangrene: Secondary | ICD-10-CM

## 2022-01-09 DIAGNOSIS — Z7901 Long term (current) use of anticoagulants: Secondary | ICD-10-CM | POA: Diagnosis not present

## 2022-01-09 DIAGNOSIS — I10 Essential (primary) hypertension: Secondary | ICD-10-CM

## 2022-01-09 DIAGNOSIS — D630 Anemia in neoplastic disease: Secondary | ICD-10-CM | POA: Diagnosis not present

## 2022-01-09 DIAGNOSIS — I110 Hypertensive heart disease with heart failure: Secondary | ICD-10-CM | POA: Insufficient documentation

## 2022-01-09 DIAGNOSIS — C227 Other specified carcinomas of liver: Secondary | ICD-10-CM | POA: Diagnosis not present

## 2022-01-09 DIAGNOSIS — R160 Hepatomegaly, not elsewhere classified: Secondary | ICD-10-CM

## 2022-01-09 DIAGNOSIS — I509 Heart failure, unspecified: Secondary | ICD-10-CM | POA: Insufficient documentation

## 2022-01-09 DIAGNOSIS — Z7985 Long-term (current) use of injectable non-insulin antidiabetic drugs: Secondary | ICD-10-CM

## 2022-01-09 DIAGNOSIS — I739 Peripheral vascular disease, unspecified: Secondary | ICD-10-CM | POA: Diagnosis not present

## 2022-01-09 DIAGNOSIS — C220 Liver cell carcinoma: Secondary | ICD-10-CM | POA: Diagnosis present

## 2022-01-09 DIAGNOSIS — K7460 Unspecified cirrhosis of liver: Secondary | ICD-10-CM | POA: Insufficient documentation

## 2022-01-09 DIAGNOSIS — Z87891 Personal history of nicotine dependence: Secondary | ICD-10-CM | POA: Diagnosis not present

## 2022-01-09 DIAGNOSIS — Z01818 Encounter for other preprocedural examination: Secondary | ICD-10-CM | POA: Diagnosis not present

## 2022-01-09 HISTORY — PX: RADIOLOGY WITH ANESTHESIA: SHX6223

## 2022-01-09 LAB — GLUCOSE, CAPILLARY
Glucose-Capillary: 224 mg/dL — ABNORMAL HIGH (ref 70–99)
Glucose-Capillary: 228 mg/dL — ABNORMAL HIGH (ref 70–99)
Glucose-Capillary: 196 mg/dL — ABNORMAL HIGH (ref 70–99)

## 2022-01-09 LAB — PROTIME-INR
INR: 1.2 (ref 0.8–1.2)
Prothrombin Time: 14.9 seconds (ref 11.4–15.2)

## 2022-01-09 LAB — TYPE AND SCREEN
ABO/RH(D): A POS
Antibody Screen: NEGATIVE

## 2022-01-09 LAB — CBC
HCT: 31.4 % — ABNORMAL LOW (ref 39.0–52.0)
Hemoglobin: 9.9 g/dL — ABNORMAL LOW (ref 13.0–17.0)
MCH: 28.7 pg (ref 26.0–34.0)
MCHC: 31.5 g/dL (ref 30.0–36.0)
MCV: 91 fL (ref 80.0–100.0)
Platelets: 130 10*3/uL — ABNORMAL LOW (ref 150–400)
RBC: 3.45 MIL/uL — ABNORMAL LOW (ref 4.22–5.81)
RDW: 13.6 % (ref 11.5–15.5)
WBC: 2.1 10*3/uL — ABNORMAL LOW (ref 4.0–10.5)
nRBC: 0 % (ref 0.0–0.2)

## 2022-01-09 SURGERY — CT WITH ANESTHESIA
Anesthesia: General

## 2022-01-09 MED ORDER — SODIUM CHLORIDE 0.9 % IV SOLN
2.0000 g | Freq: Once | INTRAVENOUS | Status: AC
  Administered 2022-01-09: 2 g via INTRAVENOUS
  Filled 2022-01-09: qty 2

## 2022-01-09 MED ORDER — PROPOFOL 10 MG/ML IV BOLUS
INTRAVENOUS | Status: DC | PRN
  Administered 2022-01-09: 150 mg via INTRAVENOUS

## 2022-01-09 MED ORDER — FENTANYL CITRATE (PF) 100 MCG/2ML IJ SOLN
INTRAMUSCULAR | Status: DC | PRN
  Administered 2022-01-09 (×2): 50 ug via INTRAVENOUS

## 2022-01-09 MED ORDER — ONDANSETRON HCL 4 MG/2ML IJ SOLN
INTRAMUSCULAR | Status: DC | PRN
  Administered 2022-01-09: 4 mg via INTRAVENOUS

## 2022-01-09 MED ORDER — SODIUM CHLORIDE (PF) 0.9 % IJ SOLN
INTRAMUSCULAR | Status: AC
  Filled 2022-01-09: qty 100

## 2022-01-09 MED ORDER — ORAL CARE MOUTH RINSE: 15.0000 mL | Freq: Once | OROMUCOSAL | Status: AC

## 2022-01-09 MED ORDER — LACTATED RINGERS IV SOLN: INTRAVENOUS | Status: DC

## 2022-01-09 MED ORDER — INSULIN ASPART 100 UNIT/ML IJ SOLN
INTRAMUSCULAR | Status: AC
  Filled 2022-01-09: qty 1

## 2022-01-09 MED ORDER — LIDOCAINE 2% (20 MG/ML) 5 ML SYRINGE
INTRAMUSCULAR | Status: DC | PRN
  Administered 2022-01-09: 80 mg via INTRAVENOUS

## 2022-01-09 MED ORDER — IOHEXOL 350 MG/ML SOLN
100.0000 mL | Freq: Once | INTRAVENOUS | Status: AC | PRN
  Administered 2022-01-09: 100 mL via INTRAVENOUS

## 2022-01-09 MED ORDER — SENNOSIDES-DOCUSATE SODIUM 8.6-50 MG PO TABS: 1.0000 | ORAL_TABLET | Freq: Every day | ORAL | Status: DC | PRN

## 2022-01-09 MED ORDER — FENTANYL CITRATE PF 50 MCG/ML IJ SOSY: 25.0000 ug | PREFILLED_SYRINGE | INTRAMUSCULAR | Status: DC | PRN

## 2022-01-09 MED ORDER — MIDAZOLAM HCL 2 MG/2ML IJ SOLN
INTRAMUSCULAR | Status: AC
  Filled 2022-01-09: qty 2

## 2022-01-09 MED ORDER — MIDAZOLAM HCL 5 MG/5ML IJ SOLN
INTRAMUSCULAR | Status: DC | PRN
  Administered 2022-01-09: 2 mg via INTRAVENOUS

## 2022-01-09 MED ORDER — ONDANSETRON HCL 4 MG/2ML IJ SOLN: 4.0000 mg | Freq: Four times a day (QID) | INTRAMUSCULAR | Status: DC | PRN

## 2022-01-09 MED ORDER — DEXAMETHASONE SODIUM PHOSPHATE 10 MG/ML IJ SOLN
INTRAMUSCULAR | Status: DC | PRN
  Administered 2022-01-09: 10 mg via INTRAVENOUS

## 2022-01-09 MED ORDER — INSULIN ASPART 100 UNIT/ML IJ SOLN
6.0000 [IU] | Freq: Once | INTRAMUSCULAR | Status: AC
  Administered 2022-01-09: 6 [IU] via SUBCUTANEOUS

## 2022-01-09 MED ORDER — PROMETHAZINE HCL 25 MG/ML IJ SOLN: 6.2500 mg | INTRAMUSCULAR | Status: DC | PRN

## 2022-01-09 MED ORDER — FENTANYL CITRATE (PF) 250 MCG/5ML IJ SOLN
INTRAMUSCULAR | Status: AC
  Filled 2022-01-09: qty 5

## 2022-01-09 MED ORDER — ROCURONIUM BROMIDE 10 MG/ML (PF) SYRINGE
PREFILLED_SYRINGE | INTRAVENOUS | Status: DC | PRN
  Administered 2022-01-09: 40 mg via INTRAVENOUS
  Administered 2022-01-09: 60 mg via INTRAVENOUS

## 2022-01-09 MED ORDER — DOCUSATE SODIUM 100 MG PO CAPS: 100.0000 mg | ORAL_CAPSULE | Freq: Two times a day (BID) | ORAL | Status: DC

## 2022-01-09 MED ORDER — CHLORHEXIDINE GLUCONATE 0.12 % MT SOLN
15.0000 mL | Freq: Once | OROMUCOSAL | Status: AC
  Administered 2022-01-09: 15 mL via OROMUCOSAL
  Filled 2022-01-09: qty 15

## 2022-01-09 MED ORDER — SODIUM CHLORIDE 0.9 % IV SOLN
INTRAVENOUS | Status: AC
  Filled 2022-01-09: qty 1000

## 2022-01-09 MED ORDER — HYDROCODONE-ACETAMINOPHEN 5-325 MG PO TABS: 1.0000 | ORAL_TABLET | ORAL | Status: DC | PRN

## 2022-01-09 NOTE — Procedures (Addendum)
  Procedure:  CT/US guided R liver lesion core biopsy and Microwave(RF) ablation   Preprocedure diagnosis: Hepatoma Postprocedure diagnosis: same EBL:    minimal Complications:   none immediate  See full dictation in [facility].  Dillard Cannon MD Main # [phone] Pager  [phone] Mobile [phone]

## 2022-01-09 NOTE — Anesthesia Procedure Notes (Addendum)
Procedure Name: Intubation Date/Time: 01/09/2022 9:03 AM  Performed by: Gean Maidens, CRNAPre-anesthesia Checklist: Patient identified, Emergency Drugs available, Suction available, Patient being monitored and Timeout performed Patient Re-evaluated:Patient Re-evaluated prior to induction Oxygen Delivery Method: Circle system utilized Preoxygenation: Pre-oxygenation with 100% oxygen Induction Type: IV induction Ventilation: Mask ventilation without difficulty Laryngoscope Size: 4 and Glidescope Grade View: Grade I Tube type: Oral Tube size: 7.5 mm Number of attempts: 2 Airway Equipment and Method: Stylet Placement Confirmation: ETT inserted through vocal cords under direct vision, positive ETCO2 and breath sounds checked- equal and bilateral Secured at: 23 cm Tube secured with: Tape Dental Injury: Teeth and Oropharynx as per pre-operative assessment

## 2022-01-09 NOTE — Discharge Instructions (Signed)
Microwave ablation of Hepatic Tumors, Care After  This sheet gives you information about how to care for yourself after your procedure. Your health care provider may also give you more specific instructions. If you have problems or questions, contact your health care provider.  What can I expect after the procedure? After your procedure, it is common to have pain and discomfort in the upper abdomen. You may be given prescription pain medicine to control this if the pain is severe.  Follow these instructions at home: Puncture site care Follow instructions from your health care provider about how to take care of your incision. Make sure you:  - Wash your hands with soap and water before you change your bandage (dressing). If soap and water are not available, use hand sanitizer.  - Ok to shower 48 hours following procedure. Recommend showering with bandage on, remove bandage immediately after showering and pat area dry. No further dressing changes needed after this- ensure area remains clean and dry until fully healed.  - No submerging (swimming, bathing) for 7 days post-procedure. Check your incision area every day for signs of infection. Check for: - Redness, swelling, or pain. - Fluid or blood. - Warmth. - Pus or a bad smell. Activity Rest as often as needing during the first few days of recovery. No stooping, bending, or lifting more than 10 pounds for 1 week.  General instructions To prevent or treat constipation while you are taking prescription pain medicine, your health care provider may recommend that you: - Drink enough fluid to keep your urine clear or pale yellow. - Take over-the-counter or prescription medicines. - Eat foods that are high in fiber, such as fresh fruits and vegetables, whole grains, and beans. - Limit foods that are high in fat and processed sugars, such as fried and sweet foods. - Do not use any products that contain nicotine or tobacco, such as cigarettes and  e-cigarettes. If you need help quitting, ask your health care provider. - Keep all follow-up visits as told by your health care provider. This is important.  3-4 week televisit with the doctor who performed the procedure. Our office will call you to set up the appointment.    Contact a health care provider if: You cannot pass gas. You are unable to have a bowel movement within 3 days. You have a skin rash.  Get help right away if: You have a fever. You have severe or lasting pain in your abdomen, shoulder, or back. You have trouble swallowing or breathing. You have severe weakness or dizziness. You have chest pain or shortness of breath.   This information is not intended to replace advice given to you by your health care provider. Make sure you discuss any questions you have with your health care provider.

## 2022-01-09 NOTE — Transfer of Care (Signed)
Immediate Anesthesia Transfer of Care Note  Patient: Tim Hodge  Procedure(s) Performed: CT MICROWAVE ABLATION  Patient Location: PACU  Anesthesia Type:General  Level of Consciousness: awake, alert  and oriented  Airway & Oxygen Therapy: Patient Spontanous Breathing and Patient connected to face mask oxygen  Post-op Assessment: Report given to RN and Post -op Vital signs reviewed and stable  Post vital signs: Reviewed and stable  Last Vitals:  Vitals Value Taken Time  BP 150/82 01/09/22 1120  Temp    Pulse 70 01/09/22 1122  Resp 15 01/09/22 1122  SpO2 98 % 01/09/22 1122  Vitals shown include unvalidated device data.  Last Pain:  Vitals:   01/09/22 0752  TempSrc:   PainSc: 0-No pain         Complications: No notable events documented.

## 2022-01-09 NOTE — Discharge Summary (Signed)
Patient ID: Tim Hodge MRN: [id number] DOB/AGE: 11/28/1955 66 y.o.  Admit date: 01/09/2022 Discharge date: 01/09/2022  Supervising Physician: Arne Cleveland  Patient Status: [facility] - In-pt  Admission Diagnoses: hepatic lesions   Discharge Diagnoses:  Principal Problem:   Hepatoma [facility])   Discharged Condition: good  Hospital Course:   Patient presented to [facility] IR for an image-guided hepatic masses biopsy microwave ablation with Dr. Vernard Gambles.  The mass on the right side was treated successfully with microwave ablation; the mass on the left side was not well visualized under CT/US, no treatment was performed. Procedure occurred without major complications and patient was transferred to floor in stable condition (VSS, puncture site stable.)  Patient seen with Dr. Vernard Gambles around 1500 hrs.    Patient awake and alert, no complaints at this time. Spouse and daughter at bedside. Dr. Vernard Gambles explained why the left hepatic mass could not be treated, he informed the patient and family members that  he will discuss tx options for the left hepatic mass once once biopsy result, anticipating next early next week.  Patient states that he is feeling fine, no pain, n/v, wants to go home.   Plan on d/c today, foley catheter to be removed.   Consults: None  Significant Diagnostic Studies: none   Treatments: Microwave ablation   Discharge Exam: Blood pressure 135/72, pulse 60, temperature (!) 97 F (36.1 C), resp. rate 16, height 6' (1.829 m), weight 259 lb 14.8 oz (117.9 kg), SpO2 100 %.  Physical Exam Vitals and nursing note reviewed.  Constitutional:      General: Patient is not in acute distress.    Appearance: Normal appearance. Patient is not ill-appearing.  HENT:     Head: Normocephalic and atraumatic.  Cardiovascular:     Rate and Rhythm: Normal rate and regular rhythm.     Pulses: Normal pulses.     Heart sounds: Normal heart sounds.  Pulmonary:     Effort: Pulmonary effort  is normal.     Breath sounds: Normal breath sounds.  Musculoskeletal:     Cervical back: Neck supple.  Skin:    General: Skin is warm and dry.     Coloration: Skin is not jaundiced or pale.   Positive dressing on right upper back/flank puncture site. Site is unremarkable with no erythema, edema, tenderness, bleeding or drainage. Minimal amount of old, dry blood noted on the dressing. Dressing otherwise clean, dry, and intact.   Neurological:     Mental Status: Patient is alert and oriented to person, place, and time.  Psychiatric:        Mood and Affect: Mood normal.        Behavior: Behavior normal.        Judgment: Judgment normal.    Disposition: Discharge disposition: 01-Home or Self Care       Discharge Instructions     Call MD for:   Complete by: As directed    Call MD for:  difficulty breathing, headache or visual disturbances   Complete by: As directed    Call MD for:  extreme fatigue   Complete by: As directed    Call MD for:  hives   Complete by: As directed    Call MD for:  persistant dizziness or light-headedness   Complete by: As directed    Call MD for:  persistant nausea and vomiting   Complete by: As directed    Call MD for:  redness, tenderness, or signs of infection (pain,  swelling, redness, odor or green/yellow discharge around incision site)   Complete by: As directed    Call MD for:  severe uncontrolled pain   Complete by: As directed    Call MD for:  temperature >100.4   Complete by: As directed    Diet - low sodium heart healthy   Complete by: As directed    Discharge wound care:   Complete by: As directed    Keep right back puncture site dry and clean until fully heals, may place a Band-aid. Do not submerge (swimming, sitting is a bath tub) for 7 days. OK to take shower starting 01/10/22.   Increase activity slowly   Complete by: As directed       Allergies as of 01/09/2022       Reactions   Iodinated Contrast Media Shortness Of Breath,  Rash   Flexeril [cyclobenzaprine]    Intolerant- sedation.     Iodine Other (See Comments)   Unknown   Tramadol Other (See Comments)   Ineffective for pain.    Vioxx [rofecoxib] Other (See Comments)   LFT elevation   Metformin And Related Other (See Comments)   Muscle cramps   Valium [diazepam] Rash        Medication List     TAKE these medications    Dexcom G7 Receiver Devi by Does not apply route.   Dexcom G7 Sensor Misc Apply sensor every 10 days   doxycycline 100 MG capsule Commonly known as: VIBRAMYCIN Take 1 capsule (100 mg total) by mouth 2 (two) times daily.   fluticasone 50 MCG/ACT nasal spray Commonly known as: FLONASE Place 2 sprays into both nostrils daily.   furosemide 40 MG tablet Commonly known as: Lasix Take 1 tablet (40 mg total) by mouth daily. Take extra 40 mg for weight gain of 3 lbs in 1 day or 5 lbs 1 week   Krill Oil 1000 MG Caps Take 2,000 mg by mouth daily.   lactulose 10 GM/15ML solution Commonly known as: CHRONULAC TAKE 15 MLS (10 GRAMS) BY MOUTH 2 TIMES DAILY AS NEEDED What changed: See the new instructions.   oxybutynin 5 MG tablet Commonly known as: DITROPAN Take 1 tablet (5 mg total) by mouth every 8 (eight) hours as needed for bladder spasms.   oxyCODONE-acetaminophen 10-325 MG tablet Commonly known as: Percocet Take 1 tablet by mouth every 6 (six) hours as needed for pain (Take 30-60 minutes prior to planned dressing change).   pantoprazole 40 MG tablet Commonly known as: PROTONIX TAKE 1 TABLET BY MOUTH ONCE A DAY   Trulicity 8.54 OE/7.0JJ Sopn Generic drug: Dulaglutide Inject 0.75 mg into the skin once a week.   Xarelto 20 MG Tabs tablet Generic drug: rivaroxaban TAKE 1 TABLET BY MOUTH ONCE DAILY WITH SUPPER               Discharge Care Instructions  (From admission, onward)           Start     Ordered   01/09/22 0000  Discharge wound care:       Comments: Keep right back puncture site dry and clean  until fully heals, may place a Band-aid. Do not submerge (swimming, sitting is a bath tub) for 7 days. OK to take shower starting 01/10/22.   01/09/22 1529            Follow-up Information     Arne Cleveland, MD Follow up.   Specialties: Interventional Radiology, Radiology Why: Our schedulers will call  you to set up the appointment for in person consultation visit to discuss the lesion that could not be treated on 01/09/22. If you have questions about your appointment, please call [phone]. Contact information: [address] [place] [address] [phone]                  Electronically Signed: Tera Mater, PA-C 01/09/2022, 3:31 PM   I have spent Less Than 30 Minutes discharging Tim Hodge.

## 2022-01-09 NOTE — Sedation Documentation (Signed)
Anesthesia in to sedate and monitor. 

## 2022-01-10 ENCOUNTER — Encounter (HOSPITAL_COMMUNITY): Payer: Self-pay | Admitting: Interventional Radiology

## 2022-01-10 LAB — AEROBIC CULTURE W GRAM STAIN (SUPERFICIAL SPECIMEN): Gram Stain: NONE SEEN

## 2022-01-10 NOTE — Anesthesia Postprocedure Evaluation (Signed)
Anesthesia Post Note  Patient: Tim Hodge  Procedure(s) Performed: CT MICROWAVE ABLATION     Patient location during evaluation: PACU Anesthesia Type: General Level of consciousness: awake and alert Pain management: pain level controlled Vital Signs Assessment: post-procedure vital signs reviewed and stable Respiratory status: spontaneous breathing, nonlabored ventilation, respiratory function stable and patient connected to nasal cannula oxygen Cardiovascular status: blood pressure returned to baseline and stable Postop Assessment: no apparent nausea or vomiting Anesthetic complications: no   No notable events documented.  Last Vitals:  Vitals:   01/09/22 1200 01/09/22 1231  BP: 134/86 135/72  Pulse: 61 60  Resp: 19 16  Temp:    SpO2: 98% 100%    Last Pain:  Vitals:   01/09/22 1145  TempSrc:   PainSc: 0-No pain                 Santa Lighter

## 2022-01-11 ENCOUNTER — Encounter: Payer: Self-pay | Admitting: Family Medicine

## 2022-01-11 ENCOUNTER — Telehealth: Payer: Self-pay | Admitting: Family Medicine

## 2022-01-11 ENCOUNTER — Ambulatory Visit (INDEPENDENT_AMBULATORY_CARE_PROVIDER_SITE_OTHER): Payer: Medicare Other | Admitting: Family Medicine

## 2022-01-11 DIAGNOSIS — M549 Dorsalgia, unspecified: Secondary | ICD-10-CM

## 2022-01-11 DIAGNOSIS — R160 Hepatomegaly, not elsewhere classified: Secondary | ICD-10-CM | POA: Diagnosis not present

## 2022-01-11 DIAGNOSIS — E11649 Type 2 diabetes mellitus with hypoglycemia without coma: Secondary | ICD-10-CM | POA: Diagnosis not present

## 2022-01-11 DIAGNOSIS — G8929 Other chronic pain: Secondary | ICD-10-CM | POA: Diagnosis not present

## 2022-01-11 DIAGNOSIS — L03115 Cellulitis of right lower limb: Secondary | ICD-10-CM

## 2022-01-11 MED ORDER — SULFAMETHOXAZOLE-TRIMETHOPRIM 800-160 MG PO TABS: 1.0000 | ORAL_TABLET | Freq: Two times a day (BID) | ORAL | 0 refills | Status: DC

## 2022-01-11 MED ORDER — SACCHAROMYCES BOULARDII 250 MG PO CAPS: 250.0000 mg | ORAL_CAPSULE | Freq: Every day | ORAL | Status: DC

## 2022-01-11 MED ORDER — OXYCODONE-ACETAMINOPHEN 10-325 MG PO TABS: 1.0000 | ORAL_TABLET | Freq: Four times a day (QID) | ORAL | 0 refills | Status: DC | PRN

## 2022-01-11 MED ORDER — LACTULOSE 10 GM/15ML PO SOLN: 10.0000 g | Freq: Two times a day (BID) | ORAL | Status: DC

## 2022-01-11 NOTE — Telephone Encounter (Signed)
Dr. Chauncey Cruel from [facility] called in and was wanting to give you a heads up on Doug's biopsy. She stated she can be reached at [phone] when ever you are free to call her. Thank you!

## 2022-01-11 NOTE — Telephone Encounter (Signed)
Called Dr. Chauncey Cruel.  The full report isn't in EMR yet but she reported the patient has hepatocellular carcinoma.  I called pt and wife about dx, which was prev suspected.  The plan remains the same from [place] today.  Support offered, he had no questions now and he will follow up with [facility] as planned.  I thanked him for taking the call.  I thank all involved.

## 2022-01-11 NOTE — Patient Instructions (Addendum)
Stop doxy, continue florastor. Start septra.  Let me know if your leg is getting worse or if the diarrhea doesn't get better.  Keep taking florastor.  Take care.  Glad to see you. Update me about your sugar next week.

## 2022-01-11 NOTE — Progress Notes (Unsigned)
   Patient presented to [facility] IR for an image-guided hepatic masses biopsy microwave ablation with Dr. Vernard Gambles.  The mass on the right side was treated successfully with microwave ablation; the mass on the left side was not well visualized under CT/US, no treatment was performed. Procedure occurred without major complications and patient was transferred to floor in stable condition (VSS, puncture site stable.)   He has f/u at [facility] next week.     Diarrhea on doxy, d/w pt skin culture.  MRSA.    Recently restarted trulicity.  Had a dose yesterday.    Taking '40mg'$  lasix usually daily.  Rarely with 2nd doses (ie about once a week).  He has sensation of cold feeling.  Unclear if this is med related.    Using oxycodone rarely for back pain.  D/w pt about routine cautions.    Sugar has been in the 200s recently.

## 2022-01-12 DIAGNOSIS — E1165 Type 2 diabetes mellitus with hyperglycemia: Secondary | ICD-10-CM | POA: Diagnosis not present

## 2022-01-14 NOTE — Assessment & Plan Note (Signed)
See following phone note, with [place] confirmed but path report not yet finalized in the EMR.  Pathologist called me about this.  I called patient.  He has follow-up at [facility] pending.

## 2022-01-14 NOTE — Assessment & Plan Note (Signed)
Clearly improved but with diarrhea on doxycycline.  Discussed changing to Septra based on sensitivity report.

## 2022-01-14 NOTE — Assessment & Plan Note (Signed)
Continue Trulicity.  He will let me know if his sugar and diarrhea do not get better.  At this point still okay for outpatient follow-up.

## 2022-01-14 NOTE — Assessment & Plan Note (Signed)
Continue as needed oxycodone, he uses this on a limited basis.

## 2022-01-15 ENCOUNTER — Inpatient Hospital Stay: Payer: Medicare Other | Admitting: Family Medicine

## 2022-01-15 DIAGNOSIS — Z01810 Encounter for preprocedural cardiovascular examination: Secondary | ICD-10-CM | POA: Diagnosis not present

## 2022-01-15 DIAGNOSIS — Z7901 Long term (current) use of anticoagulants: Secondary | ICD-10-CM | POA: Diagnosis not present

## 2022-01-15 DIAGNOSIS — Z86718 Personal history of other venous thrombosis and embolism: Secondary | ICD-10-CM | POA: Diagnosis not present

## 2022-01-15 DIAGNOSIS — Z01818 Encounter for other preprocedural examination: Secondary | ICD-10-CM | POA: Diagnosis not present

## 2022-01-15 DIAGNOSIS — Z23 Encounter for immunization: Secondary | ICD-10-CM | POA: Diagnosis not present

## 2022-01-15 DIAGNOSIS — I444 Left anterior fascicular block: Secondary | ICD-10-CM | POA: Diagnosis not present

## 2022-01-15 DIAGNOSIS — E1169 Type 2 diabetes mellitus with other specified complication: Secondary | ICD-10-CM | POA: Diagnosis not present

## 2022-01-15 DIAGNOSIS — I493 Ventricular premature depolarization: Secondary | ICD-10-CM | POA: Diagnosis not present

## 2022-01-15 DIAGNOSIS — K766 Portal hypertension: Secondary | ICD-10-CM | POA: Diagnosis not present

## 2022-01-15 DIAGNOSIS — R9431 Abnormal electrocardiogram [ECG] [EKG]: Secondary | ICD-10-CM | POA: Diagnosis not present

## 2022-01-15 DIAGNOSIS — E782 Mixed hyperlipidemia: Secondary | ICD-10-CM | POA: Diagnosis not present

## 2022-01-15 DIAGNOSIS — R0602 Shortness of breath: Secondary | ICD-10-CM | POA: Diagnosis not present

## 2022-01-15 DIAGNOSIS — E119 Type 2 diabetes mellitus without complications: Secondary | ICD-10-CM | POA: Diagnosis not present

## 2022-01-15 DIAGNOSIS — Z86711 Personal history of pulmonary embolism: Secondary | ICD-10-CM | POA: Diagnosis not present

## 2022-01-15 DIAGNOSIS — Z79899 Other long term (current) drug therapy: Secondary | ICD-10-CM | POA: Diagnosis not present

## 2022-01-15 DIAGNOSIS — K7460 Unspecified cirrhosis of liver: Secondary | ICD-10-CM | POA: Diagnosis not present

## 2022-01-15 DIAGNOSIS — R918 Other nonspecific abnormal finding of lung field: Secondary | ICD-10-CM | POA: Diagnosis not present

## 2022-01-15 DIAGNOSIS — R600 Localized edema: Secondary | ICD-10-CM | POA: Diagnosis not present

## 2022-01-15 DIAGNOSIS — R0609 Other forms of dyspnea: Secondary | ICD-10-CM | POA: Diagnosis not present

## 2022-01-15 DIAGNOSIS — G8929 Other chronic pain: Secondary | ICD-10-CM | POA: Diagnosis not present

## 2022-01-15 DIAGNOSIS — I10 Essential (primary) hypertension: Secondary | ICD-10-CM | POA: Diagnosis not present

## 2022-01-15 DIAGNOSIS — Z01811 Encounter for preprocedural respiratory examination: Secondary | ICD-10-CM | POA: Diagnosis not present

## 2022-01-15 DIAGNOSIS — Z87891 Personal history of nicotine dependence: Secondary | ICD-10-CM | POA: Diagnosis not present

## 2022-01-15 DIAGNOSIS — I2510 Atherosclerotic heart disease of native coronary artery without angina pectoris: Secondary | ICD-10-CM | POA: Diagnosis not present

## 2022-01-15 DIAGNOSIS — C220 Liver cell carcinoma: Secondary | ICD-10-CM | POA: Diagnosis not present

## 2022-01-15 DIAGNOSIS — K760 Fatty (change of) liver, not elsewhere classified: Secondary | ICD-10-CM | POA: Diagnosis not present

## 2022-01-15 DIAGNOSIS — M549 Dorsalgia, unspecified: Secondary | ICD-10-CM | POA: Diagnosis not present

## 2022-01-16 DIAGNOSIS — G8929 Other chronic pain: Secondary | ICD-10-CM | POA: Diagnosis not present

## 2022-01-16 DIAGNOSIS — Z86718 Personal history of other venous thrombosis and embolism: Secondary | ICD-10-CM | POA: Diagnosis not present

## 2022-01-16 DIAGNOSIS — M549 Dorsalgia, unspecified: Secondary | ICD-10-CM | POA: Diagnosis not present

## 2022-01-16 DIAGNOSIS — Z87891 Personal history of nicotine dependence: Secondary | ICD-10-CM | POA: Diagnosis not present

## 2022-01-16 DIAGNOSIS — E1169 Type 2 diabetes mellitus with other specified complication: Secondary | ICD-10-CM | POA: Diagnosis not present

## 2022-01-16 DIAGNOSIS — Z23 Encounter for immunization: Secondary | ICD-10-CM | POA: Diagnosis not present

## 2022-01-16 DIAGNOSIS — Z01810 Encounter for preprocedural cardiovascular examination: Secondary | ICD-10-CM | POA: Diagnosis not present

## 2022-01-16 DIAGNOSIS — I10 Essential (primary) hypertension: Secondary | ICD-10-CM | POA: Diagnosis not present

## 2022-01-16 DIAGNOSIS — C220 Liver cell carcinoma: Secondary | ICD-10-CM | POA: Diagnosis not present

## 2022-01-16 DIAGNOSIS — K766 Portal hypertension: Secondary | ICD-10-CM | POA: Diagnosis not present

## 2022-01-16 DIAGNOSIS — I493 Ventricular premature depolarization: Secondary | ICD-10-CM | POA: Diagnosis not present

## 2022-01-16 DIAGNOSIS — Z7682 Awaiting organ transplant status: Secondary | ICD-10-CM | POA: Diagnosis not present

## 2022-01-16 DIAGNOSIS — K7460 Unspecified cirrhosis of liver: Secondary | ICD-10-CM | POA: Diagnosis not present

## 2022-01-16 DIAGNOSIS — I82409 Acute embolism and thrombosis of unspecified deep veins of unspecified lower extremity: Secondary | ICD-10-CM | POA: Diagnosis not present

## 2022-01-16 DIAGNOSIS — M545 Low back pain, unspecified: Secondary | ICD-10-CM | POA: Diagnosis not present

## 2022-01-16 DIAGNOSIS — E782 Mixed hyperlipidemia: Secondary | ICD-10-CM | POA: Diagnosis not present

## 2022-01-16 DIAGNOSIS — Z86711 Personal history of pulmonary embolism: Secondary | ICD-10-CM | POA: Diagnosis not present

## 2022-01-16 DIAGNOSIS — Z01818 Encounter for other preprocedural examination: Secondary | ICD-10-CM | POA: Diagnosis not present

## 2022-01-16 DIAGNOSIS — I2510 Atherosclerotic heart disease of native coronary artery without angina pectoris: Secondary | ICD-10-CM | POA: Diagnosis not present

## 2022-01-16 DIAGNOSIS — Z01811 Encounter for preprocedural respiratory examination: Secondary | ICD-10-CM | POA: Diagnosis not present

## 2022-01-16 DIAGNOSIS — Z7901 Long term (current) use of anticoagulants: Secondary | ICD-10-CM | POA: Diagnosis not present

## 2022-01-16 LAB — SURGICAL PATHOLOGY

## 2022-01-17 DIAGNOSIS — I10 Essential (primary) hypertension: Secondary | ICD-10-CM | POA: Diagnosis not present

## 2022-01-17 DIAGNOSIS — I493 Ventricular premature depolarization: Secondary | ICD-10-CM | POA: Diagnosis not present

## 2022-01-17 DIAGNOSIS — Z01818 Encounter for other preprocedural examination: Secondary | ICD-10-CM | POA: Diagnosis not present

## 2022-01-17 DIAGNOSIS — R0609 Other forms of dyspnea: Secondary | ICD-10-CM | POA: Diagnosis not present

## 2022-01-17 DIAGNOSIS — J8410 Pulmonary fibrosis, unspecified: Secondary | ICD-10-CM | POA: Diagnosis not present

## 2022-01-17 DIAGNOSIS — K766 Portal hypertension: Secondary | ICD-10-CM | POA: Diagnosis not present

## 2022-01-17 DIAGNOSIS — Z7901 Long term (current) use of anticoagulants: Secondary | ICD-10-CM | POA: Diagnosis not present

## 2022-01-17 DIAGNOSIS — Z01810 Encounter for preprocedural cardiovascular examination: Secondary | ICD-10-CM | POA: Diagnosis not present

## 2022-01-17 DIAGNOSIS — I2510 Atherosclerotic heart disease of native coronary artery without angina pectoris: Secondary | ICD-10-CM | POA: Diagnosis not present

## 2022-01-17 DIAGNOSIS — K7460 Unspecified cirrhosis of liver: Secondary | ICD-10-CM | POA: Diagnosis not present

## 2022-01-17 DIAGNOSIS — I82409 Acute embolism and thrombosis of unspecified deep veins of unspecified lower extremity: Secondary | ICD-10-CM | POA: Diagnosis not present

## 2022-01-17 DIAGNOSIS — E1169 Type 2 diabetes mellitus with other specified complication: Secondary | ICD-10-CM | POA: Diagnosis not present

## 2022-01-17 DIAGNOSIS — Z01811 Encounter for preprocedural respiratory examination: Secondary | ICD-10-CM | POA: Diagnosis not present

## 2022-01-17 DIAGNOSIS — I351 Nonrheumatic aortic (valve) insufficiency: Secondary | ICD-10-CM | POA: Diagnosis not present

## 2022-01-17 DIAGNOSIS — G8929 Other chronic pain: Secondary | ICD-10-CM | POA: Diagnosis not present

## 2022-01-17 DIAGNOSIS — E782 Mixed hyperlipidemia: Secondary | ICD-10-CM | POA: Diagnosis not present

## 2022-01-17 DIAGNOSIS — Z87891 Personal history of nicotine dependence: Secondary | ICD-10-CM | POA: Diagnosis not present

## 2022-01-17 DIAGNOSIS — K760 Fatty (change of) liver, not elsewhere classified: Secondary | ICD-10-CM | POA: Diagnosis not present

## 2022-01-17 DIAGNOSIS — C220 Liver cell carcinoma: Secondary | ICD-10-CM | POA: Diagnosis not present

## 2022-01-17 DIAGNOSIS — M549 Dorsalgia, unspecified: Secondary | ICD-10-CM | POA: Diagnosis not present

## 2022-01-18 ENCOUNTER — Other Ambulatory Visit: Payer: Self-pay | Admitting: Family Medicine

## 2022-01-18 ENCOUNTER — Telehealth: Payer: Self-pay | Admitting: Family Medicine

## 2022-01-18 ENCOUNTER — Other Ambulatory Visit (INDEPENDENT_AMBULATORY_CARE_PROVIDER_SITE_OTHER): Payer: Medicare Other

## 2022-01-18 DIAGNOSIS — L03115 Cellulitis of right lower limb: Secondary | ICD-10-CM | POA: Diagnosis not present

## 2022-01-18 LAB — BASIC METABOLIC PANEL
BUN: 17 mg/dL (ref 6–23)
CO2: 26 mEq/L (ref 19–32)
Calcium: 8.3 mg/dL — ABNORMAL LOW (ref 8.4–10.5)
Chloride: 104 mEq/L (ref 96–112)
Creatinine, Ser: 1.08 mg/dL (ref 0.40–1.50)
GFR: 71.95 mL/min (ref >60.00)
Glucose, Bld: 195 mg/dL — ABNORMAL HIGH (ref 70–99)
Potassium: 4.5 mEq/L (ref 3.5–5.1)
Sodium: 136 mEq/L (ref 135–145)

## 2022-01-18 NOTE — Telephone Encounter (Signed)
Terri- I put in the order for a f/u [facility].  He had other labs done at outside clinic recently.    Janett Billow, please get update on patient about his leg, his recent sugar readings, and prev diarrhea.  Thanks.

## 2022-01-18 NOTE — Telephone Encounter (Signed)
Noted, please update [place] as needed. I'll sign off his results when I can.  Thanks.

## 2022-01-18 NOTE — Telephone Encounter (Signed)
Spoke with patients wife and she stated his leg is doing much better; sugars are up and down since they have been all over with appointments and other stuff. Diarrhea is getting somewhat better; thinks the abx was not helping the issue. He took his last abx last night. Wife stated they will continue to monitor everything and hopes things continue to improve.

## 2022-01-24 ENCOUNTER — Telehealth: Payer: Self-pay | Admitting: Family Medicine

## 2022-01-24 NOTE — Telephone Encounter (Signed)
Patient wife called in stating that he received a new shipment of his Dulaglutide (TRULICITY) 4.71 GX/2.7HS SOPN. She stated that he is suppose to take .'75mg'$  but he received 3 mg and she had questions regarding this. She stated that she hasn't heard anything regarding his A1C labs as well. Please advise. Thank you!

## 2022-01-25 ENCOUNTER — Other Ambulatory Visit: Payer: Self-pay | Admitting: Family Medicine

## 2022-01-25 MED ORDER — TRULICITY 0.75 MG/0.5ML ~~LOC~~ SOAJ: 0.7500 mg | SUBCUTANEOUS | 1 refills | Status: DC

## 2022-01-25 NOTE — Telephone Encounter (Signed)
Tried to call the mail order pharmacy and was on hold for 10 minutes. Had to hang up. Will have to try call back later.

## 2022-01-25 NOTE — Telephone Encounter (Signed)
Please update patient.  A1c was 7 on [facility] labs from end of September.   I sent the rx for 0.'75mg'$  trulicity in the meantime.  It was prev sent on 11/09/21.   Is there a way to cancel the '3mg'$  rx? I routed this to [facility] in the meantime for input.

## 2022-01-25 NOTE — Addendum Note (Signed)
Addended by: Tonia Ghent on: 01/25/2022 07:05 AM   Modules accepted: Orders

## 2022-01-25 NOTE — Telephone Encounter (Signed)
Pt's wife called back returning regina's missed call. Pt's wife requested another call back @ [phone].

## 2022-01-25 NOTE — Telephone Encounter (Signed)
Spoke to [place] at [facility] and was advised that they have cancelled out the rx now for trulicity 3 mg.Hebert Soho stated that they have on file Trulicity 4.25 mg and will send out after being processed.  Left a message on voicemail for patient's wife to call the office back.

## 2022-01-28 NOTE — Telephone Encounter (Signed)
Dr. Josefine Class message relayed to patient's wife. Patient's wife stated last week she got another supply from the pharmacy for Trulicity 3 mg. Patient's wife was advised of my conversation with Tia Friday. Patient's wife is going to contact the pharmacy and let them know that her husband is no longer on Trulicity 3 mg which I was informed by Tia that they were aware of that. Patient's wife was advised that I have no idea why they sent him the 3 mg. Patient's wife was advised that Tia stated that they have the 0.75 mg on file now.

## 2022-01-31 DIAGNOSIS — Z01810 Encounter for preprocedural cardiovascular examination: Secondary | ICD-10-CM | POA: Diagnosis not present

## 2022-01-31 DIAGNOSIS — R0609 Other forms of dyspnea: Secondary | ICD-10-CM | POA: Diagnosis not present

## 2022-01-31 DIAGNOSIS — Z01818 Encounter for other preprocedural examination: Secondary | ICD-10-CM | POA: Diagnosis not present

## 2022-02-04 ENCOUNTER — Telehealth: Payer: Self-pay

## 2022-02-04 NOTE — Telephone Encounter (Signed)
Reviewed with Dr. Janese Banks. We will cancel MRI 02/22/22 at [facility].

## 2022-02-05 ENCOUNTER — Ambulatory Visit (INDEPENDENT_AMBULATORY_CARE_PROVIDER_SITE_OTHER): Payer: Medicare Other

## 2022-02-05 VITALS — Ht 72.0 in | Wt 255.0 lb

## 2022-02-05 DIAGNOSIS — Z0000 Encounter for general adult medical examination without abnormal findings: Secondary | ICD-10-CM | POA: Diagnosis not present

## 2022-02-05 NOTE — Progress Notes (Signed)
Subjective:   Tim Hodge is a 66 y.o. male who presents for Medicare Annual/Subsequent preventive examination.  Review of Systems    Virtual Visit via Telephone Note  I connected with  Tim Hodge on 02/05/22 at  9:15 AM EDT by telephone and verified that I am speaking with the correct person using two identifiers.  Location: Patient: Home Provider: Office Persons participating in the virtual visit: patient/Nurse Health Advisor   I discussed the limitations, risks, security and privacy concerns of performing an evaluation and management service by telephone and the availability of in person appointments. The patient expressed understanding and agreed to proceed.  Interactive audio and video telecommunications were attempted between this nurse and patient, however failed, due to patient having technical difficulties OR patient did not have access to video capability.  We continued and completed visit with audio only.  Some vital signs may be absent or patient reported.   Criselda Peaches, LPN  Cardiac Risk Factors include: advanced age (>94mn, >>1women);diabetes mellitus;hypertension;male gender     Objective:    Today's Vitals   02/05/22 0922  Weight: 255 lb (115.7 kg)  Height: 6' (1.829 m)   Body mass index is 34.58 kg/m.     02/05/2022    9:33 AM 01/09/2022    1:00 PM 01/06/2022   12:35 PM 01/03/2022   10:18 AM 11/26/2021   10:53 AM 10/26/2021   10:52 AM 09/19/2021    2:00 AM  Advanced Directives  Does Patient Have a Medical Advance Directive? No No No No No No No  Would patient like information on creating a medical advance directive? No - Patient declined No - Patient declined No - Patient declined  No - Patient declined No - Patient declined No - Patient declined    Current Medications (verified) Outpatient Encounter Medications as of 02/05/2022  Medication Sig   spironolactone (ALDACTONE) 100 MG tablet Take 100 mg by mouth daily.   Continuous Blood Gluc  Receiver ([place] DEVI by Does not apply route.   Continuous Blood Gluc Sensor (DEXCOM G7 SENSOR) MISC Apply sensor every 10 days   Dulaglutide (TRULICITY) 03.29MJJ/8.8CZSOPN Inject 0.75 mg into the skin once a week.   fluticasone (FLONASE) 50 MCG/ACT nasal spray Place 2 sprays into both nostrils daily.   furosemide (LASIX) 40 MG tablet Take 1 tablet (40 mg total) by mouth daily. Take extra 40 mg for weight gain of 3 lbs in 1 day or 5 lbs 1 week   Krill Oil 1000 MG CAPS Take 2,000 mg by mouth daily.   lactulose (CHRONULAC) 10 GM/15ML solution TAKE 15 MLS (10 GRAMS) BY MOUTH 2 TIMES DAILY AS NEEDED   oxyCODONE-acetaminophen (PERCOCET) 10-325 MG tablet Take 1 tablet by mouth every 6 (six) hours as needed for pain (for back pain).   pantoprazole (PROTONIX) 40 MG tablet TAKE 1 TABLET BY MOUTH ONCE A DAY   saccharomyces boulardii (FLORASTOR) 250 MG capsule Take 1 capsule (250 mg total) by mouth daily.   sulfamethoxazole-trimethoprim (BACTRIM DS) 800-160 MG tablet Take 1 tablet by mouth 2 (two) times daily.   XARELTO 20 MG TABS tablet TAKE 1 TABLET BY MOUTH ONCE DAILY WITH SUPPER   No facility-administered encounter medications on file as of 02/05/2022.    Allergies (verified) Iodinated contrast media, Flexeril [cyclobenzaprine], Iodine, Tramadol, Vioxx [rofecoxib], Metformin and related, and Valium [diazepam]   History: Past Medical History:  Diagnosis Date   Acute venous embolism and thrombosis of unspecified  deep vessels of lower extremity    Cancer (HCC)    CHF (congestive heart failure) (HCC)    Cirrhosis of liver (HCC)    due to fatty liver   Compression fx, lumbar spine ([place])    2017   Elevated blood pressure reading without diagnosis of hypertension    Headache    Lumbago    Peripheral vascular disease (HCC)    Polyp of colon    Pulmonary embolism (HCC)    Type II or unspecified type diabetes mellitus without mention of complication, not stated as uncontrolled     Unspecified arthropathy, ankle and foot    Past Surgical History:  Procedure Laterality Date   COLONOSCOPY WITH PROPOFOL N/A 11/10/2019   Procedure: COLONOSCOPY WITH PROPOFOL;  Surgeon: Toledo, Benay Pike, MD;  Location: [facility];  Service: Gastroenterology;  Laterality: N/A;   CYSTOSCOPY W/ RETROGRADES Bilateral 11/26/2021   Procedure: CYSTOSCOPY WITH RETROGRADE PYELOGRAM;  Surgeon: Hollice Espy, MD;  Location: [facility] ORS;  Service: Urology;  Laterality: Bilateral;   CYSTOSCOPY WITH BIOPSY N/A 11/26/2021   Procedure: CYSTOSCOPY WITH BLADDER BIOPSY;  Surgeon: Hollice Espy, MD;  Location: [facility] ORS;  Service: Urology;  Laterality: N/A;   FOOT SURGERY Left    HAND SURGERY Right    IR RADIOLOGIST EVAL & MGMT  11/08/2021   KYPHOPLASTY N/A 07/11/2016   Procedure: LUMBAR 2 KYPHOPLASTY;  Surgeon: Phylliss Bob, MD;  Location: [facility];  Service: Orthopedics;  Laterality: N/A;  LUMBAR 2 KYPHOPLASTY   KYPHOPLASTY  2021   LAMINECTOMY  ~1998   RADIOLOGY WITH ANESTHESIA N/A 01/09/2022   Procedure: CT MICROWAVE ABLATION;  Surgeon: Arne Cleveland, MD;  Location: [facility];  Service: Radiology;  Laterality: N/A;   Family History  Problem Relation Age of Onset   Breast cancer Mother    Alzheimer's disease Mother    Breast cancer Sister    Stroke Father    Colon cancer Neg Hx    Prostate cancer Neg Hx    Social History   Socioeconomic History   Marital status: Married    Spouse name: Not on file   Number of children: Not on file   Years of education: Not on file   Highest education level: Not on file  Occupational History   Not on file  Tobacco Use   Smoking status: Former    Types: Cigarettes   Smokeless tobacco: Never  Vaping Use   Vaping Use: Never used  Substance and Sexual Activity   Alcohol use: Not Currently    Comment: rare   Drug use: No   Sexual activity: Yes  Other Topics Concern   Not on file  Social History Narrative   2 kids, local   [employer], 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   Married 1979   Social Determinants of Radio broadcast assistant Strain: [place]  (02/05/2022)   Overall Financial Resource Strain (CARDIA)    Difficulty of Paying Living Expenses: Not hard at all  Food Insecurity: No Food Insecurity (02/05/2022)   Hunger Vital Sign    Worried About Running Out of Food in the Last Year: Never true    [place] in the Last Year: Never true  Transportation Needs: No Transportation Needs (02/05/2022)   PRAPARE - Hydrologist (Medical): No    Lack of Transportation (Non-Medical): No  Physical Activity: Inactive (02/05/2022)   Exercise Vital Sign    Days of Exercise per Week: 0 days  Minutes of Exercise per Session: 0 min  Stress: No Stress Concern Present (02/05/2022)   [place]    Feeling of Stress : Not at all  Social Connections: Moderately Isolated (02/05/2022)   Social Connection and Isolation Panel [NHANES]    Frequency of Communication with Friends and Family: More than three times a week    Frequency of Social Gatherings with Friends and Family: More than three times a week    Attends Religious Services: Never    Marine scientist or Organizations: No    Attends Music therapist: Never    Marital Status: Married    Tobacco Counseling Counseling given: Not Answered   Clinical Intake:  Pre-visit preparation completed: No Nutrition Risk Assessment:  Has the patient had any N/V/D within the last 2 months?  No  Does the patient have any non-healing wounds?  No  Has the patient had any unintentional weight loss or weight gain?  No   Diabetes:  Is the patient diabetic?  Yes  If diabetic, was a CBG obtained today?  No  Did the patient bring in their glucometer from home?  No  How often do you monitor your CBG's? Dexcom G7 Meter.   Financial Strains and Diabetes Management:  Are you having any  financial strains with the device, your supplies or your medication? No .  Does the patient want to be seen by Chronic Care Management for management of their diabetes?  No  Would the patient like to be referred to a Nutritionist or for Diabetic Management?  No   Diabetic Exams:  Diabetic Eye Exam: Completed No. Overdue for diabetic eye exam. Pt has been advised about the importance in completing this exam. A referral has been placed today. Message sent to referral coordinator for scheduling purposes. Advised pt to expect a call from office referred to regarding appt.  Diabetic Foot Exam: Completed No. Pt has been advised about the importance in completing this exam. Pt is scheduled for diabetic foot exam on Followed by PCP.   Pain : No/denies pain     BMI - recorded: 34.58 Nutritional Status: BMI > 30  Obese Nutritional Risks: None Diabetes: Yes CBG done?: No Did pt. bring in CBG monitor from home?: No  How often do you need to have someone help you when you read instructions, pamphlets, or other written materials from your doctor or pharmacy?: 3 - Sometimes (Wife Assist)  Diabetic?  Yes  Interpreter Needed?: No  Information entered by :: Rolene Arbour LPN   Activities of Daily Living    02/05/2022    9:32 AM 01/09/2022    1:00 PM  In your present state of health, do you have any difficulty performing the following activities:  Hearing? 0 0  Vision? 0 0  Difficulty concentrating or making decisions? 0 0  Walking or climbing stairs? 0 0  Dressing or bathing? 0 0  Doing errands, shopping? 0 0  Preparing Food and eating ? N   Using the Toilet? N   In the past six months, have you accidently leaked urine? N   Do you have problems with loss of bowel control? N   Managing your Medications? N   Managing your Finances? N   Housekeeping or managing your Housekeeping? N     Patient Care Team: Tonia Ghent, MD as PCP - General (Family Medicine) Phylliss Bob, MD as  Consulting Physician (Orthopedic Surgery) Thelma Comp, Lillie (  Optometry) Lenord Fellers, Cleaster Corin, [facility] as Pharmacist (Pharmacist) Clent Jacks, RN as Oncology Nurse Navigator  Indicate any recent Medical Services you may have received from other than [facility] providers in the past year (date may be approximate).     Assessment:   This is a routine wellness examination for [place].  Hearing/Vision screen Hearing Screening - Comments:: Denies hearing difficulties   Vision Screening - Comments:: Wears rx glasses - up to date with routine eye exams with  [facility]  Dietary issues and exercise activities discussed: Current Exercise Habits: The patient does not participate in regular exercise at present, Exercise limited by: None identified   Goals Addressed               This Visit's Progress     No Current Goals (pt-stated)         Depression Screen    02/05/2022    9:31 AM 01/13/2021   11:46 AM 01/13/2021   11:40 AM 03/13/2020    8:00 AM 02/05/2019    8:08 AM 01/09/2016    8:53 AM  PHQ 2/9 Scores  PHQ - 2 Score 0 0 0 0 0 0    Fall Risk    02/05/2022    9:32 AM 11/22/2021   10:49 AM 11/01/2021    3:57 PM 10/25/2021    3:27 PM 01/13/2021   11:46 AM  [place] in the past year? 0 0 0 0 0  Number falls in past yr: 0    0  Injury with Fall? 0    0  Risk for fall due to : No Fall Risks      Follow up Falls prevention discussed        [place]:  Any stairs in or around the home? Yes  If so, are there any without handrails? No  Home free of loose throw rugs in walkways, pet beds, electrical cords, etc? Yes  Adequate lighting in your home to reduce risk of falls? Yes   ASSISTIVE DEVICES UTILIZED TO PREVENT FALLS:  Life alert? No  Use of a cane, walker or w/c? No  Grab bars in the bathroom? Yes Shower chair or bench in shower? No  Elevated toilet seat or a handicapped toilet? Yes   TIMED UP AND GO:  Was the test  performed? No . Audio Visit  Cognitive Function:        02/05/2022    9:33 AM 01/13/2021   11:49 AM  6CIT Screen  What Year? 0 points 0 points  What month? 0 points 0 points  What time? 0 points 0 points  Count back from 20 0 points 0 points  Months in reverse 0 points 4 points  Repeat phrase 10 points 4 points  Total Score 10 points 8 points    Immunizations Immunization History  Administered Date(s) Administered   Influenza,inj,Quad PF,6+ Mos 03/06/2015, 02/21/2017, 01/16/2018, 02/05/2019, 03/13/2020   Influenza-Unspecified 02/03/2014   Moderna Sars-Covid-2 Vaccination 07/21/2019, 08/18/2019, 04/04/2020   Pneumococcal Polysaccharide-23 04/22/2012   Tdap 04/22/2009, 10/07/2014, 09/17/2021    TDAP status: Up to date  Flu Vaccine status: Up to date  Pneumococcal vaccine status: Due, Education has been provided regarding the importance of this vaccine. Advised may receive this vaccine at local pharmacy or Health Dept. Aware to provide a copy of the vaccination record if obtained from local pharmacy or Health Dept. Verbalized acceptance and understanding.  Covid-19 vaccine status: Completed vaccines  Qualifies for Shingles Vaccine? Yes   Zostavax completed No   Shingrix Completed?: No.    Education has been provided regarding the importance of this vaccine. Patient has been advised to call insurance company to determine out of pocket expense if they have not yet received this vaccine. Advised may also receive vaccine at local pharmacy or Health Dept. Verbalized acceptance and understanding.  Screening Tests Health Maintenance  Topic Date Due   Diabetic kidney evaluation - Urine ACR  Never done   COVID-19 Vaccine (4 - Moderna risk series) 02/21/2022 (Originally 05/30/2020)   Zoster Vaccines- Shingrix (1 of 2) 05/08/2022 (Originally 05/29/1974)   Pneumonia Vaccine 66+ Years old (2 - PCV) 02/06/2023 (Originally 04/22/2013)   OPHTHALMOLOGY EXAM  04/25/2022   HEMOGLOBIN A1C   05/01/2022   FOOT EXAM  08/07/2022   Diabetic kidney evaluation - GFR measurement  01/19/2023   COLONOSCOPY (Pts 45-58yr Insurance coverage will need to be confirmed)  11/09/2024   TETANUS/TDAP  09/18/2031   INFLUENZA VACCINE  Completed   Hepatitis C Screening  Completed   HIV Screening  Completed   HPV VACCINES  Aged Out    Health Maintenance  Health Maintenance Due  Topic Date Due   Diabetic kidney evaluation - Urine ACR  Never done    Colorectal cancer screening: Type of screening: Colonoscopy. Completed 11/10/19. Repeat every 5 years  Lung Cancer Screening: (Low Dose CT Chest recommended if Age 65-80years, 30 pack-year currently smoking OR have quit w/in 15years.) does not qualify.    Additional Screening:  Hepatitis C Screening: does qualify; Completed 03/06/15  Vision Screening: Recommended annual ophthalmology exams for early detection of glaucoma and other disorders of the eye. Is the patient up to date with their annual eye exam?  Yes  Who is the provider or what is the name of the office in which the patient attends annual eye exams? [place] is not established with a provider, would they like to be referred to a provider to establish care? No .   Dental Screening: Recommended annual dental exams for proper oral hygiene  Community Resource Referral / Chronic Care Management:  CRR required this visit?  No   CCM required this visit?  No      Plan:     I have personally reviewed and noted the following in the patient's chart:   Medical and social history Use of alcohol, tobacco or illicit drugs  Current medications and supplements including opioid prescriptions. Patient is currently taking opioid prescriptions. Information provided to patient regarding non-opioid alternatives. Patient advised to discuss non-opioid treatment plan with their provider. Functional ability and status Nutritional status Physical activity Advanced  directives List of other physicians Hospitalizations, surgeries, and ER visits in previous 12 months Vitals Screenings to include cognitive, depression, and falls Referrals and appointments  In addition, I have reviewed and discussed with patient certain preventive protocols, quality metrics, and best practice recommendations. A written personalized care plan for preventive services as well as general preventive health recommendations were provided to patient.     BCriselda Peaches LPN   [id number]  Nurse Notes: Patient due Diabetic Kidney Evaluation-Urine ACR

## 2022-02-05 NOTE — Patient Instructions (Addendum)
Tim Hodge , Thank you for taking time to come for your Medicare Wellness Visit. I appreciate your ongoing commitment to your health goals. Please review the following plan we discussed and let me know if I can assist you in the future.   These are the goals we discussed:  Goals       No Current Goals (pt-stated)        This is a list of the screening recommended for you and due dates:  Health Maintenance  Topic Date Due   Yearly kidney health urinalysis for diabetes  Never done   COVID-19 Vaccine (4 - Moderna risk series) 02/21/2022*   Zoster (Shingles) Vaccine (1 of 2) 05/08/2022*   Pneumonia Vaccine (2 - PCV) 02/06/2023*   Eye exam for diabetics  04/25/2022   Hemoglobin A1C  05/01/2022   Complete foot exam   08/07/2022   Yearly kidney function blood test for diabetes  01/19/2023   Colon Cancer Screening  11/09/2024   Tetanus Vaccine  09/18/2031   Flu Shot  Completed   Hepatitis C Screening: USPSTF Recommendation to screen - Ages 18-79 yo.  Completed   HIV Screening  Completed   HPV Vaccine  Aged Out  *Topic was postponed. The date shown is not the original due date.   Opioid Pain Medicine Management Opioids are powerful medicines that are used to treat moderate to severe pain. When used for short periods of time, they can help you to: Sleep better. Do better in physical or occupational therapy. Feel better in the first few days after an injury. Recover from surgery. Opioids should be taken with the supervision of a trained health care provider. They should be taken for the shortest period of time possible. This is because opioids can be addictive, and the longer you take opioids, the greater your risk of addiction. This addiction can also be called opioid use disorder. What are the risks? Using opioid pain medicines for longer than 3 days increases your risk of side effects. Side effects include: Constipation. Nausea and vomiting. Breathing difficulties (respiratory  depression). Drowsiness. Confusion. Opioid use disorder. Itching. Taking opioid pain medicine for a long period of time can affect your ability to do daily tasks. It also puts you at risk for: Motor vehicle crashes. Depression. Suicide. Heart attack. Overdose, which can be life-threatening. What is a pain treatment plan? A pain treatment plan is an agreement between you and your health care provider. Pain is unique to each person, and treatments vary depending on your condition. To manage your pain, you and your health care provider need to work together. To help you do this: Discuss the goals of your treatment, including how much pain you might expect to have and how you will manage the pain. Review the risks and benefits of taking opioid medicines. Remember that a good treatment plan uses more than one approach and minimizes the chance of side effects. Be honest about the amount of medicines you take and about any drug or alcohol use. Get pain medicine prescriptions from only one health care provider. Pain can be managed with many types of alternative treatments. Ask your health care provider to refer you to one or more specialists who can help you manage pain through: Physical or occupational therapy. Counseling (cognitive behavioral therapy). Good nutrition. Biofeedback. Massage. Meditation. Non-opioid medicine. Following a gentle exercise program. How to use opioid pain medicine Taking medicine Take your pain medicine exactly as told by your health care provider. Take it only  when you need it. If your pain gets less severe, you may take less than your prescribed dose if your health care provider approves. If you are not having pain, do nottake pain medicine unless your health care provider tells you to take it. If your pain is severe, do nottry to treat it yourself by taking more pills than instructed on your prescription. Contact your health care provider for help. Write down  the times when you take your pain medicine. It is easy to become confused while on pain medicine. Writing the time can help you avoid overdose. Take other over-the-counter or prescription medicines only as told by your health care provider. Keeping yourself and others safe  While you are taking opioid pain medicine: Do not drive, use machinery, or power tools. Do not sign legal documents. Do not drink alcohol. Do not take sleeping pills. Do not supervise children by yourself. Do not do activities that require climbing or being in high places. Do not go to a lake, river, ocean, spa, or swimming pool. Do not share your pain medicine with anyone. Keep pain medicine in a locked cabinet or in a secure area where pets and children cannot reach it. Stopping your use of opioids If you have been taking opioid medicine for more than a few weeks, you may need to slowly decrease (taper) how much you take until you stop completely. Tapering your use of opioids can decrease your risk of symptoms of withdrawal, such as: Pain and cramping in the abdomen. Nausea. Sweating. Sleepiness. Restlessness. Uncontrollable shaking (tremors). Cravings for the medicine. Do not attempt to taper your use of opioids on your own. Talk with your health care provider about how to do this. Your health care provider may prescribe a step-down schedule based on how much medicine you are taking and how long you have been taking it. Getting rid of leftover pills Do not save any leftover pills. Get rid of leftover pills safely by: Taking the medicine to a prescription take-back program. This is usually offered by the county or law enforcement. Bringing them to a pharmacy that has a drug disposal container. Flushing them down the toilet. Check the label or package insert of your medicine to see whether this is safe to do. Throwing them out in the trash. Check the label or package insert of your medicine to see whether this is  safe to do. If it is safe to throw it out, remove the medicine from the original container, put it into a sealable bag or container, and mix it with used coffee grounds, food scraps, dirt, or cat litter before putting it in the trash. Follow these instructions at home: Activity Do exercises as told by your health care provider. Avoid activities that make your pain worse. Return to your normal activities as told by your health care provider. Ask your health care provider what activities are safe for you. General instructions You may need to take these actions to prevent or treat constipation: Drink enough fluid to keep your urine pale yellow. Take over-the-counter or prescription medicines. Eat foods that are high in fiber, such as beans, whole grains, and fresh fruits and vegetables. Limit foods that are high in fat and processed sugars, such as fried or sweet foods. Keep all follow-up visits. This is important. Where to find support If you have been taking opioids for a long time, you may benefit from receiving support for quitting from a local support group or counselor. Ask your health care  provider for a referral to these resources in your area. Where to find more information Centers for Disease Control and Prevention (CDC): http://www.wolf.info/ [place] Food and Drug Administration (FDA): [place] Get help right away if: You may have taken too much of an opioid (overdosed). Common symptoms of an overdose: Your breathing is slower or more shallow than normal. You have a very slow heartbeat (pulse). You have slurred speech. You have nausea and vomiting. Your pupils become very small. You have other potential symptoms: You are very confused. You faint or feel like you will faint. You have cold, clammy skin. You have blue lips or fingernails. You have thoughts of harming yourself or harming others. These symptoms may represent a serious problem that is an emergency. Do not wait to see if the  symptoms will go away. Get medical help right away. Call your local emergency services (911 in the [place] Do not drive yourself to the hospital.  If you ever feel like you may hurt yourself or others, or have thoughts about taking your own life, get help right away. Go to your nearest emergency department or: Call your local emergency services (911 in the [place] Call the [facility] [phone] in the [place] Call a suicide crisis helpline, such as the [place] at 573-227-1101 or 988 in the [place]. This is open 24 hours a day in the [place] Text the Crisis Text Line at 4060648179 (in the [place].). Summary Opioid medicines can help you manage moderate to severe pain for a short period of time. A pain treatment plan is an agreement between you and your health care provider. Discuss the goals of your treatment, including how much pain you might expect to have and how you will manage the pain. If you think that you or someone else may have taken too much of an opioid, get medical help right away. This information is not intended to replace advice given to you by your health care provider. Make sure you discuss any questions you have with your health care provider. Document Revised: 11/01/2020 Document Reviewed: 07/19/2020 Elsevier Patient Education  [place] directives: Advance directive discussed with you today. Even though you declined this today, please call our office should you change your mind, and we can give you the proper paperwork for you to fill out.   Conditions/risks identified: None  Next appointment: Follow up in one year for your annual wellness visit.    Preventive Care 53 Years and Older, Male  Preventive care refers to lifestyle choices and visits with your health care provider that can promote health and wellness. What does preventive care include? A yearly physical exam. This is also called an annual well  check. Dental exams once or twice a year. Routine eye exams. Ask your health care provider how often you should have your eyes checked. Personal lifestyle choices, including: Daily care of your teeth and gums. Regular physical activity. Eating a healthy diet. Avoiding tobacco and drug use. Limiting alcohol use. Practicing safe sex. Taking low doses of aspirin every day. Taking vitamin and mineral supplements as recommended by your health care provider. What happens during an annual well check? The services and screenings done by your health care provider during your annual well check will depend on your age, overall health, lifestyle risk factors, and family history of disease. Counseling  Your health care provider may ask you questions about your: Alcohol use. Tobacco use. Drug use. Emotional well-being. Home and relationship  well-being. Sexual activity. Eating habits. History of falls. Memory and ability to understand (cognition). Work and work Statistician. Screening  You may have the following tests or measurements: Height, weight, and BMI. Blood pressure. Lipid and cholesterol levels. These may be checked every 5 years, or more frequently if you are over 34 years old. Skin check. Lung cancer screening. You may have this screening every year starting at age 103 if you have a 30-pack-year history of smoking and currently smoke or have quit within the past 15 years. Fecal occult blood test (FOBT) of the stool. You may have this test every year starting at age 83. Flexible sigmoidoscopy or colonoscopy. You may have a sigmoidoscopy every 5 years or a colonoscopy every 10 years starting at age 68. Prostate cancer screening. Recommendations will vary depending on your family history and other risks. Hepatitis C blood test. Hepatitis B blood test. Sexually transmitted disease (STD) testing. Diabetes screening. This is done by checking your blood sugar (glucose) after you have not  eaten for a while (fasting). You may have this done every 1-3 years. Abdominal aortic aneurysm (AAA) screening. You may need this if you are a current or former smoker. Osteoporosis. You may be screened starting at age 83 if you are at high risk. Talk with your health care provider about your test results, treatment options, and if necessary, the need for more tests. Vaccines  Your health care provider may recommend certain vaccines, such as: Influenza vaccine. This is recommended every year. Tetanus, diphtheria, and acellular pertussis (Tdap, Td) vaccine. You may need a Td booster every 10 years. Zoster vaccine. You may need this after age 59. Pneumococcal 13-valent conjugate (PCV13) vaccine. One dose is recommended after age 61. Pneumococcal polysaccharide (PPSV23) vaccine. One dose is recommended after age 70. Talk to your health care provider about which screenings and vaccines you need and how often you need them. This information is not intended to replace advice given to you by your health care provider. Make sure you discuss any questions you have with your health care provider. Document Released: 05/05/2015 Document Revised: 12/27/2015 Document Reviewed: 02/07/2015 Elsevier Interactive Patient Education  2017 [place] Prevention in the Home Falls can cause injuries. They can happen to people of all ages. There are many things you can do to make your home safe and to help prevent falls. What can I do on the outside of my home? Regularly fix the edges of walkways and driveways and fix any cracks. Remove anything that might make you trip as you walk through a door, such as a raised step or threshold. Trim any bushes or trees on the path to your home. Use bright outdoor lighting. Clear any walking paths of anything that might make someone trip, such as rocks or tools. Regularly check to see if handrails are loose or broken. Make sure that both sides of any steps have  handrails. Any raised decks and porches should have guardrails on the edges. Have any leaves, snow, or ice cleared regularly. Use sand or salt on walking paths during winter. Clean up any spills in your garage right away. This includes oil or grease spills. What can I do in the bathroom? Use night lights. Install grab bars by the toilet and in the tub and shower. Do not use towel bars as grab bars. Use non-skid mats or decals in the tub or shower. If you need to sit down in the shower, use a plastic, non-slip stool. Keep the  floor dry. Clean up any water that spills on the floor as soon as it happens. Remove soap buildup in the tub or shower regularly. Attach bath mats securely with double-sided non-slip rug tape. Do not have throw rugs and other things on the floor that can make you trip. What can I do in the bedroom? Use night lights. Make sure that you have a light by your bed that is easy to reach. Do not use any sheets or blankets that are too big for your bed. They should not hang down onto the floor. Have a firm chair that has side arms. You can use this for support while you get dressed. Do not have throw rugs and other things on the floor that can make you trip. What can I do in the kitchen? Clean up any spills right away. Avoid walking on wet floors. Keep items that you use a lot in easy-to-reach places. If you need to reach something above you, use a strong step stool that has a grab bar. Keep electrical cords out of the way. Do not use floor polish or wax that makes floors slippery. If you must use wax, use non-skid floor wax. Do not have throw rugs and other things on the floor that can make you trip. What can I do with my stairs? Do not leave any items on the stairs. Make sure that there are handrails on both sides of the stairs and use them. Fix handrails that are broken or loose. Make sure that handrails are as long as the stairways. Check any carpeting to make sure  that it is firmly attached to the stairs. Fix any carpet that is loose or worn. Avoid having throw rugs at the top or bottom of the stairs. If you do have throw rugs, attach them to the floor with carpet tape. Make sure that you have a light switch at the top of the stairs and the bottom of the stairs. If you do not have them, ask someone to add them for you. What else can I do to help prevent falls? Wear shoes that: Do not have high heels. Have rubber bottoms. Are comfortable and fit you well. Are closed at the toe. Do not wear sandals. If you use a stepladder: Make sure that it is fully opened. Do not climb a closed stepladder. Make sure that both sides of the stepladder are locked into place. Ask someone to hold it for you, if possible. Clearly mark and make sure that you can see: Any grab bars or handrails. First and last steps. Where the edge of each step is. Use tools that help you move around (mobility aids) if they are needed. These include: Canes. Walkers. Scooters. Crutches. Turn on the lights when you go into a dark area. Replace any light bulbs as soon as they burn out. Set up your furniture so you have a clear path. Avoid moving your furniture around. If any of your floors are uneven, fix them. If there are any pets around you, be aware of where they are. Review your medicines with your doctor. Some medicines can make you feel dizzy. This can increase your chance of falling. Ask your doctor what other things that you can do to help prevent falls. This information is not intended to replace advice given to you by your health care provider. Make sure you discuss any questions you have with your health care provider. Document Released: 02/02/2009 Document Revised: 09/14/2015 Document Reviewed: 05/13/2014 Elsevier Interactive Patient  Education  2017 [employer].

## 2022-02-11 ENCOUNTER — Ambulatory Visit
Admission: RE | Admit: 2022-02-11 | Discharge: 2022-02-11 | Disposition: A | Discharge status: Home / Self Care | Payer: Medicare Other | Source: Ambulatory Visit | Attending: Student | Admitting: Student

## 2022-02-11 ENCOUNTER — Other Ambulatory Visit (HOSPITAL_COMMUNITY): Payer: Self-pay | Admitting: Interventional Radiology

## 2022-02-11 ENCOUNTER — Encounter: Payer: Self-pay | Admitting: Lab

## 2022-02-11 DIAGNOSIS — C220 Liver cell carcinoma: Secondary | ICD-10-CM

## 2022-02-11 DIAGNOSIS — E1165 Type 2 diabetes mellitus with hyperglycemia: Secondary | ICD-10-CM | POA: Diagnosis not present

## 2022-02-11 HISTORY — PX: IR RADIOLOGIST EVAL & MGMT: IMG5224
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
## 2022-02-19 ENCOUNTER — Telehealth: Payer: Self-pay

## 2022-02-19 NOTE — Telephone Encounter (Signed)
error 

## 2022-02-21 DIAGNOSIS — J849 Interstitial pulmonary disease, unspecified: Secondary | ICD-10-CM | POA: Diagnosis not present

## 2022-02-22 ENCOUNTER — Other Ambulatory Visit: Payer: Medicare Other

## 2022-02-25 ENCOUNTER — Encounter: Payer: Self-pay | Admitting: Oncology

## 2022-02-25 ENCOUNTER — Inpatient Hospital Stay: Payer: Medicare Other | Attending: Oncology | Admitting: Oncology

## 2022-02-25 ENCOUNTER — Ambulatory Visit
Admission: RE | Admit: 2022-02-25 | Discharge: 2022-02-25 | Disposition: A | Discharge status: Home / Self Care | Payer: Self-pay | Source: Ambulatory Visit | Attending: Oncology | Admitting: Oncology

## 2022-02-25 ENCOUNTER — Other Ambulatory Visit: Payer: Self-pay

## 2022-02-25 VITALS — BP 103/49 | HR 58 | Temp 98.0°F | Resp 16 | Ht 72.0 in | Wt 258.0 lb

## 2022-02-25 DIAGNOSIS — C220 Liver cell carcinoma: Secondary | ICD-10-CM

## 2022-02-25 DIAGNOSIS — R5383 Other fatigue: Secondary | ICD-10-CM | POA: Diagnosis not present

## 2022-02-25 DIAGNOSIS — Z87891 Personal history of nicotine dependence: Secondary | ICD-10-CM | POA: Diagnosis not present

## 2022-02-25 DIAGNOSIS — Z79899 Other long term (current) drug therapy: Secondary | ICD-10-CM | POA: Diagnosis not present

## 2022-02-25 NOTE — Progress Notes (Signed)
01/17/22 MRI abdomen requested from [place].

## 2022-02-25 NOTE — Progress Notes (Signed)
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  He had an MRI in May 2023 which was limited due to motion degradation but there were 2 liver lesions suspected at that time along with macronodular cirrhosis as well as pancreatic cysts.  He had a repeat MRI abdomen in June 2023 which showed 2 distinct liver lesions most 1 was in segment 6 measuring 16 mm and LI-RADS 5.  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  Presence of cirrhosis and portal hypertension.  Splenomegaly of 15 cm.  Cystic lesions in the pancreas largest measuring 2.3 cm in the head and neck of pancreas.  MRI/MRCP in 1 year was recommended for the pancreatic lesions.  Baseline AFP in May 2023 was less than 1.8.  Patient also had a CT chest in May 2023 which did not show any evidence of metastatic disease.  Has ongoing lower extremity swelling as well as ascites for which she is on diuretics.   Patient was not deemed to be a candidate for resection by [place] surgery.  He is being worked up by [facility] for potential transplant candidacy.  Patient underwent microwave ablation to segment 6 lesion.There was another lesion noted in segment 2 which was not visualized well at the time of microwave  ablation.  Patient has seen IR Dr. Vernard Gambles for this with plans to undergo Y90 treatment for the same.  Patient had an MRI abdomen withAnd without contrast 10 cubic in September 2023 which showed no enhancement in the region of previous ablation of segment 6 lesion.  However there was a 2.1 cm and a 1 cm separate focus noted in segment 8 of the liver which was categorized as LI-RADS 4 and 3 respectively.  Patient had CT chest in [place] in September 2023 which did not show any evidence of intrathoracic metastatic disease  Interval history-patient has baseline fatigue.  No recent hospitalizations.  ECOG PS- 2 Pain scale- 0   Review of systems- Review of Systems  Constitutional:  Positive for malaise/fatigue. Negative for chills, fever and weight loss.  HENT:  Negative for congestion, ear discharge and nosebleeds.   Eyes:  Negative for blurred vision.  Respiratory:  Negative for cough, hemoptysis, sputum production, shortness of breath and wheezing.   Cardiovascular:  Positive for leg swelling. Negative for chest pain, palpitations, orthopnea and claudication.  Gastrointestinal:  Negative for abdominal pain, blood in stool, constipation, diarrhea, heartburn, melena, nausea and vomiting.  Genitourinary:  Negative for dysuria, flank pain, frequency, hematuria and urgency.  Musculoskeletal:  Negative for back pain, joint pain and myalgias.  Skin:  Negative for rash.  Neurological:  Negative for dizziness, tingling, focal weakness, seizures, weakness and headaches.  Endo/Heme/Allergies:  Does not bruise/bleed easily.  Psychiatric/Behavioral:  Negative for depression and suicidal ideas. The patient does not have insomnia.  Allergies  Allergen Reactions   Iodinated Contrast Media Shortness Of Breath and Rash   Flexeril [Cyclobenzaprine]     Intolerant- sedation.     Iodine Other (See Comments)    Unknown   Tramadol Other (See Comments)    Ineffective for pain.    Vioxx [Rofecoxib] Other  (See Comments)    LFT elevation   Metformin And Related Other (See Comments)    Muscle cramps   Valium [Diazepam] Rash     Past Medical History:  Diagnosis Date   Acute venous embolism and thrombosis of unspecified deep vessels of lower extremity    Cancer (HCC)    CHF (congestive heart failure) (HCC)    Cirrhosis of liver (HCC)    due to fatty liver   Compression fx, lumbar spine ([place])    2017   Elevated blood pressure reading without diagnosis of hypertension    Headache    Lumbago    Peripheral vascular disease (HCC)    Polyp of colon    Pulmonary embolism (HCC)    Type II or unspecified type diabetes mellitus without mention of complication, not stated as uncontrolled    Unspecified arthropathy, ankle and foot      Past Surgical History:  Procedure Laterality Date   COLONOSCOPY WITH PROPOFOL N/A 11/10/2019   Procedure: COLONOSCOPY WITH PROPOFOL;  Surgeon: Toledo, Benay Pike, MD;  Location: [facility];  Service: Gastroenterology;  Laterality: N/A;   CYSTOSCOPY W/ RETROGRADES Bilateral 11/26/2021   Procedure: CYSTOSCOPY WITH RETROGRADE PYELOGRAM;  Surgeon: Hollice Espy, MD;  Location: [facility] OR[facility];  Service: Urology;  Laterality: Bilateral;   CYSTOSCOPY WITH BIOPSY N/A 11/26/2021   Procedure: CYSTOSCOPY WITH BLADDER BIOPSY;  Surgeon: Hollice Espy, MD;  Location: [facility] ORS;  Service: Urology;  Laterality: N/A;   FOOT SURGERY Left    HAND SURGERY Right    IR RADIOLOGIST EVAL & MGMT  11/08/2021   IR RADIOLOGIST EVAL & MGMT  02/11/2022   KYPHOPLASTY N/A 07/11/2016   Procedure: LUMBAR 2 KYPHOPLASTY;  Surgeon: Phylliss Bob, MD;  Location: [place];  Service: Orthopedics;  Laterality: N/A;  LUMBAR 2 KYPHOPLASTY   KYPHOPLASTY  2021   LAMINECTOMY  ~1998   RADIOLOGY WITH ANESTHESIA N/A 01/09/2022   Procedure: CT MICROWAVE ABLATION;  Surgeon: Arne Cleveland, MD;  Location: [facility];  Service: Radiology;  Laterality: N/A;    Social History   Socioeconomic History   Marital status:  Married    Spouse name: Not on file   Number of children: Not on file   Years of education: Not on file   Highest education level: Not on file  Occupational History   Not on file  Tobacco Use   Smoking status: Former    Types: Cigarettes   Smokeless tobacco: Never  Vaping Use   Vaping Use: Never used  Substance and Sexual Activity   Alcohol use: Not Currently   Drug use: No   Sexual activity: Yes  Other Topics Concern   Not on file  Social History Narrative   2 kids, local   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[employer], 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   Married 1979   Social Determinants of Radio broadcast assistant Strain: [place]  (02/05/2022)   Overall Financial Resource Strain (CARDIA)    Difficulty of Paying Living Expenses: Not hard at all  Food Insecurity: No Food Insecurity (02/05/2022)   Hunger Vital Sign    Worried About Running Out of Food in the Last Year: Never true  Ran Out of Food in the Last Year: Never true  Transportation Needs: No Transportation Needs (02/05/2022)   PRAPARE - Hydrologist (Medical): No    Lack of Transportation (Non-Medical): No  Physical Activity: Inactive (02/05/2022)   Exercise Vital Sign    Days of Exercise per Week: 0 days    Minutes of Exercise per Session: 0 min  Stress: No Stress Concern Present (02/05/2022)   [place]    Feeling of Stress : Not at all  Social Connections: Moderately Isolated (02/05/2022)   Social Connection and Isolation Panel [NHANES]    Frequency of Communication with Friends and Family: More than three times a week    Frequency of Social Gatherings with Friends and Family: More than three times a week    Attends Religious Services: Never    Marine scientist or Organizations: No    Attends Archivist Meetings: Never    Marital Status: Married  Human resources officer Violence: Not At Risk (02/05/2022)   Humiliation,  Afraid, Rape, and Kick questionnaire    Fear of Current or Ex-Partner: No    Emotionally Abused: No    Physically Abused: No    Sexually Abused: No    Family History  Problem Relation Age of Onset   Breast cancer Mother    Alzheimer's disease Mother    Breast cancer Sister    Stroke Father    Colon cancer Neg Hx    Prostate cancer Neg Hx      Current Outpatient Medications:    Continuous Blood Gluc Receiver ([place]) [place], by Does not apply route., Disp: , Rfl:    Continuous Blood Gluc Sensor (DEXCOM G7 SENSOR) MISC, Apply sensor every 10 days, Disp: , Rfl:    Dulaglutide (TRULICITY) 9.14 NW/2.9FA SOPN, Inject 0.75 mg into the skin once a week., Disp: 6 mL, Rfl: 1   fluticasone (FLONASE) 50 MCG/ACT nasal spray, Place 2 sprays into both nostrils daily., Disp: 16 g, Rfl: 0   furosemide (LASIX) 40 MG tablet, Take 1 tablet (40 mg total) by mouth daily. Take extra 40 mg for weight gain of 3 lbs in 1 day or 5 lbs 1 week, Disp: 90 tablet, Rfl: 0   Krill Oil 1000 MG CAPS, Take 2,000 mg by mouth daily., Disp: , Rfl:    lactulose (CHRONULAC) 10 GM/15ML solution, TAKE 15 MLS (10 GRAMS) BY MOUTH 2 TIMES DAILY AS NEEDED, Disp: 900 mL, Rfl: 0   oxyCODONE-acetaminophen (PERCOCET) 10-325 MG tablet, Take 1 tablet by mouth every 6 (six) hours as needed for pain (for back pain)., Disp: 20 tablet, Rfl: 0   pantoprazole (PROTONIX) 40 MG tablet, TAKE 1 TABLET BY MOUTH ONCE A DAY, Disp: 30 tablet, Rfl: 3   saccharomyces boulardii (FLORASTOR) 250 MG capsule, Take 1 capsule (250 mg total) by mouth daily., Disp: , Rfl:    spironolactone (ALDACTONE) 100 MG tablet, Take 100 mg by mouth daily., Disp: , Rfl:    XARELTO 20 MG TABS tablet, TAKE 1 TABLET BY MOUTH ONCE DAILY WITH SUPPER, Disp: 60 tablet, Rfl: 1  Physical exam:  Vitals:   02/25/22 1142  BP: (!) 103/49  Pulse: (!) 58  Resp: 16  Temp: 98 F (36.7 C)  TempSrc: Oral  Weight: 258 lb (117 kg)  Height: 6' (1.829 m)   Physical  Exam Constitutional:      General: He is not in acute distress. Cardiovascular:  Rate and Rhythm: Normal rate and regular rhythm.     Heart sounds: Normal heart sounds.  Pulmonary:     Effort: Pulmonary effort is normal.     Breath sounds: Normal breath sounds.  Abdominal:     General: Bowel sounds are normal.     Palpations: Abdomen is soft.  Musculoskeletal:     Right lower leg: No edema.     Left lower leg: No edema.  Skin:    General: Skin is warm and dry.  Neurological:     Mental Status: He is alert and oriented to person, place, and time.         Latest Ref Rng & Units 01/18/2022   12:18 PM  CMP  Glucose 70 - 99 mg/dL 195   BUN 6 - 23 mg/dL 17   Creatinine 0.40 - 1.50 mg/dL 1.08   Sodium 135 - 145 mEq/L 136   Potassium 3.5 - 5.1 mEq/L 4.5   Chloride 96 - 112 mEq/L 104   CO2 19 - 32 mEq/L 26   Calcium 8.4 - 10.5 mg/dL 8.3       Latest Ref Rng & Units 01/09/2022    8:00 AM  CBC  WBC 4.0 - 10.5 K/uL 2.1   Hemoglobin 13.0 - 17.0 g/dL 9.9   Hematocrit 39.0 - 52.0 % 31.4   Platelets 150 - 400 K/uL 130     No images are attached to the encounter.  IR Radiologist Eval & Mgmt  Result Date: 02/11/2022 Please refer to notes tab for details about interventional procedure. (Op Note)    Assessment and plan- Patient is a 66 y.o. male with history of stage II hepatocellular carcinoma here for routine follow-up  Patient's initial MRI in June 2023 that was done at [facility] showed a segment 6 LI-RADS 5 lesion for which she underwent microwave ablation.  He was also found to have a segment 2 lesion which was LI-RADS 4.  It could not be visualized at the time of microwave ablation.  Plan was to proceed with Y90 for that lesion as per IR and they are currently awaiting insurance approval for the same.  In the meanwhile patient is still being worked up for potential transplant down the line at [facility].  He underwent a MRI abdomen in September 2023 which showed 2 new lesions in  segment 8 of the liver which were characterized as BI-RADS 4 LI-RADS 3 respectively.  These lesions were not seen on the MRI in June 2023.  Dr. Vernard Gambles recently saw the patient on 02/11/2022 to discuss Y90 treatment for segment 2 lesion but was not aware of the segment 8 lesions.  I did reach out to him today and he will be awaiting [facility] images to be sent over to decide how to manage segment 8 lesions while patient is waiting to undergo a transplant.   I will tentatively see the patient back in February with CBC with differential CMP and AFP.  Since patient is already scheduled to get another MRI at [facility] in January 2024 I am holding off on ordering any further imaging at this time.   Visit Diagnosis 1. Hepatocellular carcinoma ([place])      Dr. Randa Evens, MD, MPH [facility] [phone] 02/25/2022 12:53 PM

## 2022-02-25 NOTE — Progress Notes (Signed)
Patient is fatigued , wants to drink more but is limited to sodium in take and making sure not to over due it because of CHF.

## 2022-02-26 ENCOUNTER — Telehealth: Payer: Self-pay | Admitting: *Deleted

## 2022-02-26 NOTE — Telephone Encounter (Signed)
Our team got a message that scheduler called to set up for scan and the wife said that he is being worked up for liver transplant and they go to [facility] and wants to keep the radiology scans at [facility]. Per Dr. Janese Banks we will cancel the scan. Wife was told that we would not schedule the scan

## 2022-02-28 DIAGNOSIS — C220 Liver cell carcinoma: Secondary | ICD-10-CM | POA: Diagnosis not present

## 2022-02-28 DIAGNOSIS — Z01818 Encounter for other preprocedural examination: Secondary | ICD-10-CM | POA: Diagnosis not present

## 2022-03-11 DIAGNOSIS — Z7901 Long term (current) use of anticoagulants: Secondary | ICD-10-CM | POA: Diagnosis not present

## 2022-03-11 DIAGNOSIS — I110 Hypertensive heart disease with heart failure: Secondary | ICD-10-CM | POA: Diagnosis not present

## 2022-03-11 DIAGNOSIS — E119 Type 2 diabetes mellitus without complications: Secondary | ICD-10-CM | POA: Diagnosis not present

## 2022-03-11 DIAGNOSIS — E871 Hypo-osmolality and hyponatremia: Secondary | ICD-10-CM | POA: Diagnosis not present

## 2022-03-11 DIAGNOSIS — Z7952 Long term (current) use of systemic steroids: Secondary | ICD-10-CM | POA: Diagnosis not present

## 2022-03-11 DIAGNOSIS — K769 Liver disease, unspecified: Secondary | ICD-10-CM | POA: Diagnosis not present

## 2022-03-11 DIAGNOSIS — Z87891 Personal history of nicotine dependence: Secondary | ICD-10-CM | POA: Diagnosis not present

## 2022-03-11 DIAGNOSIS — Z86711 Personal history of pulmonary embolism: Secondary | ICD-10-CM | POA: Diagnosis not present

## 2022-03-11 DIAGNOSIS — I5032 Chronic diastolic (congestive) heart failure: Secondary | ICD-10-CM | POA: Diagnosis not present

## 2022-03-11 DIAGNOSIS — Z79899 Other long term (current) drug therapy: Secondary | ICD-10-CM | POA: Diagnosis not present

## 2022-03-11 DIAGNOSIS — Z86718 Personal history of other venous thrombosis and embolism: Secondary | ICD-10-CM | POA: Diagnosis not present

## 2022-03-11 DIAGNOSIS — Z79891 Long term (current) use of opiate analgesic: Secondary | ICD-10-CM | POA: Diagnosis not present

## 2022-03-11 DIAGNOSIS — K766 Portal hypertension: Secondary | ICD-10-CM | POA: Diagnosis not present

## 2022-03-11 DIAGNOSIS — C228 Malignant neoplasm of liver, primary, unspecified as to type: Secondary | ICD-10-CM | POA: Diagnosis not present

## 2022-03-11 DIAGNOSIS — R001 Bradycardia, unspecified: Secondary | ICD-10-CM | POA: Diagnosis not present

## 2022-03-11 DIAGNOSIS — K7460 Unspecified cirrhosis of liver: Secondary | ICD-10-CM | POA: Diagnosis not present

## 2022-03-11 DIAGNOSIS — R079 Chest pain, unspecified: Secondary | ICD-10-CM | POA: Diagnosis not present

## 2022-03-11 DIAGNOSIS — Z1152 Encounter for screening for COVID-19: Secondary | ICD-10-CM | POA: Diagnosis not present

## 2022-03-11 DIAGNOSIS — D649 Anemia, unspecified: Secondary | ICD-10-CM | POA: Diagnosis not present

## 2022-03-11 DIAGNOSIS — C220 Liver cell carcinoma: Secondary | ICD-10-CM | POA: Diagnosis not present

## 2022-03-11 DIAGNOSIS — R188 Other ascites: Secondary | ICD-10-CM | POA: Diagnosis not present

## 2022-03-11 DIAGNOSIS — K922 Gastrointestinal hemorrhage, unspecified: Secondary | ICD-10-CM | POA: Diagnosis not present

## 2022-03-11 DIAGNOSIS — E8809 Other disorders of plasma-protein metabolism, not elsewhere classified: Secondary | ICD-10-CM | POA: Diagnosis not present

## 2022-03-11 DIAGNOSIS — K760 Fatty (change of) liver, not elsewhere classified: Secondary | ICD-10-CM | POA: Diagnosis not present

## 2022-03-12 DIAGNOSIS — K317 Polyp of stomach and duodenum: Secondary | ICD-10-CM | POA: Diagnosis not present

## 2022-03-12 DIAGNOSIS — D649 Anemia, unspecified: Secondary | ICD-10-CM | POA: Insufficient documentation

## 2022-03-12 DIAGNOSIS — E119 Type 2 diabetes mellitus without complications: Secondary | ICD-10-CM | POA: Diagnosis not present

## 2022-03-12 DIAGNOSIS — K3189 Other diseases of stomach and duodenum: Secondary | ICD-10-CM | POA: Diagnosis not present

## 2022-03-12 DIAGNOSIS — C220 Liver cell carcinoma: Secondary | ICD-10-CM | POA: Diagnosis not present

## 2022-03-12 DIAGNOSIS — K2960 Other gastritis without bleeding: Secondary | ICD-10-CM | POA: Diagnosis not present

## 2022-03-12 DIAGNOSIS — K31819 Angiodysplasia of stomach and duodenum without bleeding: Secondary | ICD-10-CM | POA: Diagnosis not present

## 2022-03-12 DIAGNOSIS — I8510 Secondary esophageal varices without bleeding: Secondary | ICD-10-CM | POA: Diagnosis not present

## 2022-03-12 DIAGNOSIS — K2950 Unspecified chronic gastritis without bleeding: Secondary | ICD-10-CM | POA: Diagnosis not present

## 2022-03-12 DIAGNOSIS — R188 Other ascites: Secondary | ICD-10-CM | POA: Diagnosis not present

## 2022-03-12 DIAGNOSIS — K7460 Unspecified cirrhosis of liver: Secondary | ICD-10-CM | POA: Diagnosis not present

## 2022-03-13 DIAGNOSIS — I739 Peripheral vascular disease, unspecified: Secondary | ICD-10-CM | POA: Diagnosis not present

## 2022-03-13 DIAGNOSIS — R7989 Other specified abnormal findings of blood chemistry: Secondary | ICD-10-CM | POA: Diagnosis not present

## 2022-03-13 DIAGNOSIS — G8918 Other acute postprocedural pain: Secondary | ICD-10-CM | POA: Diagnosis not present

## 2022-03-13 DIAGNOSIS — C220 Liver cell carcinoma: Secondary | ICD-10-CM | POA: Diagnosis not present

## 2022-03-13 DIAGNOSIS — E1169 Type 2 diabetes mellitus with other specified complication: Secondary | ICD-10-CM | POA: Diagnosis not present

## 2022-03-13 DIAGNOSIS — M5459 Other low back pain: Secondary | ICD-10-CM | POA: Diagnosis not present

## 2022-03-13 DIAGNOSIS — Z1152 Encounter for screening for COVID-19: Secondary | ICD-10-CM | POA: Diagnosis not present

## 2022-03-13 DIAGNOSIS — K91870 Postprocedural hematoma of a digestive system organ or structure following a digestive system procedure: Secondary | ICD-10-CM | POA: Diagnosis not present

## 2022-03-13 DIAGNOSIS — D849 Immunodeficiency, unspecified: Secondary | ICD-10-CM | POA: Diagnosis not present

## 2022-03-13 DIAGNOSIS — S301XXD Contusion of abdominal wall, subsequent encounter: Secondary | ICD-10-CM | POA: Diagnosis not present

## 2022-03-13 DIAGNOSIS — R739 Hyperglycemia, unspecified: Secondary | ICD-10-CM | POA: Diagnosis not present

## 2022-03-13 DIAGNOSIS — Z86718 Personal history of other venous thrombosis and embolism: Secondary | ICD-10-CM | POA: Diagnosis not present

## 2022-03-13 DIAGNOSIS — K7210 Chronic hepatic failure without coma: Secondary | ICD-10-CM | POA: Diagnosis not present

## 2022-03-13 DIAGNOSIS — K6389 Other specified diseases of intestine: Secondary | ICD-10-CM | POA: Diagnosis not present

## 2022-03-13 DIAGNOSIS — K7290 Hepatic failure, unspecified without coma: Secondary | ICD-10-CM | POA: Diagnosis not present

## 2022-03-13 DIAGNOSIS — Z86711 Personal history of pulmonary embolism: Secondary | ICD-10-CM | POA: Diagnosis not present

## 2022-03-13 DIAGNOSIS — J90 Pleural effusion, not elsewhere classified: Secondary | ICD-10-CM | POA: Diagnosis not present

## 2022-03-13 DIAGNOSIS — Z792 Long term (current) use of antibiotics: Secondary | ICD-10-CM | POA: Diagnosis not present

## 2022-03-13 DIAGNOSIS — K766 Portal hypertension: Secondary | ICD-10-CM | POA: Diagnosis not present

## 2022-03-13 DIAGNOSIS — Z7901 Long term (current) use of anticoagulants: Secondary | ICD-10-CM | POA: Diagnosis not present

## 2022-03-13 DIAGNOSIS — D62 Acute posthemorrhagic anemia: Secondary | ICD-10-CM | POA: Diagnosis not present

## 2022-03-13 DIAGNOSIS — D631 Anemia in chronic kidney disease: Secondary | ICD-10-CM | POA: Diagnosis not present

## 2022-03-13 DIAGNOSIS — E1165 Type 2 diabetes mellitus with hyperglycemia: Secondary | ICD-10-CM | POA: Diagnosis not present

## 2022-03-13 DIAGNOSIS — K567 Ileus, unspecified: Secondary | ICD-10-CM | POA: Diagnosis not present

## 2022-03-13 DIAGNOSIS — S301XXA Contusion of abdominal wall, initial encounter: Secondary | ICD-10-CM | POA: Diagnosis not present

## 2022-03-13 DIAGNOSIS — T8643 Liver transplant infection: Secondary | ICD-10-CM | POA: Diagnosis not present

## 2022-03-13 DIAGNOSIS — R188 Other ascites: Secondary | ICD-10-CM | POA: Diagnosis not present

## 2022-03-13 DIAGNOSIS — K760 Fatty (change of) liver, not elsewhere classified: Secondary | ICD-10-CM | POA: Diagnosis not present

## 2022-03-13 DIAGNOSIS — N183 Chronic kidney disease, stage 3 unspecified: Secondary | ICD-10-CM | POA: Diagnosis not present

## 2022-03-13 DIAGNOSIS — K7400 Hepatic fibrosis, unspecified: Secondary | ICD-10-CM | POA: Diagnosis not present

## 2022-03-13 DIAGNOSIS — I5030 Unspecified diastolic (congestive) heart failure: Secondary | ICD-10-CM | POA: Diagnosis not present

## 2022-03-13 DIAGNOSIS — G8929 Other chronic pain: Secondary | ICD-10-CM | POA: Diagnosis not present

## 2022-03-13 DIAGNOSIS — T8649 Other complications of liver transplant: Secondary | ICD-10-CM | POA: Diagnosis not present

## 2022-03-13 DIAGNOSIS — K7581 Nonalcoholic steatohepatitis (NASH): Secondary | ICD-10-CM | POA: Diagnosis not present

## 2022-03-13 DIAGNOSIS — E1122 Type 2 diabetes mellitus with diabetic chronic kidney disease: Secondary | ICD-10-CM | POA: Diagnosis not present

## 2022-03-13 DIAGNOSIS — G4734 Idiopathic sleep related nonobstructive alveolar hypoventilation: Secondary | ICD-10-CM | POA: Diagnosis not present

## 2022-03-13 DIAGNOSIS — Z944 Liver transplant status: Secondary | ICD-10-CM | POA: Diagnosis not present

## 2022-03-13 DIAGNOSIS — K91871 Postprocedural hematoma of a digestive system organ or structure following other procedure: Secondary | ICD-10-CM | POA: Diagnosis not present

## 2022-03-13 DIAGNOSIS — I5032 Chronic diastolic (congestive) heart failure: Secondary | ICD-10-CM | POA: Diagnosis not present

## 2022-03-13 DIAGNOSIS — I82409 Acute embolism and thrombosis of unspecified deep veins of unspecified lower extremity: Secondary | ICD-10-CM | POA: Diagnosis not present

## 2022-03-13 DIAGNOSIS — I130 Hypertensive heart and chronic kidney disease with heart failure and stage 1 through stage 4 chronic kidney disease, or unspecified chronic kidney disease: Secondary | ICD-10-CM | POA: Diagnosis not present

## 2022-03-13 DIAGNOSIS — J811 Chronic pulmonary edema: Secondary | ICD-10-CM | POA: Diagnosis not present

## 2022-03-13 DIAGNOSIS — K739 Chronic hepatitis, unspecified: Secondary | ICD-10-CM | POA: Diagnosis not present

## 2022-03-13 DIAGNOSIS — T148XXA Other injury of unspecified body region, initial encounter: Secondary | ICD-10-CM | POA: Diagnosis not present

## 2022-03-13 DIAGNOSIS — E872 Acidosis, unspecified: Secondary | ICD-10-CM | POA: Diagnosis not present

## 2022-03-13 DIAGNOSIS — E871 Hypo-osmolality and hyponatremia: Secondary | ICD-10-CM | POA: Diagnosis not present

## 2022-03-13 DIAGNOSIS — I2699 Other pulmonary embolism without acute cor pulmonale: Secondary | ICD-10-CM | POA: Diagnosis not present

## 2022-03-13 DIAGNOSIS — I959 Hypotension, unspecified: Secondary | ICD-10-CM | POA: Diagnosis not present

## 2022-03-13 DIAGNOSIS — D649 Anemia, unspecified: Secondary | ICD-10-CM | POA: Diagnosis not present

## 2022-03-13 DIAGNOSIS — J9811 Atelectasis: Secondary | ICD-10-CM | POA: Diagnosis not present

## 2022-03-13 DIAGNOSIS — T380X5A Adverse effect of glucocorticoids and synthetic analogues, initial encounter: Secondary | ICD-10-CM | POA: Diagnosis not present

## 2022-03-13 DIAGNOSIS — K828 Other specified diseases of gallbladder: Secondary | ICD-10-CM | POA: Diagnosis not present

## 2022-03-13 DIAGNOSIS — N179 Acute kidney failure, unspecified: Secondary | ICD-10-CM | POA: Diagnosis not present

## 2022-03-13 DIAGNOSIS — K7460 Unspecified cirrhosis of liver: Secondary | ICD-10-CM | POA: Diagnosis not present

## 2022-03-13 DIAGNOSIS — K9189 Other postprocedural complications and disorders of digestive system: Secondary | ICD-10-CM | POA: Diagnosis not present

## 2022-03-13 DIAGNOSIS — Z7682 Awaiting organ transplant status: Secondary | ICD-10-CM | POA: Diagnosis not present

## 2022-03-13 DIAGNOSIS — Z5181 Encounter for therapeutic drug level monitoring: Secondary | ICD-10-CM | POA: Diagnosis not present

## 2022-03-13 DIAGNOSIS — Z452 Encounter for adjustment and management of vascular access device: Secondary | ICD-10-CM | POA: Diagnosis not present

## 2022-03-13 DIAGNOSIS — I8510 Secondary esophageal varices without bleeding: Secondary | ICD-10-CM | POA: Diagnosis not present

## 2022-03-13 DIAGNOSIS — D84821 Immunodeficiency due to drugs: Secondary | ICD-10-CM | POA: Diagnosis not present

## 2022-03-13 DIAGNOSIS — K5989 Other specified functional intestinal disorders: Secondary | ICD-10-CM | POA: Diagnosis not present

## 2022-03-13 DIAGNOSIS — D500 Iron deficiency anemia secondary to blood loss (chronic): Secondary | ICD-10-CM | POA: Diagnosis not present

## 2022-03-13 DIAGNOSIS — Z794 Long term (current) use of insulin: Secondary | ICD-10-CM | POA: Diagnosis not present

## 2022-03-13 DIAGNOSIS — E119 Type 2 diabetes mellitus without complications: Secondary | ICD-10-CM | POA: Diagnosis not present

## 2022-03-13 DIAGNOSIS — L7632 Postprocedural hematoma of skin and subcutaneous tissue following other procedure: Secondary | ICD-10-CM | POA: Diagnosis not present

## 2022-03-13 DIAGNOSIS — E46 Unspecified protein-calorie malnutrition: Secondary | ICD-10-CM | POA: Diagnosis not present

## 2022-03-13 DIAGNOSIS — J9600 Acute respiratory failure, unspecified whether with hypoxia or hypercapnia: Secondary | ICD-10-CM | POA: Diagnosis not present

## 2022-03-13 DIAGNOSIS — M549 Dorsalgia, unspecified: Secondary | ICD-10-CM | POA: Diagnosis not present

## 2022-03-13 DIAGNOSIS — D638 Anemia in other chronic diseases classified elsewhere: Secondary | ICD-10-CM | POA: Diagnosis not present

## 2022-03-13 DIAGNOSIS — K7589 Other specified inflammatory liver diseases: Secondary | ICD-10-CM | POA: Diagnosis not present

## 2022-03-14 DIAGNOSIS — E1165 Type 2 diabetes mellitus with hyperglycemia: Secondary | ICD-10-CM | POA: Diagnosis not present

## 2022-03-15 ENCOUNTER — Other Ambulatory Visit: Payer: Self-pay | Admitting: Family Medicine

## 2022-03-16 DIAGNOSIS — Z7682 Awaiting organ transplant status: Secondary | ICD-10-CM | POA: Diagnosis not present

## 2022-03-17 DIAGNOSIS — K739 Chronic hepatitis, unspecified: Secondary | ICD-10-CM | POA: Diagnosis not present

## 2022-03-17 DIAGNOSIS — I739 Peripheral vascular disease, unspecified: Secondary | ICD-10-CM | POA: Diagnosis not present

## 2022-03-17 DIAGNOSIS — J9600 Acute respiratory failure, unspecified whether with hypoxia or hypercapnia: Secondary | ICD-10-CM | POA: Diagnosis not present

## 2022-03-17 DIAGNOSIS — K828 Other specified diseases of gallbladder: Secondary | ICD-10-CM | POA: Diagnosis not present

## 2022-03-17 DIAGNOSIS — K7581 Nonalcoholic steatohepatitis (NASH): Secondary | ICD-10-CM | POA: Diagnosis not present

## 2022-03-17 DIAGNOSIS — K7290 Hepatic failure, unspecified without coma: Secondary | ICD-10-CM | POA: Diagnosis not present

## 2022-03-17 DIAGNOSIS — K7460 Unspecified cirrhosis of liver: Secondary | ICD-10-CM | POA: Diagnosis not present

## 2022-03-17 DIAGNOSIS — J90 Pleural effusion, not elsewhere classified: Secondary | ICD-10-CM | POA: Diagnosis not present

## 2022-03-17 DIAGNOSIS — I82409 Acute embolism and thrombosis of unspecified deep veins of unspecified lower extremity: Secondary | ICD-10-CM | POA: Diagnosis not present

## 2022-03-17 DIAGNOSIS — I5030 Unspecified diastolic (congestive) heart failure: Secondary | ICD-10-CM | POA: Diagnosis not present

## 2022-03-17 DIAGNOSIS — J811 Chronic pulmonary edema: Secondary | ICD-10-CM | POA: Diagnosis not present

## 2022-03-17 DIAGNOSIS — M549 Dorsalgia, unspecified: Secondary | ICD-10-CM | POA: Diagnosis not present

## 2022-03-17 DIAGNOSIS — G8918 Other acute postprocedural pain: Secondary | ICD-10-CM | POA: Diagnosis not present

## 2022-03-17 DIAGNOSIS — J9811 Atelectasis: Secondary | ICD-10-CM | POA: Diagnosis not present

## 2022-03-17 DIAGNOSIS — I2699 Other pulmonary embolism without acute cor pulmonale: Secondary | ICD-10-CM | POA: Diagnosis not present

## 2022-03-17 DIAGNOSIS — I959 Hypotension, unspecified: Secondary | ICD-10-CM | POA: Diagnosis not present

## 2022-03-17 DIAGNOSIS — C220 Liver cell carcinoma: Secondary | ICD-10-CM | POA: Diagnosis not present

## 2022-03-17 DIAGNOSIS — G8929 Other chronic pain: Secondary | ICD-10-CM | POA: Diagnosis not present

## 2022-03-17 DIAGNOSIS — Z7682 Awaiting organ transplant status: Secondary | ICD-10-CM | POA: Diagnosis not present

## 2022-03-17 DIAGNOSIS — D849 Immunodeficiency, unspecified: Secondary | ICD-10-CM | POA: Diagnosis not present

## 2022-03-17 DIAGNOSIS — Z452 Encounter for adjustment and management of vascular access device: Secondary | ICD-10-CM | POA: Diagnosis not present

## 2022-03-18 DIAGNOSIS — R7989 Other specified abnormal findings of blood chemistry: Secondary | ICD-10-CM | POA: Diagnosis not present

## 2022-03-18 DIAGNOSIS — D62 Acute posthemorrhagic anemia: Secondary | ICD-10-CM | POA: Diagnosis not present

## 2022-03-18 DIAGNOSIS — T380X5A Adverse effect of glucocorticoids and synthetic analogues, initial encounter: Secondary | ICD-10-CM | POA: Diagnosis not present

## 2022-03-18 DIAGNOSIS — Z792 Long term (current) use of antibiotics: Secondary | ICD-10-CM | POA: Diagnosis not present

## 2022-03-18 DIAGNOSIS — G8929 Other chronic pain: Secondary | ICD-10-CM | POA: Diagnosis not present

## 2022-03-18 DIAGNOSIS — Z944 Liver transplant status: Secondary | ICD-10-CM | POA: Diagnosis not present

## 2022-03-18 DIAGNOSIS — D849 Immunodeficiency, unspecified: Secondary | ICD-10-CM | POA: Diagnosis not present

## 2022-03-18 DIAGNOSIS — E119 Type 2 diabetes mellitus without complications: Secondary | ICD-10-CM | POA: Diagnosis not present

## 2022-03-18 DIAGNOSIS — Z5181 Encounter for therapeutic drug level monitoring: Secondary | ICD-10-CM | POA: Diagnosis not present

## 2022-03-18 DIAGNOSIS — G8918 Other acute postprocedural pain: Secondary | ICD-10-CM | POA: Diagnosis not present

## 2022-03-18 DIAGNOSIS — Z86718 Personal history of other venous thrombosis and embolism: Secondary | ICD-10-CM | POA: Diagnosis not present

## 2022-03-18 DIAGNOSIS — E1165 Type 2 diabetes mellitus with hyperglycemia: Secondary | ICD-10-CM | POA: Diagnosis not present

## 2022-03-18 DIAGNOSIS — Z86711 Personal history of pulmonary embolism: Secondary | ICD-10-CM | POA: Diagnosis not present

## 2022-03-18 DIAGNOSIS — M5459 Other low back pain: Secondary | ICD-10-CM | POA: Diagnosis not present

## 2022-03-18 DIAGNOSIS — Z794 Long term (current) use of insulin: Secondary | ICD-10-CM | POA: Diagnosis not present

## 2022-03-18 DIAGNOSIS — N179 Acute kidney failure, unspecified: Secondary | ICD-10-CM | POA: Diagnosis not present

## 2022-03-18 DIAGNOSIS — K7460 Unspecified cirrhosis of liver: Secondary | ICD-10-CM | POA: Diagnosis not present

## 2022-03-18 DIAGNOSIS — I2699 Other pulmonary embolism without acute cor pulmonale: Secondary | ICD-10-CM | POA: Diagnosis not present

## 2022-03-18 DIAGNOSIS — Z7901 Long term (current) use of anticoagulants: Secondary | ICD-10-CM | POA: Diagnosis not present

## 2022-03-18 DIAGNOSIS — R739 Hyperglycemia, unspecified: Secondary | ICD-10-CM | POA: Diagnosis not present

## 2022-03-18 DIAGNOSIS — K7581 Nonalcoholic steatohepatitis (NASH): Secondary | ICD-10-CM | POA: Diagnosis not present

## 2022-03-19 ENCOUNTER — Telehealth: Payer: Self-pay | Admitting: Family Medicine

## 2022-03-19 DIAGNOSIS — D849 Immunodeficiency, unspecified: Secondary | ICD-10-CM | POA: Diagnosis not present

## 2022-03-19 DIAGNOSIS — Z86718 Personal history of other venous thrombosis and embolism: Secondary | ICD-10-CM | POA: Diagnosis not present

## 2022-03-19 DIAGNOSIS — G8918 Other acute postprocedural pain: Secondary | ICD-10-CM | POA: Diagnosis not present

## 2022-03-19 DIAGNOSIS — R7989 Other specified abnormal findings of blood chemistry: Secondary | ICD-10-CM | POA: Diagnosis not present

## 2022-03-19 DIAGNOSIS — N179 Acute kidney failure, unspecified: Secondary | ICD-10-CM | POA: Diagnosis not present

## 2022-03-19 DIAGNOSIS — K7581 Nonalcoholic steatohepatitis (NASH): Secondary | ICD-10-CM | POA: Diagnosis not present

## 2022-03-19 DIAGNOSIS — Z7682 Awaiting organ transplant status: Secondary | ICD-10-CM | POA: Diagnosis not present

## 2022-03-19 DIAGNOSIS — E1165 Type 2 diabetes mellitus with hyperglycemia: Secondary | ICD-10-CM | POA: Diagnosis not present

## 2022-03-19 DIAGNOSIS — D62 Acute posthemorrhagic anemia: Secondary | ICD-10-CM | POA: Diagnosis not present

## 2022-03-19 DIAGNOSIS — K567 Ileus, unspecified: Secondary | ICD-10-CM | POA: Diagnosis not present

## 2022-03-19 DIAGNOSIS — R739 Hyperglycemia, unspecified: Secondary | ICD-10-CM | POA: Diagnosis not present

## 2022-03-19 DIAGNOSIS — Z944 Liver transplant status: Secondary | ICD-10-CM | POA: Diagnosis not present

## 2022-03-19 DIAGNOSIS — T380X5A Adverse effect of glucocorticoids and synthetic analogues, initial encounter: Secondary | ICD-10-CM | POA: Diagnosis not present

## 2022-03-19 DIAGNOSIS — Z5181 Encounter for therapeutic drug level monitoring: Secondary | ICD-10-CM | POA: Diagnosis not present

## 2022-03-19 NOTE — Telephone Encounter (Signed)
Pt wife called to make PCP aware that pt is in the hospital

## 2022-03-19 NOTE — Telephone Encounter (Signed)
Duly noted.  I see that now.  I will await the notes from the in patient team and I'll be thinking about both of them.  Thanks.

## 2022-03-20 DIAGNOSIS — Z944 Liver transplant status: Secondary | ICD-10-CM | POA: Diagnosis not present

## 2022-03-20 DIAGNOSIS — Z5181 Encounter for therapeutic drug level monitoring: Secondary | ICD-10-CM | POA: Diagnosis not present

## 2022-03-20 DIAGNOSIS — K5989 Other specified functional intestinal disorders: Secondary | ICD-10-CM | POA: Diagnosis not present

## 2022-03-20 DIAGNOSIS — Z7682 Awaiting organ transplant status: Secondary | ICD-10-CM | POA: Diagnosis not present

## 2022-03-20 DIAGNOSIS — E1165 Type 2 diabetes mellitus with hyperglycemia: Secondary | ICD-10-CM | POA: Diagnosis not present

## 2022-03-20 DIAGNOSIS — R7989 Other specified abnormal findings of blood chemistry: Secondary | ICD-10-CM | POA: Diagnosis not present

## 2022-03-20 DIAGNOSIS — T380X5A Adverse effect of glucocorticoids and synthetic analogues, initial encounter: Secondary | ICD-10-CM | POA: Diagnosis not present

## 2022-03-20 DIAGNOSIS — N179 Acute kidney failure, unspecified: Secondary | ICD-10-CM | POA: Diagnosis not present

## 2022-03-20 DIAGNOSIS — R739 Hyperglycemia, unspecified: Secondary | ICD-10-CM | POA: Diagnosis not present

## 2022-03-20 DIAGNOSIS — D849 Immunodeficiency, unspecified: Secondary | ICD-10-CM | POA: Diagnosis not present

## 2022-03-21 ENCOUNTER — Inpatient Hospital Stay: Payer: Medicare Other | Admitting: Family Medicine

## 2022-03-21 DIAGNOSIS — R7989 Other specified abnormal findings of blood chemistry: Secondary | ICD-10-CM | POA: Diagnosis not present

## 2022-03-21 DIAGNOSIS — Z5181 Encounter for therapeutic drug level monitoring: Secondary | ICD-10-CM | POA: Diagnosis not present

## 2022-03-21 DIAGNOSIS — Z944 Liver transplant status: Secondary | ICD-10-CM | POA: Diagnosis not present

## 2022-03-21 DIAGNOSIS — D849 Immunodeficiency, unspecified: Secondary | ICD-10-CM | POA: Diagnosis not present

## 2022-03-21 DIAGNOSIS — K5989 Other specified functional intestinal disorders: Secondary | ICD-10-CM | POA: Diagnosis not present

## 2022-03-21 DIAGNOSIS — N179 Acute kidney failure, unspecified: Secondary | ICD-10-CM | POA: Diagnosis not present

## 2022-03-21 DIAGNOSIS — K6389 Other specified diseases of intestine: Secondary | ICD-10-CM | POA: Diagnosis not present

## 2022-03-21 DIAGNOSIS — Z7682 Awaiting organ transplant status: Secondary | ICD-10-CM | POA: Diagnosis not present

## 2022-03-22 DIAGNOSIS — N179 Acute kidney failure, unspecified: Secondary | ICD-10-CM | POA: Diagnosis not present

## 2022-03-22 DIAGNOSIS — Z7901 Long term (current) use of anticoagulants: Secondary | ICD-10-CM | POA: Diagnosis not present

## 2022-03-22 DIAGNOSIS — K7581 Nonalcoholic steatohepatitis (NASH): Secondary | ICD-10-CM | POA: Diagnosis not present

## 2022-03-22 DIAGNOSIS — Z86718 Personal history of other venous thrombosis and embolism: Secondary | ICD-10-CM | POA: Diagnosis not present

## 2022-03-22 DIAGNOSIS — Z7682 Awaiting organ transplant status: Secondary | ICD-10-CM | POA: Diagnosis not present

## 2022-03-22 DIAGNOSIS — G8918 Other acute postprocedural pain: Secondary | ICD-10-CM | POA: Diagnosis not present

## 2022-03-22 DIAGNOSIS — T8643 Liver transplant infection: Secondary | ICD-10-CM | POA: Diagnosis not present

## 2022-03-22 DIAGNOSIS — Z5181 Encounter for therapeutic drug level monitoring: Secondary | ICD-10-CM | POA: Diagnosis not present

## 2022-03-22 DIAGNOSIS — Z792 Long term (current) use of antibiotics: Secondary | ICD-10-CM | POA: Diagnosis not present

## 2022-03-22 DIAGNOSIS — R7989 Other specified abnormal findings of blood chemistry: Secondary | ICD-10-CM | POA: Diagnosis not present

## 2022-03-22 DIAGNOSIS — Z86711 Personal history of pulmonary embolism: Secondary | ICD-10-CM | POA: Diagnosis not present

## 2022-03-22 DIAGNOSIS — T380X5A Adverse effect of glucocorticoids and synthetic analogues, initial encounter: Secondary | ICD-10-CM | POA: Diagnosis not present

## 2022-03-22 DIAGNOSIS — E1165 Type 2 diabetes mellitus with hyperglycemia: Secondary | ICD-10-CM | POA: Diagnosis not present

## 2022-03-22 DIAGNOSIS — E46 Unspecified protein-calorie malnutrition: Secondary | ICD-10-CM | POA: Diagnosis not present

## 2022-03-22 DIAGNOSIS — Z944 Liver transplant status: Secondary | ICD-10-CM | POA: Diagnosis not present

## 2022-03-22 DIAGNOSIS — D849 Immunodeficiency, unspecified: Secondary | ICD-10-CM | POA: Diagnosis not present

## 2022-03-22 DIAGNOSIS — K9189 Other postprocedural complications and disorders of digestive system: Secondary | ICD-10-CM | POA: Diagnosis not present

## 2022-03-22 DIAGNOSIS — R739 Hyperglycemia, unspecified: Secondary | ICD-10-CM | POA: Diagnosis not present

## 2022-03-22 DIAGNOSIS — K567 Ileus, unspecified: Secondary | ICD-10-CM | POA: Diagnosis not present

## 2022-03-22 DIAGNOSIS — K6389 Other specified diseases of intestine: Secondary | ICD-10-CM | POA: Diagnosis not present

## 2022-03-23 DIAGNOSIS — D849 Immunodeficiency, unspecified: Secondary | ICD-10-CM | POA: Diagnosis not present

## 2022-03-23 DIAGNOSIS — R7989 Other specified abnormal findings of blood chemistry: Secondary | ICD-10-CM | POA: Diagnosis not present

## 2022-03-23 DIAGNOSIS — Z944 Liver transplant status: Secondary | ICD-10-CM | POA: Diagnosis not present

## 2022-03-23 DIAGNOSIS — Z5181 Encounter for therapeutic drug level monitoring: Secondary | ICD-10-CM | POA: Diagnosis not present

## 2022-03-24 DIAGNOSIS — Z5181 Encounter for therapeutic drug level monitoring: Secondary | ICD-10-CM | POA: Diagnosis not present

## 2022-03-24 DIAGNOSIS — Z86718 Personal history of other venous thrombosis and embolism: Secondary | ICD-10-CM | POA: Diagnosis not present

## 2022-03-24 DIAGNOSIS — Z792 Long term (current) use of antibiotics: Secondary | ICD-10-CM | POA: Diagnosis not present

## 2022-03-24 DIAGNOSIS — R7989 Other specified abnormal findings of blood chemistry: Secondary | ICD-10-CM | POA: Diagnosis not present

## 2022-03-24 DIAGNOSIS — Z7901 Long term (current) use of anticoagulants: Secondary | ICD-10-CM | POA: Diagnosis not present

## 2022-03-24 DIAGNOSIS — D849 Immunodeficiency, unspecified: Secondary | ICD-10-CM | POA: Diagnosis not present

## 2022-03-24 DIAGNOSIS — G8918 Other acute postprocedural pain: Secondary | ICD-10-CM | POA: Diagnosis not present

## 2022-03-24 DIAGNOSIS — T8643 Liver transplant infection: Secondary | ICD-10-CM | POA: Diagnosis not present

## 2022-03-24 DIAGNOSIS — Z944 Liver transplant status: Secondary | ICD-10-CM | POA: Diagnosis not present

## 2022-03-24 DIAGNOSIS — Z7682 Awaiting organ transplant status: Secondary | ICD-10-CM | POA: Diagnosis not present

## 2022-03-25 DIAGNOSIS — L7632 Postprocedural hematoma of skin and subcutaneous tissue following other procedure: Secondary | ICD-10-CM | POA: Diagnosis not present

## 2022-03-25 DIAGNOSIS — Z7901 Long term (current) use of anticoagulants: Secondary | ICD-10-CM | POA: Diagnosis not present

## 2022-03-25 DIAGNOSIS — Z944 Liver transplant status: Secondary | ICD-10-CM | POA: Diagnosis not present

## 2022-03-25 DIAGNOSIS — R739 Hyperglycemia, unspecified: Secondary | ICD-10-CM | POA: Diagnosis not present

## 2022-03-25 DIAGNOSIS — K567 Ileus, unspecified: Secondary | ICD-10-CM | POA: Diagnosis not present

## 2022-03-25 DIAGNOSIS — K5989 Other specified functional intestinal disorders: Secondary | ICD-10-CM | POA: Diagnosis not present

## 2022-03-25 DIAGNOSIS — T380X5A Adverse effect of glucocorticoids and synthetic analogues, initial encounter: Secondary | ICD-10-CM | POA: Diagnosis not present

## 2022-03-25 DIAGNOSIS — D849 Immunodeficiency, unspecified: Secondary | ICD-10-CM | POA: Diagnosis not present

## 2022-03-25 DIAGNOSIS — E1165 Type 2 diabetes mellitus with hyperglycemia: Secondary | ICD-10-CM | POA: Diagnosis not present

## 2022-03-25 DIAGNOSIS — S301XXD Contusion of abdominal wall, subsequent encounter: Secondary | ICD-10-CM | POA: Diagnosis not present

## 2022-03-26 DIAGNOSIS — S301XXD Contusion of abdominal wall, subsequent encounter: Secondary | ICD-10-CM | POA: Diagnosis not present

## 2022-03-26 DIAGNOSIS — D849 Immunodeficiency, unspecified: Secondary | ICD-10-CM | POA: Diagnosis not present

## 2022-03-26 DIAGNOSIS — T148XXA Other injury of unspecified body region, initial encounter: Secondary | ICD-10-CM | POA: Diagnosis not present

## 2022-03-26 DIAGNOSIS — K567 Ileus, unspecified: Secondary | ICD-10-CM | POA: Diagnosis not present

## 2022-03-26 DIAGNOSIS — Z7901 Long term (current) use of anticoagulants: Secondary | ICD-10-CM | POA: Diagnosis not present

## 2022-03-26 DIAGNOSIS — R739 Hyperglycemia, unspecified: Secondary | ICD-10-CM | POA: Diagnosis not present

## 2022-03-26 DIAGNOSIS — E1165 Type 2 diabetes mellitus with hyperglycemia: Secondary | ICD-10-CM | POA: Diagnosis not present

## 2022-03-26 DIAGNOSIS — Z944 Liver transplant status: Secondary | ICD-10-CM | POA: Diagnosis not present

## 2022-03-26 DIAGNOSIS — R7989 Other specified abnormal findings of blood chemistry: Secondary | ICD-10-CM | POA: Diagnosis not present

## 2022-03-26 DIAGNOSIS — T380X5A Adverse effect of glucocorticoids and synthetic analogues, initial encounter: Secondary | ICD-10-CM | POA: Diagnosis not present

## 2022-03-27 DIAGNOSIS — R739 Hyperglycemia, unspecified: Secondary | ICD-10-CM | POA: Diagnosis not present

## 2022-03-27 DIAGNOSIS — R7989 Other specified abnormal findings of blood chemistry: Secondary | ICD-10-CM | POA: Diagnosis not present

## 2022-03-27 DIAGNOSIS — K567 Ileus, unspecified: Secondary | ICD-10-CM | POA: Diagnosis not present

## 2022-03-27 DIAGNOSIS — D849 Immunodeficiency, unspecified: Secondary | ICD-10-CM | POA: Diagnosis not present

## 2022-03-27 DIAGNOSIS — T380X5A Adverse effect of glucocorticoids and synthetic analogues, initial encounter: Secondary | ICD-10-CM | POA: Diagnosis not present

## 2022-03-27 DIAGNOSIS — E1165 Type 2 diabetes mellitus with hyperglycemia: Secondary | ICD-10-CM | POA: Diagnosis not present

## 2022-03-27 DIAGNOSIS — Z944 Liver transplant status: Secondary | ICD-10-CM | POA: Diagnosis not present

## 2022-03-27 DIAGNOSIS — S301XXD Contusion of abdominal wall, subsequent encounter: Secondary | ICD-10-CM | POA: Diagnosis not present

## 2022-03-28 DIAGNOSIS — R739 Hyperglycemia, unspecified: Secondary | ICD-10-CM | POA: Diagnosis not present

## 2022-03-28 DIAGNOSIS — S301XXD Contusion of abdominal wall, subsequent encounter: Secondary | ICD-10-CM | POA: Diagnosis not present

## 2022-03-28 DIAGNOSIS — Z7901 Long term (current) use of anticoagulants: Secondary | ICD-10-CM | POA: Diagnosis not present

## 2022-03-28 DIAGNOSIS — J90 Pleural effusion, not elsewhere classified: Secondary | ICD-10-CM | POA: Diagnosis not present

## 2022-03-28 DIAGNOSIS — G4734 Idiopathic sleep related nonobstructive alveolar hypoventilation: Secondary | ICD-10-CM | POA: Diagnosis not present

## 2022-03-28 DIAGNOSIS — J9811 Atelectasis: Secondary | ICD-10-CM | POA: Diagnosis not present

## 2022-03-28 DIAGNOSIS — R7989 Other specified abnormal findings of blood chemistry: Secondary | ICD-10-CM | POA: Diagnosis not present

## 2022-03-28 DIAGNOSIS — K567 Ileus, unspecified: Secondary | ICD-10-CM | POA: Diagnosis not present

## 2022-03-28 DIAGNOSIS — D849 Immunodeficiency, unspecified: Secondary | ICD-10-CM | POA: Diagnosis not present

## 2022-03-28 DIAGNOSIS — Z944 Liver transplant status: Secondary | ICD-10-CM | POA: Diagnosis not present

## 2022-03-29 DIAGNOSIS — D849 Immunodeficiency, unspecified: Secondary | ICD-10-CM | POA: Diagnosis not present

## 2022-03-29 DIAGNOSIS — R739 Hyperglycemia, unspecified: Secondary | ICD-10-CM | POA: Diagnosis not present

## 2022-03-29 DIAGNOSIS — S301XXD Contusion of abdominal wall, subsequent encounter: Secondary | ICD-10-CM | POA: Diagnosis not present

## 2022-03-29 DIAGNOSIS — K567 Ileus, unspecified: Secondary | ICD-10-CM | POA: Diagnosis not present

## 2022-03-29 DIAGNOSIS — Z944 Liver transplant status: Secondary | ICD-10-CM | POA: Diagnosis not present

## 2022-03-29 DIAGNOSIS — T380X5A Adverse effect of glucocorticoids and synthetic analogues, initial encounter: Secondary | ICD-10-CM | POA: Diagnosis not present

## 2022-03-29 DIAGNOSIS — E1165 Type 2 diabetes mellitus with hyperglycemia: Secondary | ICD-10-CM | POA: Diagnosis not present

## 2022-03-29 DIAGNOSIS — R7989 Other specified abnormal findings of blood chemistry: Secondary | ICD-10-CM | POA: Diagnosis not present

## 2022-03-30 DIAGNOSIS — D849 Immunodeficiency, unspecified: Secondary | ICD-10-CM | POA: Diagnosis not present

## 2022-03-30 DIAGNOSIS — T380X5A Adverse effect of glucocorticoids and synthetic analogues, initial encounter: Secondary | ICD-10-CM | POA: Diagnosis not present

## 2022-03-30 DIAGNOSIS — R7989 Other specified abnormal findings of blood chemistry: Secondary | ICD-10-CM | POA: Diagnosis not present

## 2022-03-30 DIAGNOSIS — Z792 Long term (current) use of antibiotics: Secondary | ICD-10-CM | POA: Diagnosis not present

## 2022-03-30 DIAGNOSIS — Z86711 Personal history of pulmonary embolism: Secondary | ICD-10-CM | POA: Diagnosis not present

## 2022-03-30 DIAGNOSIS — E1169 Type 2 diabetes mellitus with other specified complication: Secondary | ICD-10-CM | POA: Diagnosis not present

## 2022-03-30 DIAGNOSIS — K567 Ileus, unspecified: Secondary | ICD-10-CM | POA: Diagnosis not present

## 2022-03-30 DIAGNOSIS — K7581 Nonalcoholic steatohepatitis (NASH): Secondary | ICD-10-CM | POA: Diagnosis not present

## 2022-03-30 DIAGNOSIS — Z7901 Long term (current) use of anticoagulants: Secondary | ICD-10-CM | POA: Diagnosis not present

## 2022-03-30 DIAGNOSIS — K760 Fatty (change of) liver, not elsewhere classified: Secondary | ICD-10-CM | POA: Diagnosis not present

## 2022-03-30 DIAGNOSIS — S301XXD Contusion of abdominal wall, subsequent encounter: Secondary | ICD-10-CM | POA: Diagnosis not present

## 2022-03-30 DIAGNOSIS — R739 Hyperglycemia, unspecified: Secondary | ICD-10-CM | POA: Diagnosis not present

## 2022-03-30 DIAGNOSIS — E1165 Type 2 diabetes mellitus with hyperglycemia: Secondary | ICD-10-CM | POA: Diagnosis not present

## 2022-03-30 DIAGNOSIS — C220 Liver cell carcinoma: Secondary | ICD-10-CM | POA: Diagnosis not present

## 2022-03-30 DIAGNOSIS — T8643 Liver transplant infection: Secondary | ICD-10-CM | POA: Diagnosis not present

## 2022-03-30 DIAGNOSIS — Z944 Liver transplant status: Secondary | ICD-10-CM | POA: Diagnosis not present

## 2022-03-30 DIAGNOSIS — G8918 Other acute postprocedural pain: Secondary | ICD-10-CM | POA: Diagnosis not present

## 2022-03-30 DIAGNOSIS — M549 Dorsalgia, unspecified: Secondary | ICD-10-CM | POA: Diagnosis not present

## 2022-03-31 DIAGNOSIS — R739 Hyperglycemia, unspecified: Secondary | ICD-10-CM | POA: Diagnosis not present

## 2022-03-31 DIAGNOSIS — K567 Ileus, unspecified: Secondary | ICD-10-CM | POA: Diagnosis not present

## 2022-03-31 DIAGNOSIS — Z944 Liver transplant status: Secondary | ICD-10-CM | POA: Diagnosis not present

## 2022-03-31 DIAGNOSIS — T380X5A Adverse effect of glucocorticoids and synthetic analogues, initial encounter: Secondary | ICD-10-CM | POA: Diagnosis not present

## 2022-03-31 DIAGNOSIS — S301XXD Contusion of abdominal wall, subsequent encounter: Secondary | ICD-10-CM | POA: Diagnosis not present

## 2022-03-31 DIAGNOSIS — E1165 Type 2 diabetes mellitus with hyperglycemia: Secondary | ICD-10-CM | POA: Diagnosis not present

## 2022-03-31 DIAGNOSIS — D849 Immunodeficiency, unspecified: Secondary | ICD-10-CM | POA: Diagnosis not present

## 2022-03-31 DIAGNOSIS — K91871 Postprocedural hematoma of a digestive system organ or structure following other procedure: Secondary | ICD-10-CM | POA: Diagnosis not present

## 2022-03-31 DIAGNOSIS — R7989 Other specified abnormal findings of blood chemistry: Secondary | ICD-10-CM | POA: Diagnosis not present

## 2022-04-01 DIAGNOSIS — T380X5A Adverse effect of glucocorticoids and synthetic analogues, initial encounter: Secondary | ICD-10-CM | POA: Diagnosis not present

## 2022-04-01 DIAGNOSIS — R739 Hyperglycemia, unspecified: Secondary | ICD-10-CM | POA: Diagnosis not present

## 2022-04-01 DIAGNOSIS — Z944 Liver transplant status: Secondary | ICD-10-CM | POA: Diagnosis not present

## 2022-04-01 DIAGNOSIS — E1165 Type 2 diabetes mellitus with hyperglycemia: Secondary | ICD-10-CM | POA: Diagnosis not present

## 2022-04-01 DIAGNOSIS — D849 Immunodeficiency, unspecified: Secondary | ICD-10-CM | POA: Diagnosis not present

## 2022-04-02 DIAGNOSIS — E1165 Type 2 diabetes mellitus with hyperglycemia: Secondary | ICD-10-CM | POA: Diagnosis not present

## 2022-04-02 DIAGNOSIS — R739 Hyperglycemia, unspecified: Secondary | ICD-10-CM | POA: Diagnosis not present

## 2022-04-02 DIAGNOSIS — Z7901 Long term (current) use of anticoagulants: Secondary | ICD-10-CM | POA: Diagnosis not present

## 2022-04-02 DIAGNOSIS — T380X5A Adverse effect of glucocorticoids and synthetic analogues, initial encounter: Secondary | ICD-10-CM | POA: Diagnosis not present

## 2022-04-02 DIAGNOSIS — Z944 Liver transplant status: Secondary | ICD-10-CM | POA: Diagnosis not present

## 2022-04-02 DIAGNOSIS — D849 Immunodeficiency, unspecified: Secondary | ICD-10-CM | POA: Diagnosis not present

## 2022-04-02 DIAGNOSIS — E1169 Type 2 diabetes mellitus with other specified complication: Secondary | ICD-10-CM | POA: Diagnosis not present

## 2022-04-02 DIAGNOSIS — Z792 Long term (current) use of antibiotics: Secondary | ICD-10-CM | POA: Diagnosis not present

## 2022-04-02 DIAGNOSIS — T8643 Liver transplant infection: Secondary | ICD-10-CM | POA: Diagnosis not present

## 2022-04-03 ENCOUNTER — Telehealth: Payer: Self-pay | Admitting: Family Medicine

## 2022-04-03 ENCOUNTER — Encounter: Payer: Self-pay | Admitting: Family Medicine

## 2022-04-03 DIAGNOSIS — Z944 Liver transplant status: Secondary | ICD-10-CM | POA: Insufficient documentation

## 2022-04-03 NOTE — Telephone Encounter (Signed)
Please check on patient regarding liver transplant at [facility].  My understanding is that he was recently discharged.  Please let me know if he needs to be seen here in the near future or if he is going to have follow-up at [facility].  Please let me know if there is any way I can be helpful here.  Thanks.

## 2022-04-05 DIAGNOSIS — D849 Immunodeficiency, unspecified: Secondary | ICD-10-CM | POA: Diagnosis not present

## 2022-04-05 DIAGNOSIS — Z944 Liver transplant status: Secondary | ICD-10-CM | POA: Diagnosis not present

## 2022-04-05 NOTE — Telephone Encounter (Signed)
Spoke with patients wife and she states he came home yesterday and is doing well. She stated that he will have many follow ups with [facility] and of course will see Dr. Damita Dunnings when things with the transplant calm down. She also stated that they got him set up with a endo at [facility] that he will see for his DM.

## 2022-04-07 NOTE — Telephone Encounter (Signed)
Noted. Thanks.

## 2022-04-08 DIAGNOSIS — Z7901 Long term (current) use of anticoagulants: Secondary | ICD-10-CM | POA: Diagnosis not present

## 2022-04-08 DIAGNOSIS — E1165 Type 2 diabetes mellitus with hyperglycemia: Secondary | ICD-10-CM | POA: Diagnosis not present

## 2022-04-08 DIAGNOSIS — T148XXA Other injury of unspecified body region, initial encounter: Secondary | ICD-10-CM | POA: Diagnosis not present

## 2022-04-08 DIAGNOSIS — D849 Immunodeficiency, unspecified: Secondary | ICD-10-CM | POA: Diagnosis not present

## 2022-04-08 DIAGNOSIS — Z86718 Personal history of other venous thrombosis and embolism: Secondary | ICD-10-CM | POA: Diagnosis not present

## 2022-04-08 DIAGNOSIS — G8929 Other chronic pain: Secondary | ICD-10-CM | POA: Diagnosis not present

## 2022-04-08 DIAGNOSIS — T8643 Liver transplant infection: Secondary | ICD-10-CM | POA: Diagnosis not present

## 2022-04-08 DIAGNOSIS — T8649 Other complications of liver transplant: Secondary | ICD-10-CM | POA: Diagnosis not present

## 2022-04-08 DIAGNOSIS — M549 Dorsalgia, unspecified: Secondary | ICD-10-CM | POA: Diagnosis not present

## 2022-04-08 DIAGNOSIS — D649 Anemia, unspecified: Secondary | ICD-10-CM | POA: Diagnosis not present

## 2022-04-08 DIAGNOSIS — Z79899 Other long term (current) drug therapy: Secondary | ICD-10-CM | POA: Diagnosis not present

## 2022-04-08 DIAGNOSIS — G8918 Other acute postprocedural pain: Secondary | ICD-10-CM | POA: Diagnosis not present

## 2022-04-08 DIAGNOSIS — I10 Essential (primary) hypertension: Secondary | ICD-10-CM | POA: Diagnosis not present

## 2022-04-08 DIAGNOSIS — C220 Liver cell carcinoma: Secondary | ICD-10-CM | POA: Diagnosis not present

## 2022-04-08 DIAGNOSIS — Z86711 Personal history of pulmonary embolism: Secondary | ICD-10-CM | POA: Diagnosis not present

## 2022-04-08 DIAGNOSIS — Z792 Long term (current) use of antibiotics: Secondary | ICD-10-CM | POA: Diagnosis not present

## 2022-04-08 DIAGNOSIS — T380X5A Adverse effect of glucocorticoids and synthetic analogues, initial encounter: Secondary | ICD-10-CM | POA: Diagnosis not present

## 2022-04-08 DIAGNOSIS — D84821 Immunodeficiency due to drugs: Secondary | ICD-10-CM | POA: Diagnosis not present

## 2022-04-08 DIAGNOSIS — Z9189 Other specified personal risk factors, not elsewhere classified: Secondary | ICD-10-CM | POA: Diagnosis not present

## 2022-04-08 DIAGNOSIS — E875 Hyperkalemia: Secondary | ICD-10-CM | POA: Diagnosis not present

## 2022-04-08 DIAGNOSIS — Z944 Liver transplant status: Secondary | ICD-10-CM | POA: Diagnosis not present

## 2022-04-08 DIAGNOSIS — Z8505 Personal history of malignant neoplasm of liver: Secondary | ICD-10-CM | POA: Diagnosis not present

## 2022-04-11 DIAGNOSIS — T8643 Liver transplant infection: Secondary | ICD-10-CM | POA: Diagnosis not present

## 2022-04-13 DIAGNOSIS — E1165 Type 2 diabetes mellitus with hyperglycemia: Secondary | ICD-10-CM | POA: Diagnosis not present

## 2022-04-17 DIAGNOSIS — D849 Immunodeficiency, unspecified: Secondary | ICD-10-CM | POA: Diagnosis not present

## 2022-04-17 DIAGNOSIS — Z944 Liver transplant status: Secondary | ICD-10-CM | POA: Diagnosis not present

## 2022-04-23 DIAGNOSIS — E1169 Type 2 diabetes mellitus with other specified complication: Secondary | ICD-10-CM | POA: Diagnosis not present

## 2022-04-23 DIAGNOSIS — Z794 Long term (current) use of insulin: Secondary | ICD-10-CM | POA: Diagnosis not present

## 2022-04-24 DIAGNOSIS — Z9189 Other specified personal risk factors, not elsewhere classified: Secondary | ICD-10-CM | POA: Diagnosis not present

## 2022-04-24 DIAGNOSIS — M549 Dorsalgia, unspecified: Secondary | ICD-10-CM | POA: Diagnosis not present

## 2022-04-24 DIAGNOSIS — E1165 Type 2 diabetes mellitus with hyperglycemia: Secondary | ICD-10-CM | POA: Diagnosis not present

## 2022-04-24 DIAGNOSIS — E875 Hyperkalemia: Secondary | ICD-10-CM | POA: Diagnosis not present

## 2022-04-24 DIAGNOSIS — D649 Anemia, unspecified: Secondary | ICD-10-CM | POA: Diagnosis not present

## 2022-04-24 DIAGNOSIS — I10 Essential (primary) hypertension: Secondary | ICD-10-CM | POA: Diagnosis not present

## 2022-04-24 DIAGNOSIS — G8929 Other chronic pain: Secondary | ICD-10-CM | POA: Diagnosis not present

## 2022-04-24 DIAGNOSIS — D849 Immunodeficiency, unspecified: Secondary | ICD-10-CM | POA: Diagnosis not present

## 2022-04-24 DIAGNOSIS — Z86711 Personal history of pulmonary embolism: Secondary | ICD-10-CM | POA: Diagnosis not present

## 2022-04-24 DIAGNOSIS — T148XXA Other injury of unspecified body region, initial encounter: Secondary | ICD-10-CM | POA: Diagnosis not present

## 2022-04-24 DIAGNOSIS — T380X5A Adverse effect of glucocorticoids and synthetic analogues, initial encounter: Secondary | ICD-10-CM | POA: Diagnosis not present

## 2022-04-24 DIAGNOSIS — Z944 Liver transplant status: Secondary | ICD-10-CM | POA: Diagnosis not present

## 2022-04-24 DIAGNOSIS — Z86718 Personal history of other venous thrombosis and embolism: Secondary | ICD-10-CM | POA: Diagnosis not present

## 2022-04-24 DIAGNOSIS — Z792 Long term (current) use of antibiotics: Secondary | ICD-10-CM | POA: Diagnosis not present

## 2022-04-24 DIAGNOSIS — G8918 Other acute postprocedural pain: Secondary | ICD-10-CM | POA: Diagnosis not present

## 2022-04-24 DIAGNOSIS — T8643 Liver transplant infection: Secondary | ICD-10-CM | POA: Diagnosis not present

## 2022-04-25 LAB — HM DIABETES EYE EXAM

## 2022-04-29 DIAGNOSIS — Z944 Liver transplant status: Secondary | ICD-10-CM | POA: Diagnosis not present

## 2022-04-29 DIAGNOSIS — D849 Immunodeficiency, unspecified: Secondary | ICD-10-CM | POA: Diagnosis not present

## 2022-04-29 DIAGNOSIS — Z9482 Intestine transplant status: Secondary | ICD-10-CM | POA: Diagnosis not present

## 2022-05-07 DIAGNOSIS — T8643 Liver transplant infection: Secondary | ICD-10-CM | POA: Diagnosis not present

## 2022-05-07 DIAGNOSIS — E875 Hyperkalemia: Secondary | ICD-10-CM | POA: Diagnosis not present

## 2022-05-07 DIAGNOSIS — I10 Essential (primary) hypertension: Secondary | ICD-10-CM | POA: Diagnosis not present

## 2022-05-07 DIAGNOSIS — Z86718 Personal history of other venous thrombosis and embolism: Secondary | ICD-10-CM | POA: Diagnosis not present

## 2022-05-07 DIAGNOSIS — Z87891 Personal history of nicotine dependence: Secondary | ICD-10-CM | POA: Diagnosis not present

## 2022-05-07 DIAGNOSIS — D849 Immunodeficiency, unspecified: Secondary | ICD-10-CM | POA: Diagnosis not present

## 2022-05-07 DIAGNOSIS — Z944 Liver transplant status: Secondary | ICD-10-CM | POA: Diagnosis not present

## 2022-05-07 DIAGNOSIS — C220 Liver cell carcinoma: Secondary | ICD-10-CM | POA: Diagnosis not present

## 2022-05-07 DIAGNOSIS — E1169 Type 2 diabetes mellitus with other specified complication: Secondary | ICD-10-CM | POA: Diagnosis not present

## 2022-05-07 DIAGNOSIS — K7460 Unspecified cirrhosis of liver: Secondary | ICD-10-CM | POA: Diagnosis not present

## 2022-05-14 DIAGNOSIS — D849 Immunodeficiency, unspecified: Secondary | ICD-10-CM | POA: Diagnosis not present

## 2022-05-14 DIAGNOSIS — Z944 Liver transplant status: Secondary | ICD-10-CM | POA: Diagnosis not present

## 2022-05-14 DIAGNOSIS — E1165 Type 2 diabetes mellitus with hyperglycemia: Secondary | ICD-10-CM | POA: Diagnosis not present

## 2022-05-14 DIAGNOSIS — Z9482 Intestine transplant status: Secondary | ICD-10-CM | POA: Diagnosis not present

## 2022-05-17 DIAGNOSIS — H52223 Regular astigmatism, bilateral: Secondary | ICD-10-CM | POA: Diagnosis not present

## 2022-05-17 DIAGNOSIS — H25013 Cortical age-related cataract, bilateral: Secondary | ICD-10-CM | POA: Diagnosis not present

## 2022-05-17 DIAGNOSIS — H2513 Age-related nuclear cataract, bilateral: Secondary | ICD-10-CM | POA: Diagnosis not present

## 2022-05-17 DIAGNOSIS — H5203 Hypermetropia, bilateral: Secondary | ICD-10-CM | POA: Diagnosis not present

## 2022-05-17 DIAGNOSIS — E119 Type 2 diabetes mellitus without complications: Secondary | ICD-10-CM | POA: Diagnosis not present

## 2022-05-17 DIAGNOSIS — Z79899 Other long term (current) drug therapy: Secondary | ICD-10-CM | POA: Diagnosis not present

## 2022-05-17 DIAGNOSIS — H524 Presbyopia: Secondary | ICD-10-CM | POA: Diagnosis not present

## 2022-05-21 DIAGNOSIS — D849 Immunodeficiency, unspecified: Secondary | ICD-10-CM | POA: Diagnosis not present

## 2022-05-21 DIAGNOSIS — Z944 Liver transplant status: Secondary | ICD-10-CM | POA: Diagnosis not present

## 2022-05-21 DIAGNOSIS — T8643 Liver transplant infection: Secondary | ICD-10-CM | POA: Diagnosis not present

## 2022-05-21 DIAGNOSIS — Z9482 Intestine transplant status: Secondary | ICD-10-CM | POA: Diagnosis not present

## 2022-05-28 DIAGNOSIS — D849 Immunodeficiency, unspecified: Secondary | ICD-10-CM | POA: Diagnosis not present

## 2022-05-28 DIAGNOSIS — Z944 Liver transplant status: Secondary | ICD-10-CM | POA: Diagnosis not present

## 2022-05-28 DIAGNOSIS — Z9482 Intestine transplant status: Secondary | ICD-10-CM | POA: Diagnosis not present

## 2022-06-04 DIAGNOSIS — Z944 Liver transplant status: Secondary | ICD-10-CM | POA: Diagnosis not present

## 2022-06-04 DIAGNOSIS — D849 Immunodeficiency, unspecified: Secondary | ICD-10-CM | POA: Diagnosis not present

## 2022-06-07 ENCOUNTER — Other Ambulatory Visit: Payer: Medicare Other

## 2022-06-07 ENCOUNTER — Ambulatory Visit: Payer: Medicare Other | Admitting: Oncology

## 2022-06-11 DIAGNOSIS — Z944 Liver transplant status: Secondary | ICD-10-CM | POA: Diagnosis not present

## 2022-06-11 DIAGNOSIS — D849 Immunodeficiency, unspecified: Secondary | ICD-10-CM | POA: Diagnosis not present

## 2022-06-11 DIAGNOSIS — Z9482 Intestine transplant status: Secondary | ICD-10-CM | POA: Diagnosis not present

## 2022-06-14 DIAGNOSIS — E1165 Type 2 diabetes mellitus with hyperglycemia: Secondary | ICD-10-CM | POA: Diagnosis not present

## 2022-06-25 DIAGNOSIS — Z9482 Intestine transplant status: Secondary | ICD-10-CM | POA: Diagnosis not present

## 2022-06-25 DIAGNOSIS — Z944 Liver transplant status: Secondary | ICD-10-CM | POA: Diagnosis not present

## 2022-06-25 DIAGNOSIS — E1169 Type 2 diabetes mellitus with other specified complication: Secondary | ICD-10-CM | POA: Diagnosis not present

## 2022-06-25 DIAGNOSIS — D849 Immunodeficiency, unspecified: Secondary | ICD-10-CM | POA: Diagnosis not present

## 2022-06-26 ENCOUNTER — Other Ambulatory Visit: Payer: Self-pay | Admitting: Family Medicine

## 2022-07-09 DIAGNOSIS — Z9482 Intestine transplant status: Secondary | ICD-10-CM | POA: Diagnosis not present

## 2022-07-09 DIAGNOSIS — Z944 Liver transplant status: Secondary | ICD-10-CM | POA: Diagnosis not present

## 2022-07-09 DIAGNOSIS — D849 Immunodeficiency, unspecified: Secondary | ICD-10-CM | POA: Diagnosis not present

## 2022-07-14 DIAGNOSIS — E1165 Type 2 diabetes mellitus with hyperglycemia: Secondary | ICD-10-CM | POA: Diagnosis not present

## 2022-07-16 DIAGNOSIS — Z9482 Intestine transplant status: Secondary | ICD-10-CM | POA: Diagnosis not present

## 2022-07-16 DIAGNOSIS — Z944 Liver transplant status: Secondary | ICD-10-CM | POA: Diagnosis not present

## 2022-07-16 DIAGNOSIS — D849 Immunodeficiency, unspecified: Secondary | ICD-10-CM | POA: Diagnosis not present

## 2022-07-23 DIAGNOSIS — Z944 Liver transplant status: Secondary | ICD-10-CM | POA: Diagnosis not present

## 2022-07-23 DIAGNOSIS — Z9482 Intestine transplant status: Secondary | ICD-10-CM | POA: Diagnosis not present

## 2022-07-23 DIAGNOSIS — D849 Immunodeficiency, unspecified: Secondary | ICD-10-CM | POA: Diagnosis not present

## 2022-08-06 DIAGNOSIS — D849 Immunodeficiency, unspecified: Secondary | ICD-10-CM | POA: Diagnosis not present

## 2022-08-06 DIAGNOSIS — Z944 Liver transplant status: Secondary | ICD-10-CM | POA: Diagnosis not present

## 2022-08-13 DIAGNOSIS — E1165 Type 2 diabetes mellitus with hyperglycemia: Secondary | ICD-10-CM | POA: Diagnosis not present

## 2022-08-23 DIAGNOSIS — Z86718 Personal history of other venous thrombosis and embolism: Secondary | ICD-10-CM | POA: Diagnosis not present

## 2022-08-23 DIAGNOSIS — D849 Immunodeficiency, unspecified: Secondary | ICD-10-CM | POA: Diagnosis not present

## 2022-08-23 DIAGNOSIS — Z86711 Personal history of pulmonary embolism: Secondary | ICD-10-CM | POA: Diagnosis not present

## 2022-08-23 DIAGNOSIS — D500 Iron deficiency anemia secondary to blood loss (chronic): Secondary | ICD-10-CM | POA: Diagnosis not present

## 2022-08-23 DIAGNOSIS — Z944 Liver transplant status: Secondary | ICD-10-CM | POA: Diagnosis not present

## 2022-08-23 DIAGNOSIS — Z7901 Long term (current) use of anticoagulants: Secondary | ICD-10-CM | POA: Diagnosis not present

## 2022-08-30 ENCOUNTER — Telehealth: Payer: Self-pay | Admitting: Family Medicine

## 2022-08-30 NOTE — Telephone Encounter (Signed)
Morrie Sheldon from [employer] called to know status of fax that was sent requesting pt most resent office noted . # [phone] ext [phone] and Fax [phone]

## 2022-08-30 NOTE — Telephone Encounter (Signed)
Have not received any requests on this patient.

## 2022-09-03 DIAGNOSIS — G8929 Other chronic pain: Secondary | ICD-10-CM | POA: Diagnosis not present

## 2022-09-03 DIAGNOSIS — E119 Type 2 diabetes mellitus without complications: Secondary | ICD-10-CM | POA: Diagnosis not present

## 2022-09-03 DIAGNOSIS — M549 Dorsalgia, unspecified: Secondary | ICD-10-CM | POA: Diagnosis not present

## 2022-09-03 DIAGNOSIS — K862 Cyst of pancreas: Secondary | ICD-10-CM | POA: Diagnosis not present

## 2022-09-03 DIAGNOSIS — C220 Liver cell carcinoma: Secondary | ICD-10-CM | POA: Diagnosis not present

## 2022-09-03 DIAGNOSIS — Z7901 Long term (current) use of anticoagulants: Secondary | ICD-10-CM | POA: Diagnosis not present

## 2022-09-03 DIAGNOSIS — Z86718 Personal history of other venous thrombosis and embolism: Secondary | ICD-10-CM | POA: Diagnosis not present

## 2022-09-03 DIAGNOSIS — Z87891 Personal history of nicotine dependence: Secondary | ICD-10-CM | POA: Diagnosis not present

## 2022-09-03 DIAGNOSIS — G8918 Other acute postprocedural pain: Secondary | ICD-10-CM | POA: Diagnosis not present

## 2022-09-03 DIAGNOSIS — Z944 Liver transplant status: Secondary | ICD-10-CM | POA: Diagnosis not present

## 2022-09-03 DIAGNOSIS — Z86711 Personal history of pulmonary embolism: Secondary | ICD-10-CM | POA: Diagnosis not present

## 2022-09-04 ENCOUNTER — Telehealth: Payer: Self-pay | Admitting: Family Medicine

## 2022-09-04 NOTE — Telephone Encounter (Signed)
Called Tim Hodge back and left message on her VM that we have not seen patient since 01/11/22; therefore we will not be sending any updated notes.

## 2022-09-04 NOTE — Telephone Encounter (Signed)
Morrie Sheldon from [employer] called over and stated that they are needing updated chart notes from the past 6 months. She stated that the notes can be faxed over [phone]. Thank you!

## 2022-09-12 DIAGNOSIS — E1165 Type 2 diabetes mellitus with hyperglycemia: Secondary | ICD-10-CM | POA: Diagnosis not present

## 2022-09-17 DIAGNOSIS — D849 Immunodeficiency, unspecified: Secondary | ICD-10-CM | POA: Diagnosis not present

## 2022-09-17 DIAGNOSIS — Z944 Liver transplant status: Secondary | ICD-10-CM | POA: Diagnosis not present

## 2022-09-19 DIAGNOSIS — J849 Interstitial pulmonary disease, unspecified: Secondary | ICD-10-CM | POA: Diagnosis not present

## 2022-09-19 DIAGNOSIS — Z86711 Personal history of pulmonary embolism: Secondary | ICD-10-CM | POA: Diagnosis not present

## 2022-09-19 DIAGNOSIS — J811 Chronic pulmonary edema: Secondary | ICD-10-CM | POA: Diagnosis not present

## 2022-09-19 DIAGNOSIS — E1165 Type 2 diabetes mellitus with hyperglycemia: Secondary | ICD-10-CM | POA: Diagnosis not present

## 2022-09-19 DIAGNOSIS — K219 Gastro-esophageal reflux disease without esophagitis: Secondary | ICD-10-CM | POA: Diagnosis not present

## 2022-09-19 DIAGNOSIS — E1169 Type 2 diabetes mellitus with other specified complication: Secondary | ICD-10-CM | POA: Diagnosis not present

## 2022-09-19 DIAGNOSIS — Z8505 Personal history of malignant neoplasm of liver: Secondary | ICD-10-CM | POA: Diagnosis not present

## 2022-09-19 DIAGNOSIS — Z86718 Personal history of other venous thrombosis and embolism: Secondary | ICD-10-CM | POA: Diagnosis not present

## 2022-09-19 DIAGNOSIS — Z87891 Personal history of nicotine dependence: Secondary | ICD-10-CM | POA: Diagnosis not present

## 2022-09-19 DIAGNOSIS — K7460 Unspecified cirrhosis of liver: Secondary | ICD-10-CM | POA: Diagnosis not present

## 2022-10-01 DIAGNOSIS — D849 Immunodeficiency, unspecified: Secondary | ICD-10-CM | POA: Diagnosis not present

## 2022-10-01 DIAGNOSIS — Z944 Liver transplant status: Secondary | ICD-10-CM | POA: Diagnosis not present

## 2022-10-07 DIAGNOSIS — D849 Immunodeficiency, unspecified: Secondary | ICD-10-CM | POA: Diagnosis not present

## 2022-10-07 DIAGNOSIS — Z944 Liver transplant status: Secondary | ICD-10-CM | POA: Diagnosis not present

## 2022-10-10 DIAGNOSIS — I130 Hypertensive heart and chronic kidney disease with heart failure and stage 1 through stage 4 chronic kidney disease, or unspecified chronic kidney disease: Secondary | ICD-10-CM | POA: Diagnosis not present

## 2022-10-10 DIAGNOSIS — Z87891 Personal history of nicotine dependence: Secondary | ICD-10-CM | POA: Diagnosis not present

## 2022-10-10 DIAGNOSIS — Z86711 Personal history of pulmonary embolism: Secondary | ICD-10-CM | POA: Diagnosis not present

## 2022-10-10 DIAGNOSIS — K219 Gastro-esophageal reflux disease without esophagitis: Secondary | ICD-10-CM | POA: Diagnosis not present

## 2022-10-10 DIAGNOSIS — N1831 Chronic kidney disease, stage 3a: Secondary | ICD-10-CM | POA: Diagnosis not present

## 2022-10-10 DIAGNOSIS — Z794 Long term (current) use of insulin: Secondary | ICD-10-CM | POA: Diagnosis not present

## 2022-10-10 DIAGNOSIS — K7460 Unspecified cirrhosis of liver: Secondary | ICD-10-CM | POA: Diagnosis not present

## 2022-10-10 DIAGNOSIS — Z944 Liver transplant status: Secondary | ICD-10-CM | POA: Diagnosis not present

## 2022-10-10 DIAGNOSIS — Z86718 Personal history of other venous thrombosis and embolism: Secondary | ICD-10-CM | POA: Diagnosis not present

## 2022-10-10 DIAGNOSIS — K862 Cyst of pancreas: Secondary | ICD-10-CM | POA: Diagnosis not present

## 2022-10-10 DIAGNOSIS — Z7984 Long term (current) use of oral hypoglycemic drugs: Secondary | ICD-10-CM | POA: Diagnosis not present

## 2022-10-10 DIAGNOSIS — K769 Liver disease, unspecified: Secondary | ICD-10-CM | POA: Diagnosis not present

## 2022-10-10 DIAGNOSIS — E1122 Type 2 diabetes mellitus with diabetic chronic kidney disease: Secondary | ICD-10-CM | POA: Diagnosis not present

## 2022-10-10 DIAGNOSIS — Z888 Allergy status to other drugs, medicaments and biological substances status: Secondary | ICD-10-CM | POA: Diagnosis not present

## 2022-10-10 DIAGNOSIS — K8689 Other specified diseases of pancreas: Secondary | ICD-10-CM | POA: Diagnosis not present

## 2022-10-10 DIAGNOSIS — K3189 Other diseases of stomach and duodenum: Secondary | ICD-10-CM | POA: Diagnosis not present

## 2022-10-10 DIAGNOSIS — K869 Disease of pancreas, unspecified: Secondary | ICD-10-CM | POA: Diagnosis not present

## 2022-10-10 DIAGNOSIS — I509 Heart failure, unspecified: Secondary | ICD-10-CM | POA: Diagnosis not present

## 2022-10-16 DIAGNOSIS — D849 Immunodeficiency, unspecified: Secondary | ICD-10-CM | POA: Diagnosis not present

## 2022-10-16 DIAGNOSIS — Z944 Liver transplant status: Secondary | ICD-10-CM | POA: Diagnosis not present

## 2022-10-16 DIAGNOSIS — E1165 Type 2 diabetes mellitus with hyperglycemia: Secondary | ICD-10-CM | POA: Diagnosis not present

## 2022-11-07 DIAGNOSIS — K862 Cyst of pancreas: Secondary | ICD-10-CM | POA: Diagnosis not present

## 2022-11-12 DIAGNOSIS — Z944 Liver transplant status: Secondary | ICD-10-CM | POA: Diagnosis not present

## 2022-11-12 DIAGNOSIS — D849 Immunodeficiency, unspecified: Secondary | ICD-10-CM | POA: Diagnosis not present

## 2022-11-15 DIAGNOSIS — E1165 Type 2 diabetes mellitus with hyperglycemia: Secondary | ICD-10-CM | POA: Diagnosis not present

## 2022-12-10 DIAGNOSIS — Z23 Encounter for immunization: Secondary | ICD-10-CM | POA: Diagnosis not present

## 2022-12-10 DIAGNOSIS — K862 Cyst of pancreas: Secondary | ICD-10-CM | POA: Diagnosis not present

## 2022-12-10 DIAGNOSIS — E1169 Type 2 diabetes mellitus with other specified complication: Secondary | ICD-10-CM | POA: Diagnosis not present

## 2022-12-10 DIAGNOSIS — K7460 Unspecified cirrhosis of liver: Secondary | ICD-10-CM | POA: Diagnosis not present

## 2022-12-10 DIAGNOSIS — Z86718 Personal history of other venous thrombosis and embolism: Secondary | ICD-10-CM | POA: Diagnosis not present

## 2022-12-10 DIAGNOSIS — Z7901 Long term (current) use of anticoagulants: Secondary | ICD-10-CM | POA: Diagnosis not present

## 2022-12-10 DIAGNOSIS — C220 Liver cell carcinoma: Secondary | ICD-10-CM | POA: Diagnosis not present

## 2022-12-10 DIAGNOSIS — E1165 Type 2 diabetes mellitus with hyperglycemia: Secondary | ICD-10-CM | POA: Diagnosis not present

## 2022-12-10 DIAGNOSIS — Z87891 Personal history of nicotine dependence: Secondary | ICD-10-CM | POA: Diagnosis not present

## 2022-12-10 DIAGNOSIS — Z4823 Encounter for aftercare following liver transplant: Secondary | ICD-10-CM | POA: Diagnosis not present

## 2022-12-10 DIAGNOSIS — E1122 Type 2 diabetes mellitus with diabetic chronic kidney disease: Secondary | ICD-10-CM | POA: Diagnosis not present

## 2022-12-10 DIAGNOSIS — Z86711 Personal history of pulmonary embolism: Secondary | ICD-10-CM | POA: Diagnosis not present

## 2022-12-10 DIAGNOSIS — N1831 Chronic kidney disease, stage 3a: Secondary | ICD-10-CM | POA: Diagnosis not present

## 2022-12-10 DIAGNOSIS — Z79624 Long term (current) use of inhibitors of nucleotide synthesis: Secondary | ICD-10-CM | POA: Diagnosis not present

## 2022-12-10 DIAGNOSIS — Z944 Liver transplant status: Secondary | ICD-10-CM | POA: Diagnosis not present

## 2022-12-15 DIAGNOSIS — E1165 Type 2 diabetes mellitus with hyperglycemia: Secondary | ICD-10-CM | POA: Diagnosis not present

## 2023-01-10 DIAGNOSIS — Z944 Liver transplant status: Secondary | ICD-10-CM | POA: Diagnosis not present

## 2023-01-10 DIAGNOSIS — D849 Immunodeficiency, unspecified: Secondary | ICD-10-CM | POA: Diagnosis not present

## 2023-01-21 DIAGNOSIS — E1165 Type 2 diabetes mellitus with hyperglycemia: Secondary | ICD-10-CM | POA: Diagnosis not present

## 2023-02-03 ENCOUNTER — Telehealth: Payer: Self-pay | Admitting: Family Medicine

## 2023-02-03 NOTE — Telephone Encounter (Signed)
Received call back from patient spouse order no longer needed

## 2023-02-03 NOTE — Telephone Encounter (Signed)
Received orders from [facility] in reference to CGM supplies. Called patient left vm asking is this something that he indeed requested.

## 2023-02-03 NOTE — Telephone Encounter (Signed)
Patients wife returned call,and said that he has already received the CGM supplies,so the order can be disregarded.

## 2023-02-09 ENCOUNTER — Other Ambulatory Visit: Payer: Self-pay | Admitting: Family Medicine

## 2023-02-09 DIAGNOSIS — E119 Type 2 diabetes mellitus without complications: Secondary | ICD-10-CM

## 2023-02-10 ENCOUNTER — Ambulatory Visit (INDEPENDENT_AMBULATORY_CARE_PROVIDER_SITE_OTHER): Payer: Medicare Other

## 2023-02-10 ENCOUNTER — Telehealth: Payer: Self-pay

## 2023-02-10 VITALS — Ht 72.0 in | Wt 233.0 lb

## 2023-02-10 DIAGNOSIS — Z0000 Encounter for general adult medical examination without abnormal findings: Secondary | ICD-10-CM

## 2023-02-10 NOTE — Patient Instructions (Signed)
Tim Hodge , Thank you for taking time to come for your Medicare Wellness Visit. I appreciate your ongoing commitment to your health goals. Please review the following plan we discussed and let me know if I can assist you in the future.   Referrals/Orders/Follow-Ups/Clinician Recommendations: Aim for 30 minutes of exercise or brisk walking, 6-8 glasses of water, and 5 servings of fruits and vegetables each day.   This is a list of the screening recommended for you and due dates:  Health Maintenance  Topic Date Due   Yearly kidney health urinalysis for diabetes  Never done   Zoster (Shingles) Vaccine (1 of 2) Never done   Pneumonia Vaccine (2 of 2 - PCV) 04/22/2013   Hemoglobin A1C  05/01/2022   Complete foot exam   08/07/2022   Flu Shot  11/21/2022   COVID-19 Vaccine (4 - 2023-24 season) 12/22/2022   Yearly kidney function blood test for diabetes  01/19/2023   Eye exam for diabetics  04/26/2023   Medicare Annual Wellness Visit  02/10/2024   Colon Cancer Screening  11/09/2024   DTaP/Tdap/Td vaccine (4 - Td or Tdap) 09/18/2031   Hepatitis C Screening  Completed   HPV Vaccine  Aged Out    Advanced directives: (Provided) Advance directive discussed with you today. I have provided a copy for you to complete at home and have notarized. Once this is complete, please bring a copy in to our office so we can scan it into your chart. Information on Advanced Care Planning can be found at [facility] [place] Advance Health Care Directives Advance Health Care Directives ([email or web address]    Next Medicare Annual Wellness Visit scheduled for next year: Yes  insert Preventive Care attachment Insert FALL PREVENTION attachment if needed

## 2023-02-10 NOTE — Telephone Encounter (Signed)
Called patient left message with patients spouse for patient to return call. Received rx request for [employer] medical supplies to sign off on a CGM. I was reaching out to patient to verify whether or not he requested items

## 2023-02-10 NOTE — Progress Notes (Signed)
Subjective:   Tim Hodge is a 67 y.o. male who presents for Medicare Annual/Subsequent preventive examination.  Visit Complete: Virtual I connected with  Tim Hodge on 02/10/23 by a audio enabled telemedicine application and verified that I am speaking with the correct person using two identifiers.  Patient Location: Home  Provider Location: Home Office  I discussed the limitations of evaluation and management by telemedicine. The patient expressed understanding and agreed to proceed.  Vital Signs: Because this visit was a virtual/telehealth visit, some criteria may be missing or patient reported. Any vitals not documented were not able to be obtained and vitals that have been documented are patient reported.  Patient Medicare AWV questionnaire was completed by the patient on 02/10/2023; I have confirmed that all information answered by patient is correct and no changes since this date.  Cardiac Risk Factors include: advanced age (>51men, >33 women);diabetes mellitus;dyslipidemia;male gender;hypertension     Objective:    Today's Vitals   02/10/23 1008  Weight: 233 lb (105.7 kg)  Height: 6' (1.829 m)   Body mass index is 31.6 kg/m.     02/10/2023   10:15 AM 02/25/2022   11:37 AM 02/05/2022    9:33 AM 01/09/2022    1:00 PM 01/06/2022   12:35 PM 01/03/2022   10:18 AM 11/26/2021   10:53 AM  Advanced Directives  Does Patient Have a Medical Advance Directive? No No No No No No No  Would patient like information on creating a medical advance directive? Yes (MAU/Ambulatory/Procedural Areas - Information given) No - Patient declined No - Patient declined No - Patient declined No - Patient declined  No - Patient declined    Current Medications (verified) Outpatient Encounter Medications as of 02/10/2023  Medication Sig   Continuous Blood Gluc Receiver (DEXCOM G7 RECEIVER) DEVI by Does not apply route.   Continuous Blood Gluc Sensor (DEXCOM G7 SENSOR) MISC Apply sensor  every 10 days   ELIQUIS 5 MG TABS tablet Take 5 mg by mouth 2 (two) times daily.   ferrous sulfate 325 (65 FE) MG tablet Take by mouth.   magnesium oxide (MAG-OX) 400 MG tablet Take by mouth.   mycophenolate (CELLCEPT) 250 MG capsule Take by mouth.   tacrolimus (PROGRAF) 1 MG capsule Take by mouth.   Dulaglutide (TRULICITY) 0.75 MG/0.5ML SOPN Inject 0.75 mg into the skin once a week.   fluticasone (FLONASE) 50 MCG/ACT nasal spray Place 2 sprays into both nostrils daily.   furosemide (LASIX) 40 MG tablet Take 1 tablet (40 mg total) by mouth daily. Take extra 40 mg for weight gain of 3 lbs in 1 day or 5 lbs 1 week   Krill Oil 1000 MG CAPS Take 2,000 mg by mouth daily.   lactulose (CHRONULAC) 10 GM/15ML solution TAKE 15 MLS (10 GRAMS) BY MOUTH 2 TIMES DAILY AS NEEDED   oxyCODONE-acetaminophen (PERCOCET) 10-325 MG tablet Take 1 tablet by mouth every 6 (six) hours as needed for pain (for back pain).   pantoprazole (PROTONIX) 40 MG tablet TAKE 1 TABLET BY MOUTH ONCE A DAY   saccharomyces boulardii (FLORASTOR) 250 MG capsule Take 1 capsule (250 mg total) by mouth daily.   spironolactone (ALDACTONE) 100 MG tablet Take 100 mg by mouth daily.   XARELTO 20 MG TABS tablet TAKE 1 TABLET BY MOUTH ONCE DAILY WITH SUPPER (Patient not taking: Reported on 02/10/2023)   No facility-administered encounter medications on file as of 02/10/2023.    Allergies (verified) Iodinated contrast media, Flexeril [cyclobenzaprine],  Iodine, Tramadol, Vioxx [rofecoxib], Metformin and related, and Valium [diazepam]   History: Past Medical History:  Diagnosis Date   Acute venous embolism and thrombosis of unspecified deep vessels of lower extremity    Cancer (HCC)    CHF (congestive heart failure) (HCC)    Cirrhosis of liver (HCC)    due to fatty liver   Compression fx, lumbar spine (HCC)    2017   Elevated blood pressure reading without diagnosis of hypertension    Headache    Liver transplant recipient [facility])     Lumbago    Peripheral vascular disease (HCC)    Polyp of colon    Pulmonary embolism (HCC)    Type II or unspecified type diabetes mellitus without mention of complication, not stated as uncontrolled    Unspecified arthropathy, ankle and foot    Past Surgical History:  Procedure Laterality Date   COLONOSCOPY WITH PROPOFOL N/A 11/10/2019   Procedure: COLONOSCOPY WITH PROPOFOL;  Surgeon: Toledo, Boykin Nearing, MD;  Location: [facility] ENDOSCOPY;  Service: Gastroenterology;  Laterality: N/A;   CYSTOSCOPY W/ RETROGRADES Bilateral 11/26/2021   Procedure: CYSTOSCOPY WITH RETROGRADE PYELOGRAM;  Surgeon: Vanna Scotland, MD;  Location: [facility] ORS;  Service: Urology;  Laterality: Bilateral;   CYSTOSCOPY WITH BIOPSY N/A 11/26/2021   Procedure: CYSTOSCOPY WITH BLADDER BIOPSY;  Surgeon: Vanna Scotland, MD;  Location: [facility] ORS;  Service: Urology;  Laterality: N/A;   FOOT SURGERY Left    HAND SURGERY Right    IR RADIOLOGIST EVAL & MGMT  11/08/2021   IR RADIOLOGIST EVAL & MGMT  02/11/2022   KYPHOPLASTY N/A 07/11/2016   Procedure: LUMBAR 2 KYPHOPLASTY;  Surgeon: Estill Bamberg, MD;  Location: [facility] OR;  Service: Orthopedics;  Laterality: N/A;  LUMBAR 2 KYPHOPLASTY   KYPHOPLASTY  2021   LAMINECTOMY  ~1998   LIVER TRANSPLANT     RADIOLOGY WITH ANESTHESIA N/A 01/09/2022   Procedure: CT MICROWAVE ABLATION;  Surgeon: Oley Balm, MD;  Location: [facility] ORS;  Service: Radiology;  Laterality: N/A;   Family History  Problem Relation Age of Onset   Breast cancer Mother    Alzheimer's disease Mother    Breast cancer Sister    Stroke Father    Colon cancer Neg Hx    Prostate cancer Neg Hx    Social History   Socioeconomic History   Marital status: Married    Spouse name: Not on file   Number of children: Not on file   Years of education: Not on file   Highest education level: Not on file  Occupational History   Not on file  Tobacco Use   Smoking status: Former    Types: Cigarettes   Smokeless tobacco: Never   Vaping Use   Vaping status: Never Used  Substance and Sexual Activity   Alcohol use: Not Currently   Drug use: No   Sexual activity: Yes  Other Topics Concern   Not on file  Social History Narrative   2 kids, local   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, 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   Married 1979   Social Determinants of Corporate investment banker Strain: Low Risk  (02/10/2023)   Overall Financial Resource Strain (CARDIA)    Difficulty of Paying Living Expenses: Not hard at all  Food Insecurity: No Food Insecurity (02/10/2023)   Hunger Vital Sign    Worried About Running Out of Food in the Last Year: Never true    Ran Out of Food in the Last Year: Never true  Transportation Needs:  No Transportation Needs (02/10/2023)   PRAPARE - Administrator, Civil Service (Medical): No    Lack of Transportation (Non-Medical): No  Physical Activity: Insufficiently Active (02/10/2023)   Exercise Vital Sign    Days of Exercise per Week: 3 days    Minutes of Exercise per Session: 30 min  Stress: No Stress Concern Present (02/10/2023)   [employer] of Occupational Health - Occupational Stress Questionnaire    Feeling of Stress : Not at all  Social Connections: Moderately Isolated (02/10/2023)   Social Connection and Isolation Panel [NHANES]    Frequency of Communication with Friends and Family: More than three times a week    Frequency of Social Gatherings with Friends and Family: More than three times a week    Attends Religious Services: Never    Database administrator or Organizations: No    Attends Engineer, structural: Never    Marital Status: Married    Tobacco Counseling Counseling given: Not Answered   Clinical Intake:  Pre-visit preparation completed: Yes  Pain : No/denies pain     Nutritional Risks: None Diabetes: Yes CBG done?: No Did pt. bring in CBG monitor from home?: No  How often do you need to have someone help you when you read instructions,  pamphlets, or other written materials from your doctor or pharmacy?: 1 - Never  Interpreter Needed?: No  Information entered by :: Renie Ora, LPN   Activities of Daily Living    02/10/2023   10:16 AM 02/09/2023    9:49 PM  In your present state of health, do you have any difficulty performing the following activities:  Hearing? 0 0  Vision? 0 0  Difficulty concentrating or making decisions? 0 0  Walking or climbing stairs? 0 0  Dressing or bathing? 0 0  Doing errands, shopping? 0 0  Preparing Food and eating ? N N  Using the Toilet? N N  In the past six months, have you accidently leaked urine? N N  Do you have problems with loss of bowel control? N N  Managing your Medications? N N  Managing your Finances? N N  Housekeeping or managing your Housekeeping? N N    Patient Care Team: Joaquim Nam, MD as PCP - General (Family Medicine) Estill Bamberg, MD as Consulting Physician (Orthopedic Surgery) Blair Promise, OD (Optometry) Alexander, Garry Heater, [place] (Inactive) as Pharmacist (Pharmacist) Benita Gutter, RN as Oncology Nurse Navigator  Indicate any recent Medical Services you may have received from other than [facility] providers in the past year (date may be approximate).     Assessment:   This is a routine wellness examination for Boyds.  Hearing/Vision screen Vision Screening - Comments:: Wears rx glasses - up to date with routine eye exams with  [facility]    Goals Addressed             This Visit's Progress    DIET - EAT MORE FRUITS AND VEGETABLES         Depression Screen    02/10/2023   10:14 AM 02/05/2022    9:31 AM 01/13/2021   11:46 AM 01/13/2021   11:40 AM 03/13/2020    8:00 AM 02/05/2019    8:08 AM 01/09/2016    8:53 AM  PHQ 2/9 Scores  PHQ - 2 Score 0 0 0 0 0 0 0    Fall Risk    02/10/2023   10:09 AM 02/09/2023    9:49 PM  02/05/2022    9:32 AM 11/22/2021   10:49 AM 11/01/2021    3:57 PM  Fall Risk   Falls in the  past year? 0 0 0 0 0  Number falls in past yr: 0  0    Injury with Fall? 0 0 0    Risk for fall due to : No Fall Risks  No Fall Risks    Follow up Falls prevention discussed  Falls prevention discussed      MEDICARE RISK AT HOME: Medicare Risk at Home Any stairs in or around the home?: Yes If so, are there any without handrails?: No Home free of loose throw rugs in walkways, pet beds, electrical cords, etc?: Yes Adequate lighting in your home to reduce risk of falls?: Yes Life alert?: No Use of a cane, walker or w/c?: No Grab bars in the bathroom?: Yes Shower chair or bench in shower?: Yes Elevated toilet seat or a handicapped toilet?: Yes  TIMED UP AND GO:  Was the test performed?  No    Cognitive Function:        02/10/2023   10:16 AM 02/05/2022    9:33 AM 01/13/2021   11:49 AM  6CIT Screen  What Year? 0 points 0 points 0 points  What month? 0 points 0 points 0 points  What time? 0 points 0 points 0 points  Count back from 20 0 points 0 points 0 points  Months in reverse 0 points 0 points 4 points  Repeat phrase 0 points 10 points 4 points  Total Score 0 points 10 points 8 points    Immunizations Immunization History  Administered Date(s) Administered   Fluad Quad(high Dose 65+) 01/15/2022   Hepb-cpg 01/16/2022, 12/10/2022   Influenza,inj,Quad PF,6+ Mos 03/06/2015, 02/21/2017, 01/16/2018, 02/05/2019, 03/13/2020   Influenza-Unspecified 02/03/2014   Moderna Sars-Covid-2 Vaccination 07/21/2019, 08/18/2019, 04/04/2020   Pneumococcal Polysaccharide-23 04/22/2012   Tdap 04/22/2009, 10/07/2014, 09/17/2021    TDAP status: Up to date  Flu Vaccine status: Due, Education has been provided regarding the importance of this vaccine. Advised may receive this vaccine at local pharmacy or Health Dept. Aware to provide a copy of the vaccination record if obtained from local pharmacy or Health Dept. Verbalized acceptance and understanding.  Pneumococcal vaccine status: Due,  Education has been provided regarding the importance of this vaccine. Advised may receive this vaccine at local pharmacy or Health Dept. Aware to provide a copy of the vaccination record if obtained from local pharmacy or Health Dept. Verbalized acceptance and understanding.  Covid-19 vaccine status: Completed vaccines  Qualifies for Shingles Vaccine? Yes   Zostavax completed No   Shingrix Completed?: No.    Education has been provided regarding the importance of this vaccine. Patient has been advised to call insurance company to determine out of pocket expense if they have not yet received this vaccine. Advised may also receive vaccine at local pharmacy or Health Dept. Verbalized acceptance and understanding.  Screening Tests Health Maintenance  Topic Date Due   Diabetic kidney evaluation - Urine ACR  Never done   Zoster Vaccines- Shingrix (1 of 2) Never done   Pneumonia Vaccine 27+ Years old (2 of 2 - PCV) 04/22/2013   HEMOGLOBIN A1C  05/01/2022   FOOT EXAM  08/07/2022   INFLUENZA VACCINE  11/21/2022   COVID-19 Vaccine (4 - 2023-24 season) 12/22/2022   Diabetic kidney evaluation - eGFR measurement  01/19/2023   OPHTHALMOLOGY EXAM  04/26/2023   Medicare Annual Wellness (AWV)  02/10/2024  Colonoscopy  11/09/2024   DTaP/Tdap/Td (4 - Td or Tdap) 09/18/2031   Hepatitis C Screening  Completed   HPV VACCINES  Aged Out    Health Maintenance  Health Maintenance Due  Topic Date Due   Diabetic kidney evaluation - Urine ACR  Never done   Zoster Vaccines- Shingrix (1 of 2) Never done   Pneumonia Vaccine 50+ Years old (2 of 2 - PCV) 04/22/2013   HEMOGLOBIN A1C  05/01/2022   FOOT EXAM  08/07/2022   INFLUENZA VACCINE  11/21/2022   COVID-19 Vaccine (4 - 2023-24 season) 12/22/2022   Diabetic kidney evaluation - eGFR measurement  01/19/2023    Colorectal cancer screening: Type of screening: Colonoscopy. Completed 10/11/2019. Repeat every 5 years  Lung Cancer Screening: (Low Dose CT Chest  recommended if Age 74-80 years, 20 pack-year currently smoking OR have quit w/in 15years.) does not qualify.   Lung Cancer Screening Referral: n/a  Additional Screening:  Hepatitis C Screening: does not qualify; Completed 03/06/2015  Vision Screening: Recommended annual ophthalmology exams for early detection of glaucoma and other disorders of the eye. Is the patient up to date with their annual eye exam?  Yes  Who is the provider or what is the name of the office in which the patient attends annual eye exams? [facility]  If pt is not established with a provider, would they like to be referred to a provider to establish care? No .   Dental Screening: Recommended annual dental exams for proper oral hygiene   Community Resource Referral / Chronic Care Management: CRR required this visit?  No   CCM required this visit?  No     Plan:     I have personally reviewed and noted the following in the patient's chart:   Medical and social history Use of alcohol, tobacco or illicit drugs  Current medications and supplements including opioid prescriptions. Patient is not currently taking opioid prescriptions. Functional ability and status Nutritional status Physical activity Advanced directives List of other physicians Hospitalizations, surgeries, and ER visits in previous 12 months Vitals Screenings to include cognitive, depression, and falls Referrals and appointments  In addition, I have reviewed and discussed with patient certain preventive protocols, quality metrics, and best practice recommendations. A written personalized care plan for preventive services as well as general preventive health recommendations were provided to patient.     Lorrene Reid, LPN   16/01/[id number]   After Visit Summary: (MyChart) Due to this being a telephonic visit, the after visit summary with patients personalized plan was offered to patient via MyChart   Nurse Notes: none

## 2023-02-11 ENCOUNTER — Other Ambulatory Visit: Payer: Medicare Other

## 2023-02-11 DIAGNOSIS — E119 Type 2 diabetes mellitus without complications: Secondary | ICD-10-CM | POA: Diagnosis not present

## 2023-02-11 LAB — COMPREHENSIVE METABOLIC PANEL
ALT: 13 U/L (ref 0–53)
AST: 14 U/L (ref 0–37)
Albumin: 4 g/dL (ref 3.5–5.2)
Alkaline Phosphatase: 87 U/L (ref 39–117)
BUN: 32 mg/dL — ABNORMAL HIGH (ref 6–23)
CO2: 29 meq/L (ref 19–32)
Calcium: 9.4 mg/dL (ref 8.4–10.5)
Chloride: 105 meq/L (ref 96–112)
Creatinine, Ser: 1.49 mg/dL (ref 0.40–1.50)
GFR: 48.53 mL/min — ABNORMAL LOW (ref >60.00)
Glucose, Bld: 145 mg/dL — ABNORMAL HIGH (ref 70–99)
Potassium: 5 meq/L (ref 3.5–5.1)
Sodium: 140 meq/L (ref 135–145)
Total Bilirubin: 1 mg/dL (ref 0.2–1.2)
Total Protein: 6 g/dL (ref 6.0–8.3)

## 2023-02-11 LAB — LIPID PANEL
Cholesterol: 227 mg/dL — ABNORMAL HIGH (ref 0–200)
HDL: 27.9 mg/dL — ABNORMAL LOW (ref >39.00)
LDL Cholesterol: 127 mg/dL — ABNORMAL HIGH (ref 0–99)
NonHDL: 199.12
Total CHOL/HDL Ratio: 8
Triglycerides: 361 mg/dL — ABNORMAL HIGH (ref 0.0–149.0)
VLDL: 72.2 mg/dL — ABNORMAL HIGH (ref 0.0–40.0)

## 2023-02-11 LAB — CBC WITH DIFFERENTIAL/PLATELET
Basophils Absolute: 0 10*3/uL (ref 0.0–0.1)
Basophils Relative: 0.9 % (ref 0.0–3.0)
Eosinophils Absolute: 0.1 10*3/uL (ref 0.0–0.7)
Eosinophils Relative: 3.1 % (ref 0.0–5.0)
HCT: 42.2 % (ref 39.0–52.0)
Hemoglobin: 13.5 g/dL (ref 13.0–17.0)
Lymphocytes Relative: 18.2 % (ref 12.0–46.0)
Lymphs Abs: 0.7 10*3/uL (ref 0.7–4.0)
MCHC: 32.1 g/dL (ref 30.0–36.0)
MCV: 86.3 fL (ref 78.0–100.0)
Monocytes Absolute: 0.5 10*3/uL (ref 0.1–1.0)
Monocytes Relative: 11.6 % (ref 3.0–12.0)
Neutro Abs: 2.7 10*3/uL (ref 1.4–7.7)
Neutrophils Relative %: 66.2 % (ref 43.0–77.0)
Platelets: 214 10*3/uL (ref 150.0–400.0)
RBC: 4.9 Mil/uL (ref 4.22–5.81)
RDW: 14.3 % (ref 11.5–15.5)
WBC: 4 10*3/uL (ref 4.0–10.5)

## 2023-02-11 LAB — MICROALBUMIN / CREATININE URINE RATIO
Creatinine,U: 160 mg/dL
Microalb Creat Ratio: 0.4 mg/g (ref 0.0–30.0)
Microalb, Ur: <0.7 mg/dL (ref 0.0–1.9)

## 2023-02-11 LAB — HEMOGLOBIN A1C: Hgb A1c MFr Bld: 6.1 % (ref 4.6–6.5)

## 2023-02-11 LAB — TSH: TSH: 1.56 u[IU]/mL (ref 0.35–5.50)

## 2023-02-12 DIAGNOSIS — D849 Immunodeficiency, unspecified: Secondary | ICD-10-CM | POA: Diagnosis not present

## 2023-02-12 DIAGNOSIS — Z944 Liver transplant status: Secondary | ICD-10-CM | POA: Diagnosis not present

## 2023-02-13 NOTE — Telephone Encounter (Signed)
Called patient & left voice mail.

## 2023-02-17 NOTE — Telephone Encounter (Signed)
Spoke with wife she states she received letter from insurance that  would not be receiving supplies from [facility]. She will reach out to insurance and see where he will be getting so we can make sure order is sent. We will address once we receive call back from her.

## 2023-02-18 ENCOUNTER — Ambulatory Visit (INDEPENDENT_AMBULATORY_CARE_PROVIDER_SITE_OTHER): Payer: Medicare Other | Admitting: Family Medicine

## 2023-02-18 VITALS — BP 132/74 | HR 95 | Temp 98.0°F | Ht 71.0 in | Wt 234.6 lb

## 2023-02-18 DIAGNOSIS — R300 Dysuria: Secondary | ICD-10-CM | POA: Diagnosis not present

## 2023-02-18 LAB — URINALYSIS, ROUTINE W REFLEX MICROSCOPIC
Nitrite: NEGATIVE
Specific Gravity, Urine: >=1.03 — AB (ref 1.000–1.030)
Total Protein, Urine: 100 — AB
Urine Glucose: NEGATIVE
Urobilinogen, UA: 0.2 (ref 0.0–1.0)
pH: 5.5 (ref 5.0–8.0)

## 2023-02-18 MED ORDER — SULFAMETHOXAZOLE-TRIMETHOPRIM 800-160 MG PO TABS: 1.0000 | ORAL_TABLET | Freq: Two times a day (BID) | ORAL | 0 refills | Status: DC

## 2023-02-18 MED ORDER — TAMSULOSIN HCL 0.4 MG PO CAPS: 0.4000 mg | ORAL_CAPSULE | Freq: Every day | ORAL | 3 refills | Status: DC

## 2023-02-18 NOTE — Progress Notes (Unsigned)
Progressive LUTS.  Pressure to urinate.  Minimal UOP per void today.  Normal UOP yesterday.  No fevers.  Temp 99.1 this AM.   Discussed recent labs and previous transplant at outside hospital.  He is not having abdominal pain.  Discussed options for catheterization in clinic. Discussed current medications including his immunosuppressive's.  Meds, vitals, and allergies reviewed.   ROS: Per HPI unless specifically indicated in ROS section   Nad Ncat Neck supple, no LA Rrr Cta Abd soft, not ttp He has altered sensation in the upper abdomen as expected with healed surgical scar.  He has sensation intact in the lower abdominal wall.  No rebound. Skin well-perfused.  No jaundice. He is able to void a small amount at the clinic.  33 minutes were devoted to patient care in this encounter (this includes time spent reviewing the patient's file/history, interviewing and examining the patient, counseling/reviewing plan with patient).

## 2023-02-18 NOTE — Patient Instructions (Signed)
Start flomax and septra today.  Drink more water.  If you can't urinate, then go to the ER.  Take care.  Glad to see you.

## 2023-02-19 DIAGNOSIS — R300 Dysuria: Secondary | ICD-10-CM | POA: Insufficient documentation

## 2023-02-19 NOTE — Assessment & Plan Note (Signed)
Discussed options.  He still able to void some.  He does not have an obviously protuberant abdomen or tenderness over the bladder.  It is more likely that he is not producing as much urine because he has not had much to drink recently.  Discussed increasing his fluid intake. Start flomax and septra today.  Discussed that if he cannot urinate, then go to the ER.  We opted not to place a Foley with a leg bag at the office visit.  He agreed to plan.  See notes on urinalysis and urine culture.

## 2023-02-20 LAB — URINE CULTURE
MICRO NUMBER:: 15657825
SPECIMEN QUALITY:: ADEQUATE

## 2023-02-25 DIAGNOSIS — Z87891 Personal history of nicotine dependence: Secondary | ICD-10-CM | POA: Diagnosis not present

## 2023-02-25 DIAGNOSIS — Z862 Personal history of diseases of the blood and blood-forming organs and certain disorders involving the immune mechanism: Secondary | ICD-10-CM | POA: Diagnosis not present

## 2023-02-25 DIAGNOSIS — Z86718 Personal history of other venous thrombosis and embolism: Secondary | ICD-10-CM | POA: Diagnosis not present

## 2023-02-25 DIAGNOSIS — Z7901 Long term (current) use of anticoagulants: Secondary | ICD-10-CM | POA: Diagnosis not present

## 2023-02-25 DIAGNOSIS — Z8639 Personal history of other endocrine, nutritional and metabolic disease: Secondary | ICD-10-CM | POA: Diagnosis not present

## 2023-02-25 DIAGNOSIS — Z86711 Personal history of pulmonary embolism: Secondary | ICD-10-CM | POA: Diagnosis not present

## 2023-02-25 DIAGNOSIS — Z09 Encounter for follow-up examination after completed treatment for conditions other than malignant neoplasm: Secondary | ICD-10-CM | POA: Diagnosis not present

## 2023-03-17 DIAGNOSIS — D849 Immunodeficiency, unspecified: Secondary | ICD-10-CM | POA: Diagnosis not present

## 2023-03-17 DIAGNOSIS — Z944 Liver transplant status: Secondary | ICD-10-CM | POA: Diagnosis not present

## 2023-03-18 ENCOUNTER — Telehealth: Payer: Self-pay

## 2023-03-18 NOTE — Telephone Encounter (Signed)
ERROR

## 2023-03-25 ENCOUNTER — Encounter: Payer: Medicare Other | Admitting: Family Medicine

## 2023-03-25 DIAGNOSIS — Z944 Liver transplant status: Secondary | ICD-10-CM | POA: Diagnosis not present

## 2023-03-25 DIAGNOSIS — D849 Immunodeficiency, unspecified: Secondary | ICD-10-CM | POA: Diagnosis not present

## 2023-03-25 DIAGNOSIS — R350 Frequency of micturition: Secondary | ICD-10-CM | POA: Diagnosis not present

## 2023-03-27 ENCOUNTER — Encounter: Payer: Self-pay | Admitting: Emergency Medicine

## 2023-03-27 ENCOUNTER — Emergency Department
Admission: EM | Admit: 2023-03-27 | Discharge: 2023-03-27 | Disposition: A | Discharge status: Home / Self Care | Payer: Medicare Other | Attending: Student in an Organized Health Care Education/Training Program | Admitting: Student in an Organized Health Care Education/Training Program

## 2023-03-27 ENCOUNTER — Emergency Department: Payer: Medicare Other

## 2023-03-27 ENCOUNTER — Other Ambulatory Visit: Payer: Self-pay

## 2023-03-27 DIAGNOSIS — E860 Dehydration: Secondary | ICD-10-CM | POA: Diagnosis not present

## 2023-03-27 DIAGNOSIS — R918 Other nonspecific abnormal finding of lung field: Secondary | ICD-10-CM | POA: Diagnosis not present

## 2023-03-27 DIAGNOSIS — Z944 Liver transplant status: Secondary | ICD-10-CM | POA: Insufficient documentation

## 2023-03-27 DIAGNOSIS — R197 Diarrhea, unspecified: Secondary | ICD-10-CM | POA: Diagnosis not present

## 2023-03-27 DIAGNOSIS — R5381 Other malaise: Secondary | ICD-10-CM | POA: Diagnosis present

## 2023-03-27 DIAGNOSIS — Z8505 Personal history of malignant neoplasm of liver: Secondary | ICD-10-CM | POA: Diagnosis not present

## 2023-03-27 LAB — COMPREHENSIVE METABOLIC PANEL
ALT: 31 U/L (ref 0–44)
AST: 24 U/L (ref 15–41)
Albumin: 2.9 g/dL — ABNORMAL LOW (ref 3.5–5.0)
Alkaline Phosphatase: 139 U/L — ABNORMAL HIGH (ref 38–126)
Anion gap: 11 (ref 5–15)
BUN: 77 mg/dL — ABNORMAL HIGH (ref 8–23)
CO2: 22 mmol/L (ref 22–32)
Calcium: 9.2 mg/dL (ref 8.9–10.3)
Chloride: 102 mmol/L (ref 98–111)
Creatinine, Ser: 2.75 mg/dL — ABNORMAL HIGH (ref 0.61–1.24)
GFR, Estimated: 25 mL/min — ABNORMAL LOW (ref >60)
Glucose, Bld: 234 mg/dL — ABNORMAL HIGH (ref 70–99)
Potassium: 4.1 mmol/L (ref 3.5–5.1)
Sodium: 135 mmol/L (ref 135–145)
Total Bilirubin: 0.5 mg/dL (ref <1.2)
Total Protein: 6.7 g/dL (ref 6.5–8.1)

## 2023-03-27 LAB — URINALYSIS, ROUTINE W REFLEX MICROSCOPIC
Bilirubin Urine: NEGATIVE
Glucose, UA: NEGATIVE mg/dL
Ketones, ur: NEGATIVE mg/dL
Nitrite: NEGATIVE
Protein, ur: 30 mg/dL — AB
Specific Gravity, Urine: 1.018 (ref 1.005–1.030)
WBC, UA: >50 WBC/hpf (ref 0–5)
pH: 5 (ref 5.0–8.0)

## 2023-03-27 LAB — CBC
HCT: 34.3 % — ABNORMAL LOW (ref 39.0–52.0)
Hemoglobin: 11.1 g/dL — ABNORMAL LOW (ref 13.0–17.0)
MCH: 27.7 pg (ref 26.0–34.0)
MCHC: 32.4 g/dL (ref 30.0–36.0)
MCV: 85.5 fL (ref 80.0–100.0)
Platelets: 293 10*3/uL (ref 150–400)
RBC: 4.01 MIL/uL — ABNORMAL LOW (ref 4.22–5.81)
RDW: 13.9 % (ref 11.5–15.5)
WBC: 7.6 10*3/uL (ref 4.0–10.5)
nRBC: 0 % (ref 0.0–0.2)

## 2023-03-27 LAB — GASTROINTESTINAL PANEL BY PCR, STOOL (REPLACES STOOL CULTURE)

## 2023-03-27 LAB — LACTIC ACID, PLASMA: Lactic Acid, Venous: 0.8 mmol/L (ref 0.5–1.9)

## 2023-03-27 LAB — C DIFFICILE QUICK SCREEN W PCR REFLEX
C Diff antigen: NEGATIVE
C Diff interpretation: NOT DETECTED
C Diff toxin: NEGATIVE

## 2023-03-27 LAB — BASIC METABOLIC PANEL
Anion gap: 11 (ref 5–15)
BUN: 75 mg/dL — ABNORMAL HIGH (ref 8–23)
CO2: 21 mmol/L — ABNORMAL LOW (ref 22–32)
Calcium: 8.4 mg/dL — ABNORMAL LOW (ref 8.9–10.3)
Chloride: 105 mmol/L (ref 98–111)
Creatinine, Ser: 2.48 mg/dL — ABNORMAL HIGH (ref 0.61–1.24)
GFR, Estimated: 28 mL/min — ABNORMAL LOW (ref >60)
Glucose, Bld: 178 mg/dL — ABNORMAL HIGH (ref 70–99)
Potassium: 3.5 mmol/L (ref 3.5–5.1)
Sodium: 137 mmol/L (ref 135–145)

## 2023-03-27 LAB — LIPASE, BLOOD: Lipase: 54 U/L — ABNORMAL HIGH (ref 11–51)

## 2023-03-27 MED ORDER — CEPHALEXIN 250 MG PO CAPS: 250.0000 mg | ORAL_CAPSULE | Freq: Two times a day (BID) | ORAL | 0 refills | Status: DC

## 2023-03-27 MED ORDER — SODIUM CHLORIDE 0.9 % IV BOLUS
500.0000 mL | Freq: Once | INTRAVENOUS | Status: AC
  Administered 2023-03-27: 500 mL via INTRAVENOUS

## 2023-03-27 MED ORDER — SODIUM CHLORIDE 0.9 % IV BOLUS
500.0000 mL | Freq: Once | INTRAVENOUS | Status: AC
Start: 2023-03-27 — End: 2023-03-27
  Administered 2023-03-27: 500 mL via INTRAVENOUS

## 2023-03-27 MED ORDER — SODIUM CHLORIDE 0.9 % IV BOLUS: 500.0000 mL | Freq: Once | INTRAVENOUS | Status: DC

## 2023-03-27 MED ORDER — SODIUM CHLORIDE 0.9 % IV SOLN
1.0000 g | Freq: Once | INTRAVENOUS | Status: AC
  Administered 2023-03-27: 1 g via INTRAVENOUS
  Filled 2023-03-27: qty 10

## 2023-03-27 NOTE — ED Notes (Signed)
Pt attempting to use restroom to provide stool sample.

## 2023-03-27 NOTE — ED Triage Notes (Signed)
Pt to ED via POV. Pt is Liver Transplant pt. Pt has had diarrhea for the last several days. Pt has lab work done at [facility] and was told that his Creatinine was triple what it normally is. They also recommend that the pt have a stool sample done since he is still having diarrhea. Pt has not missed any of his anti-rejection medications.

## 2023-03-27 NOTE — ED Provider Notes (Signed)
[facility] Provider Note    Event Date/Time   First MD Initiated Contact with Patient 03/27/23 0830     (approximate)   History   Diarrhea   HPI  BRADELY PESCH is a 67 y.o. male fairly complex past medical history including history of malignancy and patient is a liver transplant recipient presents to the ER for evaluation of generalized malaise as well as several days of watery diarrhea no blood.  Has been compliant with his antirejection medication denies any pain.  Denies any chest discomfort or pressure.  Does feel weak with ambulation.  Was told that his renal function was elevated.     Physical Exam   Triage Vital Signs: ED Triage Vitals  Encounter Vitals Group     BP 03/27/23 0802 112/65     Systolic BP Percentile --      Diastolic BP Percentile --      Pulse Rate 03/27/23 0802 64     Resp 03/27/23 0802 16     Temp 03/27/23 0802 (!) 97.5 F (36.4 C)     Temp Source 03/27/23 0802 Oral     SpO2 03/27/23 0802 100 %     Weight 03/27/23 0803 230 lb (104.3 kg)     Height 03/27/23 0803 5\' 11"  (1.803 m)     Head Circumference --      Peak Flow --      Pain Score 03/27/23 0803 0     Pain Loc --      Pain Education --      Exclude from Growth Chart --     Most recent vital signs: Vitals:   03/27/23 0802 03/27/23 1300  BP: 112/65 129/68  Pulse: 64 98  Resp: 16 16  Temp: (!) 97.5 F (36.4 C) 97.8 F (36.6 C)  SpO2: 100% 100%     Constitutional: Alert  Eyes: Conjunctivae are normal.  Head: Atraumatic. Nose: No congestion/rhinnorhea. Mouth/Throat: Mucous membranes are moist.   Neck: Painless ROM.  Cardiovascular:   Good peripheral circulation. Respiratory: Normal respiratory effort.  No retractions.  Gastrointestinal: Soft and nontender.  Musculoskeletal:  no deformity Neurologic:  MAE spontaneously. No gross focal neurologic deficits are appreciated.  Skin:  Skin is warm, dry and intact. No rash noted. Psychiatric: Mood and  affect are normal. Speech and behavior are normal.    ED Results / Procedures / Treatments   Labs (all labs ordered are listed, but only abnormal results are displayed) Labs Reviewed  LIPASE, BLOOD - Abnormal; Notable for the following components:      Result Value   Lipase 54 (*)    All other components within normal limits  COMPREHENSIVE METABOLIC PANEL - Abnormal; Notable for the following components:   Glucose, Bld 234 (*)    BUN 77 (*)    Creatinine, Ser 2.75 (*)    Albumin 2.9 (*)    Alkaline Phosphatase 139 (*)    GFR, Estimated 25 (*)    All other components within normal limits  CBC - Abnormal; Notable for the following components:   RBC 4.01 (*)    Hemoglobin 11.1 (*)    HCT 34.3 (*)    All other components within normal limits  URINALYSIS, ROUTINE W REFLEX MICROSCOPIC - Abnormal; Notable for the following components:   Color, Urine YELLOW (*)    APPearance CLOUDY (*)    Hgb urine dipstick MODERATE (*)    Protein, ur 30 (*)    Leukocytes,Ua LARGE (*)  Bacteria, UA MANY (*)    All other components within normal limits  BASIC METABOLIC PANEL - Abnormal; Notable for the following components:   CO2 21 (*)    Glucose, Bld 178 (*)    BUN 75 (*)    Creatinine, Ser 2.48 (*)    Calcium 8.4 (*)    GFR, Estimated 28 (*)    All other components within normal limits  GASTROINTESTINAL PANEL BY PCR, STOOL (REPLACES STOOL CULTURE)  C DIFFICILE QUICK SCREEN W PCR REFLEX    LACTIC ACID, PLASMA  TACROLIMUS LEVEL  LACTIC ACID, PLASMA       RADIOLOGY Please see ED Course for my review and interpretation.  I personally reviewed all radiographic images ordered to evaluate for the above acute complaints and reviewed radiology reports and findings.  These findings were personally discussed with the patient.  Please see medical record for radiology report.    PROCEDURES:  Critical Care performed: No  Procedures   MEDICATIONS ORDERED IN ED: Medications  sodium  chloride 0.9 % bolus 500 mL (has no administration in time range)  sodium chloride 0.9 % bolus 500 mL (0 mLs Intravenous Stopped 03/27/23 1043)  cefTRIAXone (ROCEPHIN) 1 g in sodium chloride 0.9 % 100 mL IVPB (0 g Intravenous Stopped 03/27/23 1037)  sodium chloride 0.9 % bolus 500 mL (0 mLs Intravenous Stopped 03/27/23 1323)  sodium chloride 0.9 % bolus 500 mL (500 mLs Intravenous Bolus 03/27/23 1323)     IMPRESSION / MDM / ASSESSMENT AND PLAN / ED COURSE  I reviewed the triage vital signs and the nursing notes.                              Differential diagnosis includes, but is not limited to, dehydration, electrolyte abnormalities, rejection, UTI, C. difficile, colitis, foodborne illness  Patient presenting to the ER for evaluation of symptoms as described above.  Based on symptoms, risk factors and considered above differential, this presenting complaint could reflect a potentially life-threatening illness therefore the patient will be placed on continuous pulse oximetry and telemetry for monitoring.  Laboratory evaluation will be sent to evaluate for the above complaints.      Clinical Course as of 03/27/23 1413  Thu Mar 27, 2023  0915 Patient does have many bacteria with many white cells and urinalysis.  Will give Rocephin as he is immunocompromise. [PR]  1131 Patient feels well and not having much significant diarrhea at this point but was able to provide a sample.  I have consulted the liver transplant team at [facility] for further recommendations. [PR]  1222 C. difficile is fortunately negative.  Still not heard back from transplant service. [PR]  1306 GI panel is also negative.  Have repaged transplant team. [PR]  1329 Patient feels significantly improved.  Discussed case in consultation with transplant team who recommended observation and additional IV fluids.  Discussed recommendation with patient and family.  He feels very well has not had any additional diarrhea since being in the  hospital and is reluctant to be readmitted at this point.  As he is not septic and otherwise his renal function does appear to be improving think additional IV fluids and repeat metabolic panel was a reasonable option as he is tolerating p.o. [PR]  1408 Patient reassessed.  He is tolerating p.o. feels well requesting discharge home.  Repeat BMP continues to improve showing reassuring trajectory.  Again discussed option for admission to the  hospital for IV fluids but patient and wife feel comfortable with outpatient follow-up and they are reliable and agree to push fluids.  Discussed signs and symptoms for which she should return to the ER. [PR]    Clinical Course User Index [PR] Willy Eddy, MD     FINAL CLINICAL IMPRESSION(S) / ED DIAGNOSES   Final diagnoses:  Dehydration     Rx / DC Orders   ED Discharge Orders          Ordered    cephALEXin (KEFLEX) 250 MG capsule  2 times daily        03/27/23 1411             Note:  This document was prepared using Dragon voice recognition software and may include unintentional dictation errors.    Willy Eddy, MD 03/27/23 [phone]

## 2023-03-27 NOTE — ED Notes (Signed)
Pt presents to ED with c/o of watery diarrhea for the past few days, pt states recent kidney infection where he was treated with ABX and states is no longer taking them. Pt states liver transplant at [facility] 1 year ago. Pt denies any ABD pain but also states that area is "numb'. Pt does endorses possible cold sweats the other day after hunting. Pt denies any urinary s/s at this time. Pt is A&Ox4. VSS.

## 2023-03-28 ENCOUNTER — Telehealth: Payer: Self-pay

## 2023-03-28 NOTE — Transitions of Care (Post Inpatient/ED Visit) (Signed)
   03/28/2023  Name: Tim Hodge MRN: [id number] DOB: [birth year]  Today's TOC FU Call Status: Today's TOC FU Call Status:: Successful TOC FU Call Completed TOC FU Call Complete Date: 03/28/23 Patient's Name and Date of Birth confirmed.  Transition Care Management Follow-up Telephone Call Date of Discharge: 03/27/23 Discharge Facility: [facility]) Type of Discharge: Emergency Department Reason for ED Visit: Other: (dehydration) How have you been since you were released from the hospital?: Better Any questions or concerns?: No  Items Reviewed: Did you receive and understand the discharge instructions provided?: Yes Medications obtained,verified, and reconciled?: Yes (Medications Reviewed) Any new allergies since your discharge?: No Dietary orders reviewed?: Yes Do you have support at home?: Yes People in Home: spouse  Medications Reviewed Today: Medications Reviewed Today     Reviewed by Karena Addison, LPN (Licensed Practical Nurse) on 03/28/23 at 1113  Med List Status: <None>   Medication Order Taking? Sig Documenting Provider Last Dose Status Informant  cephALEXin (KEFLEX) 250 MG capsule [id number]  Take 1 capsule (250 mg total) by mouth 2 (two) times daily for 5 days. Willy Eddy, MD  Active   Continuous Blood Gluc Receiver [facility] G7 RECEIVER) [place] [id number] No by Does not apply route. [provider] Taking Active Spouse/Significant Other, Self  Continuous Blood Gluc Sensor (DEXCOM G7 SENSOR) [place] [id number] No Apply sensor every 10 days [provider] Taking Active Spouse/Significant Other, Self  ELIQUIS 5 MG TABS tablet [id number] No Take 5 mg by mouth 2 (two) times daily. [provider] Taking Active   ferrous sulfate 325 (65 FE) MG tablet [id number] No Take by mouth. [provider] Taking Active   magnesium oxide (MAG-OX) 400 MG tablet [id number] No Take by mouth. [provider] Taking Active    mycophenolate (CELLCEPT) 250 MG capsule [id number] No Take by mouth. [provider] Taking Active   sulfamethoxazole-trimethoprim (BACTRIM DS) 800-160 MG tablet [id number]  Take 1 tablet by mouth 2 (two) times daily. Joaquim Nam, MD  Active   tacrolimus (PROGRAF) 1 MG capsule [id number] No Take by mouth. [provider] Taking Active   tamsulosin (FLOMAX) 0.4 MG CAPS capsule [id number]  Take 1 capsule (0.4 mg total) by mouth daily. Joaquim Nam, MD  Active             Home Care and Equipment/Supplies: Were Home Health Services Ordered?: NA Any new equipment or medical supplies ordered?: NA  Functional Questionnaire: Do you need assistance with bathing/showering or dressing?: No Do you need assistance with meal preparation?: No Do you need assistance with eating?: No Do you have difficulty maintaining continence: No Do you need assistance with getting out of bed/getting out of a chair/moving?: No Do you have difficulty managing or taking your medications?: No  Follow up appointments reviewed: PCP Follow-up appointment confirmed?: Yes Date of PCP follow-up appointment?: 04/03/23 Follow-up Provider: [facility] Follow-up appointment confirmed?: NA Do you need transportation to your follow-up appointment?: No Do you understand care options if your condition(s) worsen?: Yes-patient verbalized understanding    SIGNATURE Karena Addison, LPN [facility] Nurse Health Advisor Direct Dial [phone]

## 2023-03-29 LAB — TACROLIMUS LEVEL: Tacrolimus (FK506) - LabCorp: 6.3 ng/mL (ref 2.0–20.0)

## 2023-04-03 ENCOUNTER — Encounter: Payer: Self-pay | Admitting: Family Medicine

## 2023-04-03 ENCOUNTER — Encounter: Payer: Medicare Other | Admitting: Family Medicine

## 2023-04-03 ENCOUNTER — Ambulatory Visit (INDEPENDENT_AMBULATORY_CARE_PROVIDER_SITE_OTHER): Payer: Medicare Other | Admitting: Family Medicine

## 2023-04-03 VITALS — BP 118/64 | HR 64 | Temp 97.9°F | Ht 71.0 in | Wt 236.2 lb

## 2023-04-03 DIAGNOSIS — R300 Dysuria: Secondary | ICD-10-CM

## 2023-04-03 DIAGNOSIS — Z944 Liver transplant status: Secondary | ICD-10-CM | POA: Diagnosis not present

## 2023-04-03 DIAGNOSIS — E119 Type 2 diabetes mellitus without complications: Secondary | ICD-10-CM | POA: Diagnosis not present

## 2023-04-03 DIAGNOSIS — D849 Immunodeficiency, unspecified: Secondary | ICD-10-CM | POA: Diagnosis not present

## 2023-04-03 DIAGNOSIS — R197 Diarrhea, unspecified: Secondary | ICD-10-CM | POA: Diagnosis not present

## 2023-04-03 NOTE — Progress Notes (Signed)
ER f/u.  Had diarrhea, to ER, given fluids and started on abx in the meantime.  Prev labs d/w pt. He is going to have labs done today at [facility] through [place].  No FCNAV.  Minimal diarrhea now, better than prior.  He has dec sensation abd wall at baseline.    Diabetes:  Off DM2 meds.   Hypoglycemic episodes: no Hyperglycemic episodes: yes, see below.   Feet problems: some numbness in L foot at baseline, predates DM2.   Blood Sugars averaging: 160s this AM, prev up to 290 during the recent illness.   eye exam within last year: yes Most recent A1c 6.1.   Still using dexcom.  He had sig sugar elevation/variation with prev illness.  Using dexcom clearly helps patient with monitoring to help him adjust his diet.    D/w pt about getting flu and PNA shot when well.   He has f/u pending with [facility], d/w pt.    Dysuria from prior episode resolved with septra, d/w pt.  Urinating well now.  Off flomax.    Meds, vitals, and allergies reviewed.  ROS: Per HPI unless specifically indicated in ROS section   GEN: nad, alert and oriented HEENT: ncat NECK: supple w/o LA CV: rrr. PULM: ctab, no inc wob ABD: soft, +bs EXT: trace BLE edema SKIN: no acute rash  Diabetic foot exam: Normal inspection except for chronic post surgical changes on the L foot.   No skin breakdown Callus w/o ulceration on the ball of the L foot.   Normal DP pulses Normal sensation to light touch and monofilament Nails normal

## 2023-04-03 NOTE — Patient Instructions (Signed)
I'll await your labs from [facility].  Take care.  Glad to see you.

## 2023-04-06 DIAGNOSIS — R197 Diarrhea, unspecified: Secondary | ICD-10-CM | POA: Insufficient documentation

## 2023-04-06 NOTE — Assessment & Plan Note (Signed)
Most recent A1c 6.1.   Still using dexcom.  He had sig sugar elevation/variation with prev illness.  Using dexcom clearly helps patient with monitoring to help him adjust his diet.   Continue as is with dexcom, diet and exercise.

## 2023-04-06 NOTE — Assessment & Plan Note (Signed)
Minimal symptoms now.  Update me as needed.  Okay for outpatient follow-up.

## 2023-04-06 NOTE — Assessment & Plan Note (Signed)
 Resolved, off flomax

## 2023-04-08 ENCOUNTER — Encounter: Payer: Medicare Other | Admitting: Family Medicine

## 2023-04-09 DIAGNOSIS — E875 Hyperkalemia: Secondary | ICD-10-CM | POA: Diagnosis not present

## 2023-04-09 DIAGNOSIS — K7460 Unspecified cirrhosis of liver: Secondary | ICD-10-CM | POA: Diagnosis not present

## 2023-04-09 DIAGNOSIS — K862 Cyst of pancreas: Secondary | ICD-10-CM | POA: Diagnosis not present

## 2023-04-09 DIAGNOSIS — Z944 Liver transplant status: Secondary | ICD-10-CM | POA: Diagnosis not present

## 2023-04-09 DIAGNOSIS — C220 Liver cell carcinoma: Secondary | ICD-10-CM | POA: Diagnosis not present

## 2023-04-21 ENCOUNTER — Encounter: Payer: Medicare Other | Admitting: Family Medicine

## 2023-04-21 DIAGNOSIS — E1165 Type 2 diabetes mellitus with hyperglycemia: Secondary | ICD-10-CM | POA: Diagnosis not present

## 2023-04-22 ENCOUNTER — Ambulatory Visit: Payer: Self-pay | Admitting: Family Medicine

## 2023-04-22 DIAGNOSIS — D849 Immunodeficiency, unspecified: Secondary | ICD-10-CM | POA: Diagnosis not present

## 2023-04-22 DIAGNOSIS — Z944 Liver transplant status: Secondary | ICD-10-CM | POA: Diagnosis not present

## 2023-04-22 NOTE — Telephone Encounter (Signed)
 Please call pt.  See if he can get checked at [facility] in the meantime.  I am not clinic currently to see patient today.  I would prefer him to be seen today if at all possible at [facility] if not at [facility].  Thanks.

## 2023-04-22 NOTE — Telephone Encounter (Signed)
 Copied from CRM [id number]. Topic: Clinical - Pink Word Triage >> Apr 22, 2023  9:07 AM Isabell A wrote: Reason for Triage: UTI symptoms - burning while urinating, little frequency while urinating.   Chief Complaint: Painful urination  Symptoms: Burning with urination  Frequency: Frequent  Pertinent Negatives: Patient denies fever, abdominal pain, flank pain  Disposition: [] ED /[] Urgent Care (no appt availability in office) / [x] Appointment(In office/virtual)/ []  [place] [facility]/ [] Home Care/ [] Refused Recommended Disposition /[] [place] [facility]/ []  Follow-up with PCP Additional Notes: Patient reports he began to experiencing painful urination this morning at 2-3 am. Patient states that the dysuria was improving but that the last time he urinated his pain was worse. He reports a history of UTI's and states his current symptoms are similar. He denies any fever, abdominal pain, or flank pain. Appointment made for 04/24/23 in the clinic for evaluation and treatment of symptoms. Patient advised to call back or follow up with Urgent Care if symptoms worsen or if he is unable to urinate.     Reason for Disposition  All other males with painful urination  Answer Assessment - Initial Assessment Questions 1. SEVERITY: How bad is the pain?  (e.g., Scale 1-10; mild, moderate, or severe)   - MILD (1-3): Complains slightly about urination hurting.   - MODERATE (4-7): Interferes with normal activities.     - SEVERE (8-10): Excruciating, unwilling or unable to urinate because of the pain.      5/10 2. FREQUENCY: How many times have you had painful urination today?      5-6 times 3. PATTERN: Is pain present every time you urinate or just sometimes?      Had a couple of urinations without pain, but now pain returned  4. ONSET: When did the painful urination start?      This morning 2-3 am 5. FEVER: Do you have a fever? If Yes, ask: What is your temperature, how was it measured, and  when did it start?     No 6. PAST UTI: Have you had a urine infection before? If Yes, ask: When was the last time? and What happened that time?      Yes, treated with antibiotic  7. CAUSE: What do you think is causing the painful urination?      Unsure  8. OTHER SYMPTOMS: Do you have any other symptoms? (e.g., flank pain, penis discharge, scrotal pain, blood in urine)     No  Protocols used: Urination Pain - Male-A-AH

## 2023-04-22 NOTE — Telephone Encounter (Signed)
 Thanks

## 2023-04-22 NOTE — Telephone Encounter (Signed)
Called and advised patient and patients wife of Dr. Lianne Bushy message and recommendations. They both verbalized understanding. Advised to call back with any questions or concerns.

## 2023-04-24 ENCOUNTER — Encounter: Payer: Self-pay | Admitting: Family Medicine

## 2023-04-24 ENCOUNTER — Ambulatory Visit: Payer: Medicare PPO | Admitting: Family Medicine

## 2023-04-24 VITALS — BP 128/60 | HR 71 | Temp 98.2°F | Ht 71.0 in | Wt 230.6 lb

## 2023-04-24 DIAGNOSIS — R300 Dysuria: Secondary | ICD-10-CM

## 2023-04-24 LAB — POC URINALSYSI DIPSTICK (AUTOMATED)
Bilirubin, UA: NEGATIVE
Glucose, UA: NEGATIVE
Ketones, UA: NEGATIVE
Nitrite, UA: NEGATIVE
Protein, UA: POSITIVE — AB
Spec Grav, UA: 1.02 (ref 1.010–1.025)
Urobilinogen, UA: NEGATIVE U/dL — AB
pH, UA: 6 (ref 5.0–8.0)

## 2023-04-24 MED ORDER — CEPHALEXIN 500 MG PO CAPS: 500.0000 mg | ORAL_CAPSULE | Freq: Four times a day (QID) | ORAL | 0 refills | Status: DC

## 2023-04-24 NOTE — Progress Notes (Signed)
 He had improving Cr per outside clinic.  Discussed with patient.  Prev UTI resolved and had interval w/o sx.    Sx recently noted.  Sx started 3 days ago.  Then got better in the meantime, until some sx in the early AMs. Better as the day goes on.  No fevers.  No vomiting.  Still with good stream currently.  He had some lower back discomfort yesterday.    Meds, vitals, and allergies reviewed.   ROS: Per HPI unless specifically indicated in ROS section   Nad Ncat Neck supple, no LA Ctab RRR Abd with altered sensation but not ttp in lower abd where he has sensation.  No CVA pain.  Skin well-perfused.  No jaundice.

## 2023-04-24 NOTE — Patient Instructions (Addendum)
 Drink plenty of water and start the antibiotics today.  We'll contact you with your lab report.  Take care.   Flu shot when feeling better.  Take care.  Glad to see you.

## 2023-04-26 LAB — URINE CULTURE
MICRO NUMBER:: 15910165
SPECIMEN QUALITY:: ADEQUATE

## 2023-04-27 NOTE — Assessment & Plan Note (Signed)
 Urinalysis discussed with patient.  Concern for UTI.  Okay for outpatient follow-up.  Check urine culture.  Start Keflex.  See notes on labs.

## 2023-05-14 ENCOUNTER — Telehealth: Payer: Self-pay

## 2023-05-14 NOTE — Telephone Encounter (Signed)
Received paperwork from [facility] for CGM supplies. Reached out to patient and spoke with spouse Stanton Kidney who is on the [place]. She advised that the insurance changed and they were told it would be on [employer] but it will be changing again in February. Therefore she wants to hold off sending information to [employer] until Feb. She will reach back out to me at that time if it will still be with [employer]. Patient has enough supplies to last him at least 50 days.

## 2023-05-15 ENCOUNTER — Encounter: Payer: Medicare Other | Admitting: Family Medicine

## 2023-05-26 ENCOUNTER — Encounter: Payer: Self-pay | Admitting: Family Medicine

## 2023-05-26 ENCOUNTER — Ambulatory Visit (INDEPENDENT_AMBULATORY_CARE_PROVIDER_SITE_OTHER): Payer: Medicare Other | Admitting: Family Medicine

## 2023-05-26 VITALS — BP 136/82 | HR 64 | Temp 98.1°F | Ht 71.5 in | Wt 235.2 lb

## 2023-05-26 DIAGNOSIS — R739 Hyperglycemia, unspecified: Secondary | ICD-10-CM | POA: Diagnosis not present

## 2023-05-26 DIAGNOSIS — Z7189 Other specified counseling: Secondary | ICD-10-CM

## 2023-05-26 DIAGNOSIS — E119 Type 2 diabetes mellitus without complications: Secondary | ICD-10-CM | POA: Diagnosis not present

## 2023-05-26 DIAGNOSIS — Z86711 Personal history of pulmonary embolism: Secondary | ICD-10-CM

## 2023-05-26 DIAGNOSIS — Z0000 Encounter for general adult medical examination without abnormal findings: Secondary | ICD-10-CM

## 2023-05-26 DIAGNOSIS — E785 Hyperlipidemia, unspecified: Secondary | ICD-10-CM

## 2023-05-26 DIAGNOSIS — Z23 Encounter for immunization: Secondary | ICD-10-CM

## 2023-05-26 DIAGNOSIS — Z944 Liver transplant status: Secondary | ICD-10-CM

## 2023-05-26 NOTE — Progress Notes (Unsigned)
He is still using CGM.  He was able to get supplies in the meantime.  Having CGM helps him monitor his sugar.  Sugar was higher prev with UTI (200s) but improved in the meantime.  100-150 usually.  Having the monitor helps him with meal planning and helps him limit hypoglycemia.  Lowest reading 77.    H/o DM2.   No meds.  Hypoglycemic episodes: see above.   Hyperglycemic episodes: see above.   Feet problems: some tingling at rest.   Blood Sugars averaging: see above.   eye exam within last year: due, d/w pt.  He is going to schedule.    Liver transplant.  Still on program and cellcept per outside clinic. No abd pain, no jaundice.    No dysuria and done with keflex.   Anticoagulated.  H/o PE.  Plan for longterm anticoagulation.  No bleeding.  Compliant.    Flu shot today.   Tetanus 2016 PNA vaccine d/w pt.   Shingles d/w pt.   He had his covid vaccine prev.  Colonoscopy pending 2021.   Prostate cancer screening and PSA options (with potential risks and benefits of testing vs not testing) were discussed along with recent recs/guidelines.  He declined testing PSA at this point. Living will d/w pt.  Wife designated if patient were incapacitated.    Meds, vitals, and allergies reviewed.   ROS: Per HPI unless specifically indicated in ROS section   Nad Ncat Neck supple, no LA Rrr Ctab Abd soft No juandice.  Trace BLE, R>L .

## 2023-05-26 NOTE — Patient Instructions (Addendum)
Flu shot today.  Take care.  Glad to see you. [facility]  Update me as needed.

## 2023-05-28 DIAGNOSIS — Z0000 Encounter for general adult medical examination without abnormal findings: Secondary | ICD-10-CM | POA: Insufficient documentation

## 2023-05-28 NOTE — Assessment & Plan Note (Signed)
 Liver transplant.  Still on prograf  and cellcept per outside clinic. No abd pain, no jaundice.  Compliant with meds.

## 2023-05-28 NOTE — Assessment & Plan Note (Signed)
 Flu shot today.   Tetanus 2016 PNA vaccine d/w pt.   Shingles d/w pt.   He had his covid vaccine prev.  Colonoscopy pending 2021.   Prostate cancer screening and PSA options (with potential risks and benefits of testing vs not testing) were discussed along with recent recs/guidelines.  He declined testing PSA at this point. Living will d/w pt.  Wife designated if patient were incapacitated.

## 2023-05-28 NOTE — Assessment & Plan Note (Signed)
 Living will d/w pt.  Wife designated if patient were incapacitated.   ?

## 2023-05-28 NOTE — Assessment & Plan Note (Signed)
 H/o, off meds currently.  He is still using CGM.  He was able to get supplies in the meantime.  Having CGM helps him monitor his sugar.  Sugar was higher prev with UTI (200s) but improved in the meantime.  100-150 usually.  Having the monitor helps him with meal planning and helps him limit hypoglycemia.  Lowest reading 77.  Would continue use of CGM.  He'll get labs done out of clinic and we can see about options/follow up at that point  he agrees with plan.

## 2023-05-28 NOTE — Assessment & Plan Note (Signed)
 H/o PE.  Plan for longterm anticoagulation.  No bleeding.  Compliant.

## 2023-06-03 DIAGNOSIS — K862 Cyst of pancreas: Secondary | ICD-10-CM | POA: Diagnosis not present

## 2023-06-03 DIAGNOSIS — I129 Hypertensive chronic kidney disease with stage 1 through stage 4 chronic kidney disease, or unspecified chronic kidney disease: Secondary | ICD-10-CM | POA: Diagnosis not present

## 2023-06-03 DIAGNOSIS — C220 Liver cell carcinoma: Secondary | ICD-10-CM | POA: Diagnosis not present

## 2023-06-03 DIAGNOSIS — Z79624 Long term (current) use of inhibitors of nucleotide synthesis: Secondary | ICD-10-CM | POA: Diagnosis not present

## 2023-06-03 DIAGNOSIS — Z944 Liver transplant status: Secondary | ICD-10-CM | POA: Diagnosis not present

## 2023-06-03 DIAGNOSIS — I10 Essential (primary) hypertension: Secondary | ICD-10-CM | POA: Diagnosis not present

## 2023-06-03 DIAGNOSIS — Z23 Encounter for immunization: Secondary | ICD-10-CM | POA: Diagnosis not present

## 2023-06-03 DIAGNOSIS — Z87891 Personal history of nicotine dependence: Secondary | ICD-10-CM | POA: Diagnosis not present

## 2023-06-03 DIAGNOSIS — E1122 Type 2 diabetes mellitus with diabetic chronic kidney disease: Secondary | ICD-10-CM | POA: Diagnosis not present

## 2023-06-03 DIAGNOSIS — R944 Abnormal results of kidney function studies: Secondary | ICD-10-CM | POA: Diagnosis not present

## 2023-06-03 DIAGNOSIS — D849 Immunodeficiency, unspecified: Secondary | ICD-10-CM | POA: Diagnosis not present

## 2023-06-03 DIAGNOSIS — Z86718 Personal history of other venous thrombosis and embolism: Secondary | ICD-10-CM | POA: Diagnosis not present

## 2023-06-03 DIAGNOSIS — E1169 Type 2 diabetes mellitus with other specified complication: Secondary | ICD-10-CM | POA: Diagnosis not present

## 2023-06-03 DIAGNOSIS — N1831 Chronic kidney disease, stage 3a: Secondary | ICD-10-CM | POA: Diagnosis not present

## 2023-06-06 DIAGNOSIS — Z944 Liver transplant status: Secondary | ICD-10-CM | POA: Diagnosis not present

## 2023-06-10 DIAGNOSIS — Z944 Liver transplant status: Secondary | ICD-10-CM | POA: Diagnosis not present

## 2023-06-20 DIAGNOSIS — K862 Cyst of pancreas: Secondary | ICD-10-CM | POA: Diagnosis not present

## 2023-06-26 ENCOUNTER — Telehealth: Payer: Self-pay

## 2023-06-26 DIAGNOSIS — H2513 Age-related nuclear cataract, bilateral: Secondary | ICD-10-CM | POA: Diagnosis not present

## 2023-06-26 DIAGNOSIS — Z79899 Other long term (current) drug therapy: Secondary | ICD-10-CM | POA: Diagnosis not present

## 2023-06-26 DIAGNOSIS — H25013 Cortical age-related cataract, bilateral: Secondary | ICD-10-CM | POA: Diagnosis not present

## 2023-06-26 DIAGNOSIS — E1169 Type 2 diabetes mellitus with other specified complication: Secondary | ICD-10-CM | POA: Diagnosis not present

## 2023-06-26 DIAGNOSIS — H52223 Regular astigmatism, bilateral: Secondary | ICD-10-CM | POA: Diagnosis not present

## 2023-06-26 DIAGNOSIS — H524 Presbyopia: Secondary | ICD-10-CM | POA: Diagnosis not present

## 2023-06-26 DIAGNOSIS — H5203 Hypermetropia, bilateral: Secondary | ICD-10-CM | POA: Diagnosis not present

## 2023-06-26 DIAGNOSIS — E119 Type 2 diabetes mellitus without complications: Secondary | ICD-10-CM | POA: Diagnosis not present

## 2023-06-26 NOTE — Telephone Encounter (Signed)
 Copied from CRM [id number]. Topic: Clinical - Lab/Test Results >> Jun 26, 2023  9:15 AM Truddie Crumble wrote: Reason for CRM: patient spouse called stating the patient need to get his A1C checked because [facility] released him and they stated the patient need to get it checked every three months

## 2023-06-26 NOTE — Telephone Encounter (Signed)
 Please advise. It appears it was last collected in Feb so the patient would not need a recheck until May?

## 2023-06-27 NOTE — Telephone Encounter (Signed)
 Left message for spouse advising that he can schedule for a recheck on or after 08/31/23

## 2023-06-27 NOTE — Telephone Encounter (Signed)
 Agreed, 08/31/23 or afterward.  If he has sig sugar changes int he meantime, then please let me know.  Thanks.

## 2023-07-09 DIAGNOSIS — D849 Immunodeficiency, unspecified: Secondary | ICD-10-CM | POA: Diagnosis not present

## 2023-07-09 DIAGNOSIS — K862 Cyst of pancreas: Secondary | ICD-10-CM | POA: Diagnosis not present

## 2023-07-09 DIAGNOSIS — Z944 Liver transplant status: Secondary | ICD-10-CM | POA: Diagnosis not present

## 2023-07-22 ENCOUNTER — Other Ambulatory Visit: Payer: Self-pay | Admitting: Family Medicine

## 2023-07-22 MED ORDER — DEXCOM G7 SENSOR MISC: 11 refills | Status: DC

## 2023-07-22 MED ORDER — DEXCOM G7 RECEIVER DEVI: 0 refills | Status: DC

## 2023-07-22 NOTE — Telephone Encounter (Signed)
 Copied from CRM [id number]. Topic: Clinical - Medication Refill >> Jul 22, 2023 10:52 AM Saverio Danker wrote: Most Recent Primary Care Visit:  Provider: Joaquim Nam  Department: [facility]  Visit Type: PHYSICAL  Date: 05/26/2023  Medication: Continuous Blood Gluc Receiver (DEXCOM G7 RECEIVER) DEVI Continuous Blood Gluc Sensor (DEXCOM G7 SENSOR) MISC    Has the patient contacted their pharmacy? Yes (Agent: If no, request that the patient contact the pharmacy for the refill. If patient does not wish to contact the pharmacy document the reason why and proceed with request.) (Agent: If yes, when and what did the pharmacy advise?)  Is this the correct pharmacy for this prescription? Yes If no, delete pharmacy and type the correct one.  This is the patient's preferred pharmacy:   [facility] [place], [place] - [address] [place] [address] Phone: [phone] Fax: [phone]   Has the prescription been filled recently? No  Is the patient out of the medication? No, using last one   Has the patient been seen for an appointment in the last year OR does the patient have an upcoming appointment? Yes  Can we respond through MyChart? No  Agent: Please be advised that Rx refills may take up to 3 business days. We ask that you follow-up with your pharmacy.

## 2023-08-06 ENCOUNTER — Encounter: Payer: Self-pay | Admitting: *Deleted

## 2023-08-06 ENCOUNTER — Other Ambulatory Visit: Payer: Self-pay | Admitting: Family Medicine

## 2023-08-06 ENCOUNTER — Ambulatory Visit: Payer: Self-pay

## 2023-08-06 DIAGNOSIS — Z944 Liver transplant status: Secondary | ICD-10-CM | POA: Diagnosis not present

## 2023-08-06 DIAGNOSIS — D849 Immunodeficiency, unspecified: Secondary | ICD-10-CM | POA: Diagnosis not present

## 2023-08-06 MED ORDER — DEXCOM G7 SENSOR MISC: 3 refills | Status: DC

## 2023-08-06 MED ORDER — DEXCOM G7 RECEIVER DEVI: 0 refills | Status: DC

## 2023-08-06 NOTE — Telephone Encounter (Signed)
 Plz notify ERx to [employer]

## 2023-08-06 NOTE — Telephone Encounter (Signed)
 This RN attempted call to patient and spouse. No answer. LVM. (Attempt #1)

## 2023-08-06 NOTE — Telephone Encounter (Signed)
 2nd attempt, LVM requesting return call.

## 2023-08-06 NOTE — Telephone Encounter (Signed)
 Spoke wit patient spouse who is listed on DPR advised rx has been sent

## 2023-08-06 NOTE — Telephone Encounter (Signed)
 Copied from CRM [id number]. Topic: Clinical - Prescription Issue >> Aug 06, 2023  8:26 AM Kita Perish H wrote: Reason for CRM: Patients wife states that the patients Continuous Glucose Sensor (DEXCOM G7 SENSOR) MIS was sent to the [facility] and Dr. Vallarie Gauze usually sends prescription through [facility] to have filled, Debra isn't sure if it was Regulatory affairs officer or [employer] that it's usually sent to, but [employer] is charging patient stating that insurance won't pay them to fill prescription, please reach out to patient or wife, thanks.  Debra/Doug [phone]

## 2023-08-06 NOTE — Telephone Encounter (Signed)
 3rd attempt-LVM, no contact made, routing to clinic.  Reason for Disposition . Third attempt to contact caller AND no contact made. Phone number verified.  Protocols used: No Contact or Duplicate Contact Call-A-AH

## 2023-08-06 NOTE — Telephone Encounter (Signed)
 This encounter was created in error - please disregard.

## 2023-08-06 NOTE — Telephone Encounter (Signed)
  Chief Complaint: Medication Refill   Disposition: [] ED /[] Urgent Care (no appt availability in office) / [] Appointment(In office/virtual)/ []  [facility]/ [] Home Care/ [] Refused Recommended Disposition /[] [facility]/ [x]  Follow-up with PCP  Additional Notes: The patient is needing a refill on the receiver as he is completely out.   Call was lost as VDI failed during conversation. Upon restoration, attempted to call the patient back. Unable to reach the patient at this time.

## 2023-08-06 NOTE — Telephone Encounter (Signed)
  Chief Complaint: requesting Dexcom G7 sensor to be sent to mail order pharmacy and not [place] pharmacy due to insurance will not cover price and patient can not afford. Symptoms: out of sensors x 2 weeks can check blood glucose via finger stick. Wife reports blood sugars are WNL Frequency: 2 weeks  Pertinent Negatives: Patient denies na  Disposition: [] ED /[] Urgent Care (no appt availability in office) / [] Appointment(In office/virtual)/ []  [facility]/ [x] Home Care/ [] Refused Recommended Disposition /[] [facility]/ []  Follow-up with PCP Additional Notes:   Will send refill to [facility] special[facility] pharmacy . Please advise if new Rx needed.       Reason for Disposition  Patient has refills remaining on their prescription  Answer Assessment - Initial Assessment Questions 1. DRUG NAME: "What medicine do you need to have refilled?"     Dexcom G7 sensor 2. REFILLS REMAINING: "How many refills are remaining?" (Note: The label on the medicine or pill bottle will show how many refills are remaining. If there are no refills remaining, then a renewal may be needed.)     Na  3. EXPIRATION DATE: "What is the expiration date?" (Note: The label states when the prescription will expire, and thus can no longer be refilled.)     na 4. PRESCRIBING HCP: "Who prescribed it?" Reason: If prescribed by specialist, call should be referred to that group.     Dr. Vallarie Gauze 5. SYMPTOMS: "Do you have any symptoms?"     Insurance will not cover when Rx sent to [facility] pharmacy per patient wife on DPR  6. PREGNANCY: "Is there any chance that you are pregnant?" "When was your last menstrual period?"     na  Protocols used: Medication Refill and Renewal Call-A-AH

## 2023-08-19 NOTE — Telephone Encounter (Signed)
 Copied from CRM [id number]. Topic: Clinical - Prescription Issue >> Aug 19, 2023 10:08 AM Kita Perish H wrote: Reason for CRM: Patients wife is calling regarding patients Continuous Glucose Sensor (DEXCOM G7 SENSOR), wife states they still have received sensors and systems shows pharmacy received request on 4/16, please reach out thanks.  Georgette Kins [phone]

## 2023-08-21 ENCOUNTER — Other Ambulatory Visit: Payer: Self-pay

## 2023-08-21 MED ORDER — DEXCOM G7 RECEIVER DEVI: 0 refills | Status: AC

## 2023-08-21 MED ORDER — DEXCOM G7 SENSOR MISC: 3 refills | Status: AC

## 2023-08-21 MED ORDER — DEXCOM G7 RECEIVER DEVI: 0 refills | Status: DC

## 2023-08-21 MED ORDER — DEXCOM G7 SENSOR MISC: 3 refills | Status: DC

## 2023-08-25 ENCOUNTER — Encounter: Payer: Self-pay | Admitting: Pharmacist

## 2023-08-25 NOTE — Progress Notes (Signed)
 Patient has requested refill of CGM sensors to [facility] DME supplier.   Order initiated via [employer] complete. [facility] will fax order to clinic for signature by Dr. Vallarie Gauze, Fx [phone].    Supplier:  [facility] [employer]

## 2023-09-05 DIAGNOSIS — T8643 Liver transplant infection: Secondary | ICD-10-CM | POA: Diagnosis not present

## 2023-09-05 DIAGNOSIS — E1122 Type 2 diabetes mellitus with diabetic chronic kidney disease: Secondary | ICD-10-CM | POA: Diagnosis not present

## 2023-09-05 DIAGNOSIS — N1831 Chronic kidney disease, stage 3a: Secondary | ICD-10-CM | POA: Diagnosis not present

## 2023-09-05 DIAGNOSIS — Z86711 Personal history of pulmonary embolism: Secondary | ICD-10-CM | POA: Diagnosis not present

## 2023-09-05 DIAGNOSIS — I129 Hypertensive chronic kidney disease with stage 1 through stage 4 chronic kidney disease, or unspecified chronic kidney disease: Secondary | ICD-10-CM | POA: Diagnosis not present

## 2023-09-05 DIAGNOSIS — D849 Immunodeficiency, unspecified: Secondary | ICD-10-CM | POA: Diagnosis not present

## 2023-09-05 DIAGNOSIS — Z86718 Personal history of other venous thrombosis and embolism: Secondary | ICD-10-CM | POA: Diagnosis not present

## 2023-09-05 DIAGNOSIS — Z7901 Long term (current) use of anticoagulants: Secondary | ICD-10-CM | POA: Diagnosis not present

## 2023-09-18 DIAGNOSIS — Z87891 Personal history of nicotine dependence: Secondary | ICD-10-CM | POA: Diagnosis not present

## 2023-09-18 DIAGNOSIS — K7460 Unspecified cirrhosis of liver: Secondary | ICD-10-CM | POA: Diagnosis not present

## 2023-09-18 DIAGNOSIS — J849 Interstitial pulmonary disease, unspecified: Secondary | ICD-10-CM | POA: Diagnosis not present

## 2023-09-18 DIAGNOSIS — Z86718 Personal history of other venous thrombosis and embolism: Secondary | ICD-10-CM | POA: Diagnosis not present

## 2023-09-18 DIAGNOSIS — R0600 Dyspnea, unspecified: Secondary | ICD-10-CM | POA: Diagnosis not present

## 2023-09-18 DIAGNOSIS — C220 Liver cell carcinoma: Secondary | ICD-10-CM | POA: Diagnosis not present

## 2023-09-18 DIAGNOSIS — K7581 Nonalcoholic steatohepatitis (NASH): Secondary | ICD-10-CM | POA: Diagnosis not present

## 2023-10-14 DIAGNOSIS — Z7901 Long term (current) use of anticoagulants: Secondary | ICD-10-CM | POA: Diagnosis not present

## 2023-10-14 DIAGNOSIS — I129 Hypertensive chronic kidney disease with stage 1 through stage 4 chronic kidney disease, or unspecified chronic kidney disease: Secondary | ICD-10-CM | POA: Diagnosis not present

## 2023-10-14 DIAGNOSIS — Z86711 Personal history of pulmonary embolism: Secondary | ICD-10-CM | POA: Diagnosis not present

## 2023-10-14 DIAGNOSIS — K862 Cyst of pancreas: Secondary | ICD-10-CM | POA: Diagnosis not present

## 2023-10-14 DIAGNOSIS — E1122 Type 2 diabetes mellitus with diabetic chronic kidney disease: Secondary | ICD-10-CM | POA: Diagnosis not present

## 2023-10-14 DIAGNOSIS — Z944 Liver transplant status: Secondary | ICD-10-CM | POA: Diagnosis not present

## 2023-10-14 DIAGNOSIS — C220 Liver cell carcinoma: Secondary | ICD-10-CM | POA: Diagnosis not present

## 2023-10-14 DIAGNOSIS — D849 Immunodeficiency, unspecified: Secondary | ICD-10-CM | POA: Diagnosis not present

## 2023-10-14 DIAGNOSIS — Z79624 Long term (current) use of inhibitors of nucleotide synthesis: Secondary | ICD-10-CM | POA: Diagnosis not present

## 2023-10-14 DIAGNOSIS — R7301 Impaired fasting glucose: Secondary | ICD-10-CM | POA: Diagnosis not present

## 2023-10-14 DIAGNOSIS — Z86718 Personal history of other venous thrombosis and embolism: Secondary | ICD-10-CM | POA: Diagnosis not present

## 2023-10-14 DIAGNOSIS — Z87891 Personal history of nicotine dependence: Secondary | ICD-10-CM | POA: Diagnosis not present

## 2023-10-14 DIAGNOSIS — Z1159 Encounter for screening for other viral diseases: Secondary | ICD-10-CM | POA: Diagnosis not present

## 2023-10-14 DIAGNOSIS — N1831 Chronic kidney disease, stage 3a: Secondary | ICD-10-CM | POA: Diagnosis not present

## 2023-10-14 DIAGNOSIS — K760 Fatty (change of) liver, not elsewhere classified: Secondary | ICD-10-CM | POA: Diagnosis not present

## 2023-11-03 ENCOUNTER — Ambulatory Visit (INDEPENDENT_AMBULATORY_CARE_PROVIDER_SITE_OTHER): Admitting: Family Medicine

## 2023-11-03 ENCOUNTER — Encounter: Payer: Self-pay | Admitting: Family Medicine

## 2023-11-03 VITALS — BP 124/82 | HR 75 | Temp 98.7°F | Ht 71.5 in | Wt 244.4 lb

## 2023-11-03 DIAGNOSIS — H269 Unspecified cataract: Secondary | ICD-10-CM | POA: Diagnosis not present

## 2023-11-03 DIAGNOSIS — E119 Type 2 diabetes mellitus without complications: Secondary | ICD-10-CM | POA: Diagnosis not present

## 2023-11-03 NOTE — Patient Instructions (Addendum)
 Let me know if you can't get your meter or the appointment with [facility].  Take care.  Glad to see you. Recheck in about 3 months with A1c at the visit.  You don't need to fast.   Update me as needed.

## 2023-11-03 NOTE — Progress Notes (Unsigned)
 Diabetes:  No meds.   Hypoglycemic episodes: no sx Hyperglycemic episodes: no sx Feet problems: some occ tingling at night.  Better than prior.  Blood Sugars averaging: he had not been able to check his sugar, couldn't get his meter.  D/w pt.  See below.   eye exam within last year: yes, [facility].    Most recent A1c 6.6.  Prev 6.3.  Diet d/w pt.  Dexcom would likely help with mgmt more than meds at this point.   Phone number given re: [facility] to call about dexcom coverage.  Pt/family can call and update me as needed.    He was asking about seeing [facility] re: cataracts.   D/w pt about [facility].  No FCNAVD.  No abd bloating.  No jaundice.    Meds, vitals, and allergies reviewed.   ROS: Per HPI unless specifically indicated in ROS section   GEN: nad, alert and oriented HEENT: mucous membranes moist NECK: supple w/o LA CV: rrr. PULM: ctab, no inc wob ABD: soft, +bs EXT: trace BLE edema SKIN: well perfused. No jaundice.

## 2023-11-05 DIAGNOSIS — M3501 Sicca syndrome with keratoconjunctivitis: Secondary | ICD-10-CM | POA: Diagnosis not present

## 2023-11-05 DIAGNOSIS — H2513 Age-related nuclear cataract, bilateral: Secondary | ICD-10-CM | POA: Diagnosis not present

## 2023-11-05 DIAGNOSIS — H269 Unspecified cataract: Secondary | ICD-10-CM | POA: Insufficient documentation

## 2023-11-05 DIAGNOSIS — H43813 Vitreous degeneration, bilateral: Secondary | ICD-10-CM | POA: Diagnosis not present

## 2023-11-05 NOTE — Assessment & Plan Note (Signed)
 -  Refer to ophthalmology

## 2023-11-05 NOTE — Assessment & Plan Note (Signed)
 Most recent A1c 6.6.  Prev 6.3.  Diet d/w pt.  Dexcom would likely help with mgmt more than meds at this point.   Phone number given re: [employer] to call about dexcom coverage.  Pt/family can call and update me as needed.    Recheck in about 3 months with A1c at the visit.

## 2023-11-10 ENCOUNTER — Telehealth: Payer: Self-pay | Admitting: Family Medicine

## 2023-11-10 DIAGNOSIS — E1165 Type 2 diabetes mellitus with hyperglycemia: Secondary | ICD-10-CM | POA: Diagnosis not present

## 2023-11-10 NOTE — Telephone Encounter (Signed)
 Spoke with patient and he realized that he was to have labs done but for another provider at [facility]

## 2023-11-10 NOTE — Telephone Encounter (Signed)
 Pt came by office for labs. Pt stated during his ov with Dr. Cleatus on 7/14, he was told to return on today for labs. No appt was made nor were lab orders placed for pt. Let pt know Dr. Cleatus is out of the office until 7/28. Per check out notes from 7/14, pt was told to return in 3 months, October 2025, for A1c recheck. Please advice. Call back # [phone]

## 2023-11-11 DIAGNOSIS — D849 Immunodeficiency, unspecified: Secondary | ICD-10-CM | POA: Diagnosis not present

## 2023-11-11 DIAGNOSIS — Z944 Liver transplant status: Secondary | ICD-10-CM | POA: Diagnosis not present

## 2023-11-17 ENCOUNTER — Other Ambulatory Visit: Payer: Self-pay

## 2023-11-17 ENCOUNTER — Encounter: Payer: Self-pay | Admitting: Ophthalmology

## 2023-11-18 NOTE — Anesthesia Preprocedure Evaluation (Addendum)
 Anesthesia Evaluation  Patient identified by MRN, date of birth, ID band Patient awake    Reviewed: Allergy & Precautions, H&P , NPO status , Patient's Chart, lab work & pertinent test results  Airway Mallampati: III  TM Distance: >3 FB Neck ROM: Full    Dental no notable dental hx. (+) Partial Lower   Pulmonary neg pulmonary ROS, former smoker   Pulmonary exam normal breath sounds clear to auscultation       Cardiovascular hypertension, + Peripheral Vascular Disease and +CHF  negative cardio ROS Normal cardiovascular exam Rhythm:Regular Rate:Normal  09-19-21  1. Left ventricular ejection fraction, by estimation, is 60 to 65%. The  left ventricle has normal function. The left ventricle has no regional  wall motion abnormalities. Left ventricular diastolic parameters were  normal.   2. Right ventricular systolic function is normal. The right ventricular  size is normal.   3. Left atrial size was mildly dilated.   4. Right atrial size was mildly dilated.   5. The mitral valve is normal in structure. Trivial mitral valve  regurgitation. No evidence of mitral stenosis.   6. The aortic valve is normal in structure. Aortic valve regurgitation is  not visualized. No aortic stenosis is present.   7. The inferior vena cava is normal in size with greater than 50%  respiratory variability, suggesting right atrial pressure of 3 mmHg.      Neuro/Psych  Headaches negative neurological ROS  negative psych ROS   GI/Hepatic negative GI ROS, Neg liver ROS,,,  Endo/Other  negative endocrine ROSdiabetes    Renal/GU negative Renal ROS  negative genitourinary   Musculoskeletal negative musculoskeletal ROS (+)    Abdominal   Peds negative pediatric ROS (+)  Hematology negative hematology ROS (+)   Anesthesia Other Findings Unspecified arthropathy, ankle and foot  Elevated blood pressure reading without diagnosis of  hypertension Pulmonary embolism Lumbago Acute venous embolism and thrombosis of unspecified deep vessels of lower extremity  Type II or unspecified type diabetes mellitus without mention of complication, not stated as uncontrolled Polyp of colon  Compression fx, lumbar spine (HCC) Headache  CHF (congestive heart failure) (HCC) Cirrhosis of liver (HCC)  Peripheral vascular disease (HCC) Cancer (HCC) 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, plans to take his anti-rejection medication at 0900 today     Reproductive/Obstetrics negative OB ROS                              Anesthesia Physical Anesthesia Plan  ASA: 3  Anesthesia Plan: MAC   Post-op Pain Management:    Induction: Intravenous  PONV Risk Score and Plan:   Airway Management Planned: Natural Airway and Nasal Cannula  Additional Equipment:   Intra-op Plan:   Post-operative Plan:   Informed Consent: I have reviewed the patients History and Physical, chart, labs and discussed the procedure including the risks, benefits and alternatives for the proposed anesthesia with the patient or authorized representative who has indicated his/her understanding and acceptance.     Dental Advisory Given  Plan Discussed with: Anesthesiologist, CRNA and Surgeon  Anesthesia Plan Comments: (Patient consented for risks of anesthesia including but not limited to:  - adverse reactions to medications - damage to eyes, teeth, lips or other oral mucosa - nerve damage due to positioning  - sore throat or hoarseness - Damage to heart, brain, nerves, lungs, other parts of body or loss of life  Patient voiced understanding  and assent.)         Anesthesia Quick Evaluation

## 2023-11-19 DIAGNOSIS — H2513 Age-related nuclear cataract, bilateral: Secondary | ICD-10-CM | POA: Diagnosis not present

## 2023-11-19 DIAGNOSIS — H2512 Age-related nuclear cataract, left eye: Secondary | ICD-10-CM | POA: Diagnosis not present

## 2023-11-19 DIAGNOSIS — H2511 Age-related nuclear cataract, right eye: Secondary | ICD-10-CM | POA: Diagnosis not present

## 2023-11-27 NOTE — Discharge Instructions (Signed)

## 2023-12-01 DIAGNOSIS — Z944 Liver transplant status: Secondary | ICD-10-CM | POA: Diagnosis not present

## 2023-12-01 DIAGNOSIS — D849 Immunodeficiency, unspecified: Secondary | ICD-10-CM | POA: Diagnosis not present

## 2023-12-02 ENCOUNTER — Encounter: Payer: Self-pay | Admitting: Ophthalmology

## 2023-12-02 ENCOUNTER — Other Ambulatory Visit: Payer: Self-pay

## 2023-12-02 ENCOUNTER — Ambulatory Visit
Admission: RE | Admit: 2023-12-02 | Discharge: 2023-12-02 | Disposition: A | Discharge status: Home / Self Care | Attending: Ophthalmology | Admitting: Ophthalmology

## 2023-12-02 ENCOUNTER — Ambulatory Visit: Payer: Self-pay | Admitting: Anesthesiology

## 2023-12-02 ENCOUNTER — Encounter
Admission: RE | Disposition: A | Discharge status: Home / Self Care | Payer: Self-pay | Source: Home / Self Care | Attending: Ophthalmology

## 2023-12-02 DIAGNOSIS — Z87891 Personal history of nicotine dependence: Secondary | ICD-10-CM | POA: Insufficient documentation

## 2023-12-02 DIAGNOSIS — E1151 Type 2 diabetes mellitus with diabetic peripheral angiopathy without gangrene: Secondary | ICD-10-CM | POA: Insufficient documentation

## 2023-12-02 DIAGNOSIS — I110 Hypertensive heart disease with heart failure: Secondary | ICD-10-CM | POA: Diagnosis not present

## 2023-12-02 DIAGNOSIS — I509 Heart failure, unspecified: Secondary | ICD-10-CM | POA: Insufficient documentation

## 2023-12-02 DIAGNOSIS — E1136 Type 2 diabetes mellitus with diabetic cataract: Secondary | ICD-10-CM | POA: Diagnosis not present

## 2023-12-02 DIAGNOSIS — I5032 Chronic diastolic (congestive) heart failure: Secondary | ICD-10-CM | POA: Diagnosis not present

## 2023-12-02 DIAGNOSIS — H2512 Age-related nuclear cataract, left eye: Secondary | ICD-10-CM | POA: Diagnosis not present

## 2023-12-02 HISTORY — PX: CATARACT EXTRACTION W/PHACO: SHX586

## 2023-12-02 SURGERY — PHACOEMULSIFICATION, CATARACT, WITH IOL INSERTION
Anesthesia: Monitor Anesthesia Care | Laterality: Left

## 2023-12-02 MED ORDER — FENTANYL CITRATE (PF) 100 MCG/2ML IJ SOLN
INTRAMUSCULAR | Status: AC
Start: 2023-12-02 — End: 2023-12-02
  Filled 2023-12-02: qty 2

## 2023-12-02 MED ORDER — ARMC OPHTHALMIC DILATING DROPS
1.0000 | OPHTHALMIC | Status: DC | PRN
  Administered 2023-12-02 (×6): 1 via OPHTHALMIC

## 2023-12-02 MED ORDER — DEXMEDETOMIDINE HCL IN NACL 200 MCG/50ML IV SOLN
INTRAVENOUS | Status: DC | PRN
  Administered 2023-12-02 (×2): 8 ug via INTRAVENOUS

## 2023-12-02 MED ORDER — LACTATED RINGERS IV SOLN: INTRAVENOUS | Status: DC

## 2023-12-02 MED ORDER — TETRACAINE HCL 0.5 % OP SOLN
1.0000 [drp] | OPHTHALMIC | Status: DC | PRN
  Administered 2023-12-02 (×6): 1 [drp] via OPHTHALMIC

## 2023-12-02 MED ORDER — ARMC OPHTHALMIC DILATING DROPS
OPHTHALMIC | Status: AC
  Filled 2023-12-02: qty 0.5

## 2023-12-02 MED ORDER — DEXMEDETOMIDINE HCL IN NACL 80 MCG/20ML IV SOLN
INTRAVENOUS | Status: AC
  Filled 2023-12-02: qty 20

## 2023-12-02 MED ORDER — MIDAZOLAM HCL 2 MG/2ML IJ SOLN
INTRAMUSCULAR | Status: AC
  Filled 2023-12-02: qty 2

## 2023-12-02 MED ORDER — TETRACAINE HCL 0.5 % OP SOLN
OPHTHALMIC | Status: AC
  Filled 2023-12-02: qty 4

## 2023-12-02 MED ORDER — SIGHTPATH DOSE#1 NA CHONDROIT SULF-NA HYALURON 40-17 MG/ML IO SOLN
INTRAOCULAR | Status: DC | PRN
  Administered 2023-12-02 (×2): 1 mL via INTRAOCULAR

## 2023-12-02 MED ORDER — MOXIFLOXACIN HCL 0.5 % OP SOLN
OPHTHALMIC | Status: DC | PRN
  Administered 2023-12-02 (×2): .2 mL via OPHTHALMIC

## 2023-12-02 MED ORDER — SIGHTPATH DOSE#1 BSS IO SOLN
INTRAOCULAR | Status: DC | PRN
  Administered 2023-12-02 (×2): 15 mL via INTRAOCULAR

## 2023-12-02 MED ORDER — BRIMONIDINE TARTRATE-TIMOLOL 0.2-0.5 % OP SOLN
OPHTHALMIC | Status: DC | PRN
  Administered 2023-12-02 (×2): 1 [drp] via OPHTHALMIC

## 2023-12-02 MED ORDER — SIGHTPATH DOSE#1 BSS IO SOLN
INTRAOCULAR | Status: DC | PRN
  Administered 2023-12-02 (×2): 58 mL via OPHTHALMIC

## 2023-12-02 MED ORDER — FENTANYL CITRATE (PF) 100 MCG/2ML IJ SOLN
INTRAMUSCULAR | Status: DC | PRN
  Administered 2023-12-02 (×2): 100 ug via INTRAVENOUS

## 2023-12-02 MED ORDER — LIDOCAINE HCL (PF) 2 % IJ SOLN
INTRAOCULAR | Status: DC | PRN
  Administered 2023-12-02 (×2): 1 mL

## 2023-12-02 SURGICAL SUPPLY — 10 items
CYSTOTOME ANGL RVRS SHRT 25G (CUTTER) ×1 IMPLANT
FEE CATARACT SUITE SIGHTPATH (MISCELLANEOUS) ×1 IMPLANT
GLOVE BIOGEL PI IND STRL 8 (GLOVE) ×1 IMPLANT
GLOVE SURG LX STRL 8.0 MICRO (GLOVE) ×1 IMPLANT
GLOVE SURG SYN 6.5 PF PI BL (GLOVE) ×1 IMPLANT
LENS IOL TECNIS EYHANCE 21.0 (Intraocular Lens) IMPLANT
NDL FILTER BLUNT 18X1 1/2 (NEEDLE) ×1 IMPLANT
NEEDLE FILTER BLUNT 18X1 1/2 (NEEDLE) ×1 IMPLANT
SUT NYLON 10-0 (SUTURE) IMPLANT
SYR 3ML LL SCALE MARK (SYRINGE) ×1 IMPLANT

## 2023-12-02 NOTE — H&P (Signed)
 [facility]   Primary Care Physician:  Cleatus Arlyss RAMAN, MD Ophthalmologist: Dr. Jaye  Pre-Procedure History & Physical: HPI:  Tim Hodge is a 68 y.o. male here for cataract surgery.   Past Medical History:  Diagnosis Date   Acute venous embolism and thrombosis of unspecified deep vessels of lower extremity    Cancer (HCC)    CHF (congestive heart failure) (HCC)    Cirrhosis of liver (HCC)    due to fatty liver   Compression fx, lumbar spine (HCC)    2017   Elevated blood pressure reading without diagnosis of hypertension    Headache    Liver transplant recipient [facility])    Lumbago    Peripheral vascular disease (HCC)    Polyp of colon    Pulmonary embolism (HCC)    Type II or unspecified type diabetes mellitus without mention of complication, not stated as uncontrolled    Unspecified arthropathy, ankle and foot     Past Surgical History:  Procedure Laterality Date   COLONOSCOPY WITH PROPOFOL  N/A 11/10/2019   Procedure: COLONOSCOPY WITH PROPOFOL ;  Surgeon: Toledo, Ladell POUR, MD;  Location: [facility] ENDOSCOPY;  Service: Gastroenterology;  Laterality: N/A;   CYSTOSCOPY W/ RETROGRADES Bilateral 11/26/2021   Procedure: CYSTOSCOPY WITH RETROGRADE PYELOGRAM;  Surgeon: Penne Knee, MD;  Location: [facility] ORS;  Service: Urology;  Laterality: Bilateral;   CYSTOSCOPY WITH BIOPSY N/A 11/26/2021   Procedure: CYSTOSCOPY WITH BLADDER BIOPSY;  Surgeon: Penne Knee, MD;  Location: [facility] ORS;  Service: Urology;  Laterality: N/A;   FOOT SURGERY Left    HAND SURGERY Right    IR RADIOLOGIST EVAL & MGMT  11/08/2021   IR RADIOLOGIST EVAL & MGMT  02/11/2022   KYPHOPLASTY N/A 07/11/2016   Procedure: LUMBAR 2 KYPHOPLASTY;  Surgeon: Oneil Priestly, MD;  Location: [facility] OR;  Service: Orthopedics;  Laterality: N/A;  LUMBAR 2 KYPHOPLASTY   KYPHOPLASTY  2021   LAMINECTOMY  ~1998   LIVER TRANSPLANT     RADIOLOGY WITH ANESTHESIA N/A 01/09/2022   Procedure: CT MICROWAVE ABLATION;  Surgeon:  Johann Sieving, MD;  Location: [facility] ORS;  Service: Radiology;  Laterality: N/A;    Prior to Admission medications   Medication Sig Start Date End Date Taking? Authorizing Provider  apixaban (ELIQUIS) 2.5 MG TABS tablet Take 2.5 mg by mouth 2 (two) times daily.   Yes [provider]  calcium carbonate (OS-CAL) 1250 (500 Ca) MG chewable tablet Chew 1 tablet by mouth daily.   Yes [provider]  Continuous Glucose Receiver (DEXCOM G7 RECEIVER) DEVI Use as directed 08/21/23  Yes Cleatus Arlyss RAMAN, MD  Continuous Glucose Sensor (DEXCOM G7 SENSOR) MISC Apply sensor every 10 days 08/21/23  Yes Cleatus Arlyss RAMAN, MD  ferrous sulfate 325 (65 FE) MG tablet Take by mouth.   Yes [provider]  magnesium oxide (MAG-OX) 400 MG tablet Take by mouth. 12/10/22  Yes [provider]  mycophenolate (CELLCEPT) 250 MG capsule Take 1,000 mg by mouth 2 (two) times daily. 01/16/23  Yes [provider]  tacrolimus  (PROGRAF ) 1 MG capsule Take 2 mg by mouth 2 (two) times daily.   Yes [provider]  VEMLIDY 25 MG tablet Take 25 mg by mouth daily.   Yes [provider]    Allergies as of 11/12/2023 - Review Complete 11/03/2023  Allergen Reaction Noted   Iodinated contrast media Shortness Of Breath and Rash 03/01/2013   Flexeril  [cyclobenzaprine ]  02/21/2017   Iodine  Other (See Comments) 05/26/2019  Tramadol  Other (See Comments) 04/08/2017   Vioxx [rofecoxib] Other (See Comments) 03/01/2013   Metformin and related Other (See Comments) 03/01/2013   Valium  [diazepam ] Rash 02/21/2017    Family History  Problem Relation Age of Onset   Breast cancer Mother    Alzheimer's disease Mother    Breast cancer Sister    Stroke Father    Colon cancer Neg Hx    Prostate cancer Neg Hx     Social History   Socioeconomic History   Marital status: Married    Spouse name: Not on file   Number of children: Not on file   Years of education: Not on file   Highest  education level: 11th grade  Occupational History   Not on file  Tobacco Use   Smoking status: Former    Types: Cigarettes   Smokeless tobacco: Never  Vaping Use   Vaping status: Never Used  Substance and Sexual Activity   Alcohol use: Not Currently   Drug use: No   Sexual activity: Yes  Other Topics Concern   Not on file  Social History Narrative   2 kids, local   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, 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   Married 1979   Social Drivers of Corporate investment banker Strain: Low Risk  (04/23/2023)   Overall Financial Resource Strain (CARDIA)    Difficulty of Paying Living Expenses: Not hard at all  Food Insecurity: No Food Insecurity (04/23/2023)   Hunger Vital Sign    Worried About Running Out of Food in the Last Year: Never true    Ran Out of Food in the Last Year: Never true  Transportation Needs: No Transportation Needs (04/23/2023)   PRAPARE - Administrator, Civil Service (Medical): No    Lack of Transportation (Non-Medical): No  Physical Activity: Insufficiently Active (04/23/2023)   Exercise Vital Sign    Days of Exercise per Week: 1 day    Minutes of Exercise per Session: 30 min  Stress: No Stress Concern Present (04/23/2023)   [employer] of Occupational Health - Occupational Stress Questionnaire    Feeling of Stress : Not at all  Social Connections: Moderately Isolated (04/23/2023)   Social Connection and Isolation Panel    Frequency of Communication with Friends and Family: More than three times a week    Frequency of Social Gatherings with Friends and Family: Twice a week    Attends Religious Services: Never    Database administrator or Organizations: No    Attends Banker Meetings: Never    Marital Status: Married  Catering manager Violence: Not At Risk (02/10/2023)   Humiliation, Afraid, Rape, and Kick questionnaire    Fear of Current or Ex-Partner: No    Emotionally Abused: No    Physically Abused: No    Sexually Abused:  No    Review of Systems: See HPI, otherwise negative ROS  Physical Exam: BP (!) 150/80   Pulse (!) 58   Temp 98.4 F (36.9 C) (Temporal)   Resp 12   Ht 5' 11.5 (1.816 m)   Wt 110.4 kg   SpO2 97%   BMI 33.47 kg/m  General:   Alert, cooperative. Head:  Normocephalic and atraumatic. Respiratory:  Normal work of breathing. Cardiovascular:  NAD  Impression/Plan: Tim Hodge is here for cataract surgery.  Risks, benefits, limitations, and alternatives regarding cataract surgery have been reviewed with the patient.  Questions have been answered.  All parties agreeable.  Elsie Carmine, MD  12/02/2023, 7:17 AM

## 2023-12-02 NOTE — Transfer of Care (Signed)
 Immediate Anesthesia Transfer of Care Note  Patient: Tim Hodge  Procedure(s) Performed: PHACOEMULSIFICATION, CATARACT, WITH IOL INSERTION 5.57, 00:43.9 (Left)  Patient Location: PACU  Anesthesia Type: MAC  Level of Consciousness: awake, alert  and patient cooperative  Airway and Oxygen  Therapy: Patient Spontanous Breathing and Patient connected to supplemental oxygen   Post-op Assessment: Post-op Vital signs reviewed, Patient's Cardiovascular Status Stable, Respiratory Function Stable, Patent Airway and No signs of Nausea or vomiting  Post-op Vital Signs: Reviewed and stable  Complications: No notable events documented.

## 2023-12-02 NOTE — Anesthesia Postprocedure Evaluation (Signed)
 Anesthesia Post Note  Patient: Tim Hodge  Procedure(s) Performed: PHACOEMULSIFICATION, CATARACT, WITH IOL INSERTION 5.57, 00:43.9 (Left)  Patient location during evaluation: PACU Anesthesia Type: MAC Level of consciousness: awake and alert Pain management: pain level controlled Vital Signs Assessment: post-procedure vital signs reviewed and stable Respiratory status: spontaneous breathing, nonlabored ventilation, respiratory function stable and patient connected to nasal cannula oxygen  Cardiovascular status: stable and blood pressure returned to baseline Postop Assessment: no apparent nausea or vomiting Anesthetic complications: no   No notable events documented.   Last Vitals:  Vitals:   12/02/23 0752 12/02/23 0758  BP: 126/84 121/85  Pulse: (!) 57 (!) 56  Resp: 18 (!) 22  Temp: (!) 36.2 C   SpO2: 98% 98%    Last Pain:  Vitals:   12/02/23 0758  TempSrc:   PainSc: 0-No pain                 Donny JAYSON Mu

## 2023-12-02 NOTE — Op Note (Signed)
 PREOPERATIVE DIAGNOSIS:  Nuclear sclerotic cataract of the left eye.   POSTOPERATIVE DIAGNOSIS:  Nuclear sclerotic cataract of the left eye.   OPERATIVE PROCEDURE:ORPROCALL@   SURGEON:  Tim Carmine, MD.   ANESTHESIA:  Anesthesiologist: Ola Donny BROCKS, MD CRNA: Dave Maus, CRNA  1.      Managed anesthesia care. 2.     0.27ml of Shugarcaine was instilled following the paracentesis   COMPLICATIONS:  None. A single 10-0 suture was placed in the main incision due to the pts immunocomp. Status.   TECHNIQUE:   Stop and chop   DESCRIPTION OF PROCEDURE:  The patient was examined and consented in the preoperative holding area where the aforementioned topical anesthesia was applied to the left eye and then brought back to the Operating Room where the left eye was prepped and draped in the usual sterile ophthalmic fashion and a lid speculum was placed. A paracentesis was created with the side port blade and the anterior chamber was filled with viscoelastic. A near clear corneal incision was performed with the steel keratome. A continuous curvilinear capsulorrhexis was performed with a cystotome followed by the capsulorrhexis forceps. Hydrodissection and hydrodelineation were carried out with BSS on a blunt cannula. The lens was removed in a stop and chop  technique and the remaining cortical material was removed with the irrigation-aspiration handpiece. The capsular bag was inflated with viscoelastic and the Technis ZCB00 lens was placed in the capsular bag without complication. The remaining viscoelastic was removed from the eye with the irrigation-aspiration handpiece. The wounds were hydrated. The anterior chamber was flushed with BSS and the eye was inflated to physiologic pressure. 0.14ml Vigamox  was placed in the anterior chamber. The wounds were found to be water  tight. The eye was dressed with Combigan . The patient was given protective glasses to wear throughout the day and a shield with  which to sleep tonight. The patient was also given drops with which to begin a drop regimen today and will follow-up with me in one day. Implant Name Type Inv. Item Serial No. Manufacturer Lot No. LRB No. Used Action  LENS IOL TECNIS EYHANCE 21.0 - [id number] Intraocular Lens LENS IOL TECNIS EYHANCE 21.0 [id number] [employer]  Left 1 Implanted    Procedure(s): PHACOEMULSIFICATION, CATARACT, WITH IOL INSERTION 5.57, 00:43.9 (Left)  Electronically signed: Elsie Hodge 12/02/2023 7:53 AM

## 2023-12-03 DIAGNOSIS — H2511 Age-related nuclear cataract, right eye: Secondary | ICD-10-CM | POA: Diagnosis not present

## 2023-12-04 ENCOUNTER — Encounter: Payer: Self-pay | Admitting: Ophthalmology

## 2023-12-10 DIAGNOSIS — D485 Neoplasm of uncertain behavior of skin: Secondary | ICD-10-CM | POA: Diagnosis not present

## 2023-12-10 DIAGNOSIS — C44519 Basal cell carcinoma of skin of other part of trunk: Secondary | ICD-10-CM | POA: Diagnosis not present

## 2023-12-10 DIAGNOSIS — D2272 Melanocytic nevi of left lower limb, including hip: Secondary | ICD-10-CM | POA: Diagnosis not present

## 2023-12-10 DIAGNOSIS — D2262 Melanocytic nevi of left upper limb, including shoulder: Secondary | ICD-10-CM | POA: Diagnosis not present

## 2023-12-10 DIAGNOSIS — D2261 Melanocytic nevi of right upper limb, including shoulder: Secondary | ICD-10-CM | POA: Diagnosis not present

## 2023-12-10 DIAGNOSIS — D2271 Melanocytic nevi of right lower limb, including hip: Secondary | ICD-10-CM | POA: Diagnosis not present

## 2023-12-10 DIAGNOSIS — D225 Melanocytic nevi of trunk: Secondary | ICD-10-CM | POA: Diagnosis not present

## 2023-12-10 DIAGNOSIS — L821 Other seborrheic keratosis: Secondary | ICD-10-CM | POA: Diagnosis not present

## 2023-12-10 DIAGNOSIS — L570 Actinic keratosis: Secondary | ICD-10-CM | POA: Diagnosis not present

## 2023-12-15 NOTE — Discharge Instructions (Signed)

## 2023-12-16 ENCOUNTER — Ambulatory Visit
Admission: RE | Admit: 2023-12-16 | Discharge: 2023-12-16 | Disposition: A | Discharge status: Home / Self Care | Attending: Ophthalmology | Admitting: Ophthalmology

## 2023-12-16 ENCOUNTER — Encounter: Payer: Self-pay | Admitting: Ophthalmology

## 2023-12-16 ENCOUNTER — Ambulatory Visit: Payer: Self-pay | Admitting: Anesthesiology

## 2023-12-16 ENCOUNTER — Encounter
Admission: RE | Disposition: A | Discharge status: Home / Self Care | Payer: Self-pay | Source: Home / Self Care | Attending: Ophthalmology

## 2023-12-16 ENCOUNTER — Other Ambulatory Visit: Payer: Self-pay

## 2023-12-16 DIAGNOSIS — E1136 Type 2 diabetes mellitus with diabetic cataract: Secondary | ICD-10-CM | POA: Diagnosis not present

## 2023-12-16 DIAGNOSIS — Z86718 Personal history of other venous thrombosis and embolism: Secondary | ICD-10-CM | POA: Diagnosis not present

## 2023-12-16 DIAGNOSIS — Z7901 Long term (current) use of anticoagulants: Secondary | ICD-10-CM | POA: Insufficient documentation

## 2023-12-16 DIAGNOSIS — I5032 Chronic diastolic (congestive) heart failure: Secondary | ICD-10-CM | POA: Diagnosis not present

## 2023-12-16 DIAGNOSIS — H2511 Age-related nuclear cataract, right eye: Secondary | ICD-10-CM | POA: Diagnosis not present

## 2023-12-16 DIAGNOSIS — Z86711 Personal history of pulmonary embolism: Secondary | ICD-10-CM | POA: Insufficient documentation

## 2023-12-16 DIAGNOSIS — I110 Hypertensive heart disease with heart failure: Secondary | ICD-10-CM | POA: Insufficient documentation

## 2023-12-16 DIAGNOSIS — I509 Heart failure, unspecified: Secondary | ICD-10-CM | POA: Diagnosis not present

## 2023-12-16 DIAGNOSIS — Z87891 Personal history of nicotine dependence: Secondary | ICD-10-CM | POA: Diagnosis not present

## 2023-12-16 DIAGNOSIS — Z79624 Long term (current) use of inhibitors of nucleotide synthesis: Secondary | ICD-10-CM | POA: Insufficient documentation

## 2023-12-16 DIAGNOSIS — E1151 Type 2 diabetes mellitus with diabetic peripheral angiopathy without gangrene: Secondary | ICD-10-CM | POA: Insufficient documentation

## 2023-12-16 DIAGNOSIS — Z944 Liver transplant status: Secondary | ICD-10-CM | POA: Diagnosis not present

## 2023-12-16 HISTORY — PX: CATARACT EXTRACTION W/PHACO: SHX586

## 2023-12-16 SURGERY — PHACOEMULSIFICATION, CATARACT, WITH IOL INSERTION
Anesthesia: Monitor Anesthesia Care | Site: Eye | Laterality: Right

## 2023-12-16 MED ORDER — FENTANYL CITRATE (PF) 100 MCG/2ML IJ SOLN
INTRAMUSCULAR | Status: DC | PRN
  Administered 2023-12-16 (×2): 50 ug via INTRAVENOUS

## 2023-12-16 MED ORDER — FENTANYL CITRATE (PF) 100 MCG/2ML IJ SOLN
INTRAMUSCULAR | Status: AC
Start: 2023-12-16 — End: 2023-12-16
  Filled 2023-12-16: qty 2

## 2023-12-16 MED ORDER — SIGHTPATH DOSE#1 NA CHONDROIT SULF-NA HYALURON 40-17 MG/ML IO SOLN
INTRAOCULAR | Status: DC | PRN
  Administered 2023-12-16: 1 mL via INTRAOCULAR

## 2023-12-16 MED ORDER — SIGHTPATH DOSE#1 BSS IO SOLN
INTRAOCULAR | Status: DC | PRN
  Administered 2023-12-16: 55 mL via OPHTHALMIC

## 2023-12-16 MED ORDER — TETRACAINE HCL 0.5 % OP SOLN
OPHTHALMIC | Status: AC
  Filled 2023-12-16: qty 4

## 2023-12-16 MED ORDER — MOXIFLOXACIN HCL 0.5 % OP SOLN
OPHTHALMIC | Status: DC | PRN
  Administered 2023-12-16: .2 mL via OPHTHALMIC

## 2023-12-16 MED ORDER — ARMC OPHTHALMIC DILATING DROPS
1.0000 | OPHTHALMIC | Status: DC | PRN
  Administered 2023-12-16 (×3): 1 via OPHTHALMIC

## 2023-12-16 MED ORDER — TETRACAINE HCL 0.5 % OP SOLN
1.0000 [drp] | OPHTHALMIC | Status: DC | PRN
  Administered 2023-12-16 (×3): 1 [drp] via OPHTHALMIC

## 2023-12-16 MED ORDER — MIDAZOLAM HCL 2 MG/2ML IJ SOLN
INTRAMUSCULAR | Status: DC | PRN
  Administered 2023-12-16: 2 mg via INTRAVENOUS

## 2023-12-16 MED ORDER — LACTATED RINGERS IV SOLN: INTRAVENOUS | Status: DC

## 2023-12-16 MED ORDER — SIGHTPATH DOSE#1 BSS IO SOLN
INTRAOCULAR | Status: DC | PRN
  Administered 2023-12-16: 15 mL via INTRAOCULAR

## 2023-12-16 MED ORDER — LIDOCAINE HCL (PF) 2 % IJ SOLN
INTRAOCULAR | Status: DC | PRN
  Administered 2023-12-16: 2 mL

## 2023-12-16 MED ORDER — MIDAZOLAM HCL 2 MG/2ML IJ SOLN
INTRAMUSCULAR | Status: AC
  Filled 2023-12-16: qty 2

## 2023-12-16 MED ORDER — ARMC OPHTHALMIC DILATING DROPS
OPHTHALMIC | Status: AC
Start: 2023-12-16 — End: 2023-12-16
  Filled 2023-12-16: qty 0.5

## 2023-12-16 MED ORDER — BRIMONIDINE TARTRATE-TIMOLOL 0.2-0.5 % OP SOLN
OPHTHALMIC | Status: DC | PRN
  Administered 2023-12-16: 1 [drp] via OPHTHALMIC

## 2023-12-16 SURGICAL SUPPLY — 10 items
CYSTOTOME ANGL RVRS SHRT 25G (CUTTER) ×1 IMPLANT
FEE CATARACT SUITE SIGHTPATH (MISCELLANEOUS) ×1 IMPLANT
GLOVE BIOGEL PI IND STRL 8 (GLOVE) ×1 IMPLANT
GLOVE SURG LX STRL 8.0 MICRO (GLOVE) ×1 IMPLANT
GLOVE SURG SYN 6.5 PF PI BL (GLOVE) ×1 IMPLANT
LENS IOL TECNIS EYHANCE 21.0 (Intraocular Lens) IMPLANT
NDL FILTER BLUNT 18X1 1/2 (NEEDLE) ×1 IMPLANT
NEEDLE FILTER BLUNT 18X1 1/2 (NEEDLE) ×1 IMPLANT
SUTURE EHLN 10-0 CS-B-6CS-B-6 (SUTURE) IMPLANT
SYR 3ML LL SCALE MARK (SYRINGE) ×1 IMPLANT

## 2023-12-16 NOTE — Anesthesia Postprocedure Evaluation (Signed)
 Anesthesia Post Note  Patient: Tim Hodge  Procedure(s) Performed: PHACOEMULSIFICATION, CATARACT, WITH IOL INSERTION 4.92 00:32.4 (Right: Eye)  Patient location during evaluation: PACU Anesthesia Type: MAC Level of consciousness: awake and alert Pain management: pain level controlled Vital Signs Assessment: post-procedure vital signs reviewed and stable Respiratory status: spontaneous breathing, nonlabored ventilation, respiratory function stable and patient connected to nasal cannula oxygen  Cardiovascular status: blood pressure returned to baseline and stable Postop Assessment: no apparent nausea or vomiting Anesthetic complications: no   No notable events documented.   Last Vitals:  Vitals:   12/16/23 0641  BP: 130/72  Resp: 18  Temp: 36.8 C  SpO2: 96%    Last Pain:  Vitals:   12/16/23 0641  TempSrc: Temporal  PainSc: 0-No pain                 Lynwood KANDICE Clause

## 2023-12-16 NOTE — Op Note (Signed)
 PREOPERATIVE DIAGNOSIS:  Nuclear sclerotic cataract of the right eye.   POSTOPERATIVE DIAGNOSIS:  Right Eye Cataract   OPERATIVE PROCEDURE:ORPROCALL@   SURGEON:  Elsie Carmine, MD.   ANESTHESIA:  Anesthesiologist: Myra Lynwood MATSU, MD CRNA: Veronica Alm BROCKS, CRNA  1.      Managed anesthesia care. 2.      0.83ml of Shugarcaine was instilled in the eye following the paracentesis.   COMPLICATIONS:  a single 10-0 suture was placed due to the pts immunocomp. status   TECHNIQUE:   Stop and chop   DESCRIPTION OF PROCEDURE:  The patient was examined and consented in the preoperative holding area where the aforementioned topical anesthesia was applied to the right eye and then brought back to the Operating Room where the right eye was prepped and draped in the usual sterile ophthalmic fashion and a lid speculum was placed. A paracentesis was created with the side port blade and the anterior chamber was filled with viscoelastic. A near clear corneal incision was performed with the steel keratome. A continuous curvilinear capsulorrhexis was performed with a cystotome followed by the capsulorrhexis forceps. Hydrodissection and hydrodelineation were carried out with BSS on a blunt cannula. The lens was removed in a stop and chop  technique and the remaining cortical material was removed with the irrigation-aspiration handpiece. The capsular bag was inflated with viscoelastic and the Technis ZCB00  lens was placed in the capsular bag without complication. The remaining viscoelastic was removed from the eye with the irrigation-aspiration handpiece. The wounds were hydrated. The anterior chamber was flushed with BSS and the eye was inflated to physiologic pressure. 0.1ml of Vigamox  was placed in the anterior chamber. The wounds were found to be water  tight. The eye was dressed with Combigan . The patient was given protective glasses to wear throughout the day and a shield with which to sleep tonight. The patient  was also given drops with which to begin a drop regimen today and will follow-up with me in one day. Implant Name Type Inv. Item Serial No. Manufacturer Lot No. LRB No. Used Action  LENS IOL TECNIS EYHANCE 21.0 - [id number] Intraocular Lens LENS IOL TECNIS EYHANCE 21.0 [id number] [employer]  Right 1 Implanted   Procedure(s): PHACOEMULSIFICATION, CATARACT, WITH IOL INSERTION 4.92 00:32.4 (Right)  Electronically signed: Elsie Carmine 12/16/2023 7:50 AM

## 2023-12-16 NOTE — Transfer of Care (Signed)
 Immediate Anesthesia Transfer of Care Note  Patient: Tim Hodge  Procedure(s) Performed: PHACOEMULSIFICATION, CATARACT, WITH IOL INSERTION 4.92 00:32.4 (Right: Eye)  Patient Location: PACU  Anesthesia Type: MAC  Level of Consciousness: awake, alert  and patient cooperative  Airway and Oxygen  Therapy: Patient Spontanous Breathing and Patient connected to supplemental oxygen   Post-op Assessment: Post-op Vital signs reviewed, Patient's Cardiovascular Status Stable, Respiratory Function Stable, Patent Airway and No signs of Nausea or vomiting  Post-op Vital Signs: Reviewed and stable  Complications: No notable events documented.

## 2023-12-16 NOTE — H&P (Signed)
 [facility]   Primary Care Physician:  Cleatus Arlyss RAMAN, MD Ophthalmologist: Dr. Jaye  Pre-Procedure History & Physical: HPI:  Tim Hodge is a 68 y.o. male here for cataract surgery.   Past Medical History:  Diagnosis Date   Acute venous embolism and thrombosis of unspecified deep vessels of lower extremity    Cancer (HCC)    CHF (congestive heart failure) (HCC)    Cirrhosis of liver (HCC)    due to fatty liver   Compression fx, lumbar spine (HCC)    2017   Elevated blood pressure reading without diagnosis of hypertension    Headache    Liver transplant recipient Tim Hodge)    Lumbago    Peripheral vascular disease (HCC)    Polyp of colon    Pulmonary embolism (HCC)    Type II or unspecified type diabetes mellitus without mention of complication, not stated as uncontrolled    Unspecified arthropathy, ankle and foot     Past Surgical History:  Procedure Laterality Date   CATARACT EXTRACTION W/PHACO Left 12/02/2023   Procedure: PHACOEMULSIFICATION, CATARACT, WITH IOL INSERTION 5.57, 00:43.9;  Surgeon: Jaye Fallow, MD;  Location: [facility];  Service: Ophthalmology;  Laterality: Left;   COLONOSCOPY WITH PROPOFOL  N/A 11/10/2019   Procedure: COLONOSCOPY WITH PROPOFOL ;  Surgeon: Toledo, Ladell POUR, MD;  Location: [facility];  Service: Gastroenterology;  Laterality: N/A;   CYSTOSCOPY W/ RETROGRADES Bilateral 11/26/2021   Procedure: CYSTOSCOPY WITH RETROGRADE PYELOGRAM;  Surgeon: Penne Knee, MD;  Location: [facility];  Service: Urology;  Laterality: Bilateral;   CYSTOSCOPY WITH BIOPSY N/A 11/26/2021   Procedure: CYSTOSCOPY WITH BLADDER BIOPSY;  Surgeon: Penne Knee, MD;  Location: [facility];  Service: Urology;  Laterality: N/A;   FOOT SURGERY Left    HAND SURGERY Right    IR RADIOLOGIST EVAL & MGMT  11/08/2021   IR RADIOLOGIST EVAL & MGMT  02/11/2022   KYPHOPLASTY N/A 07/11/2016   Procedure: LUMBAR 2 KYPHOPLASTY;  Surgeon: Oneil Priestly, MD;   Location: [facility];  Service: Orthopedics;  Laterality: N/A;  LUMBAR 2 KYPHOPLASTY   KYPHOPLASTY  2021   LAMINECTOMY  ~1998   LIVER TRANSPLANT     RADIOLOGY WITH ANESTHESIA N/A 01/09/2022   Procedure: CT MICROWAVE ABLATION;  Surgeon: Johann Sieving, MD;  Location: [facility];  Service: Radiology;  Laterality: N/A;    Prior to Admission medications   Medication Sig Start Date End Date Taking? Authorizing Provider  apixaban (ELIQUIS) 2.5 MG TABS tablet Take 2.5 mg by mouth 2 (two) times daily.   Yes [provider]  calcium carbonate (OS-CAL) 1250 (500 Ca) MG chewable tablet Chew 1 tablet by mouth daily.   Yes [provider]  ferrous sulfate 325 (65 FE) MG tablet Take by mouth.   Yes [provider]  magnesium oxide (MAG-OX) 400 MG tablet Take by mouth. 12/10/22  Yes [provider]  mycophenolate (CELLCEPT) 250 MG capsule Take 1,000 mg by mouth 2 (two) times daily. 01/16/23  Yes [provider]  tacrolimus  (PROGRAF ) 1 MG capsule Take 2 mg by mouth 2 (two) times daily.   Yes [provider]  VEMLIDY 25 MG tablet Take 25 mg by mouth daily.   Yes [provider]  Continuous Glucose Receiver (DEXCOM G7 RECEIVER) DEVI Use as directed 08/21/23   Cleatus Arlyss RAMAN, MD  Continuous Glucose Sensor (DEXCOM G7 SENSOR) MISC Apply sensor every 10 days 08/21/23   Cleatus Arlyss RAMAN, MD    Allergies as of 11/12/2023 -  Review Complete 11/03/2023  Allergen Reaction Noted   Iodinated contrast media Shortness Of Breath and Rash 03/01/2013   Flexeril  [cyclobenzaprine ]  02/21/2017   Iodine  Other (See Comments) 05/26/2019   Tramadol  Other (See Comments) 04/08/2017   Vioxx [rofecoxib] Other (See Comments) 03/01/2013   Metformin and related Other (See Comments) 03/01/2013   Valium  [diazepam ] Rash 02/21/2017    Family History  Problem Relation Age of Onset   Breast cancer Mother    Alzheimer's disease Mother    Breast cancer Sister    Stroke Father    Colon  cancer Neg Hx    Prostate cancer Neg Hx     Social History   Socioeconomic History   Marital status: Married    Spouse name: Not on file   Number of children: Not on file   Years of education: Not on file   Highest education level: 11th grade  Occupational History   Not on file  Tobacco Use   Smoking status: Former    Types: Cigarettes   Smokeless tobacco: Never  Vaping Use   Vaping status: Never Used  Substance and Sexual Activity   Alcohol use: Not Currently   Drug use: No   Sexual activity: Yes  Other Topics Concern   Not on file  Social History Narrative   2 kids, local   [employer], 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   Married 1979   Social Drivers of Corporate investment banker Strain: Low Risk  (04/23/2023)   Overall Financial Resource Strain (CARDIA)    Difficulty of Paying Living Expenses: Not hard at all  Food Insecurity: No Food Insecurity (04/23/2023)   Hunger Vital Sign    Worried About Running Out of Food in the Last Year: Never true    Ran Out of Food in the Last Year: Never true  Transportation Needs: No Transportation Needs (04/23/2023)   PRAPARE - Administrator, Civil Service (Medical): No    Lack of Transportation (Non-Medical): No  Physical Activity: Insufficiently Active (04/23/2023)   Exercise Vital Sign    Days of Exercise per Week: 1 day    Minutes of Exercise per Session: 30 min  Stress: No Stress Concern Present (04/23/2023)   [employer] of Occupational Health - Occupational Stress Questionnaire    Feeling of Stress : Not at all  Social Connections: Moderately Isolated (04/23/2023)   Social Connection and Isolation Panel    Frequency of Communication with Friends and Family: More than three times a week    Frequency of Social Gatherings with Friends and Family: Twice a week    Attends Religious Services: Never    Database administrator or Organizations: No    Attends Banker Meetings: Never    Marital Status:  Married  Catering manager Violence: Not At Risk (02/10/2023)   Humiliation, Afraid, Rape, and Kick questionnaire    Fear of Current or Ex-Partner: No    Emotionally Abused: No    Physically Abused: No    Sexually Abused: No    Review of Systems: See HPI, otherwise negative ROS  Physical Exam: BP 130/72   Temp 98.2 F (36.8 C) (Temporal)   Resp 18   Ht 5' 11.5 (1.816 m)   Wt 111.4 kg   SpO2 96%   BMI 33.78 kg/m  General:   Alert, cooperative. Head:  Normocephalic and atraumatic. Respiratory:  Normal work of breathing. Cardiovascular:  NAD  Impression/Plan: DEUNTAE KOCSIS is here for cataract  surgery.  Risks, benefits, limitations, and alternatives regarding cataract surgery have been reviewed with the patient.  Questions have been answered.  All parties agreeable.   Elsie Carmine, MD  12/16/2023, 7:20 AM

## 2023-12-16 NOTE — Anesthesia Postprocedure Evaluation (Signed)
 Anesthesia Post Note  Patient: ISEAH PLOUFF  Procedure(s) Performed: PHACOEMULSIFICATION, CATARACT, WITH IOL INSERTION 4.92 00:32.4 (Right: Eye)  Patient location during evaluation: PACU Anesthesia Type: MAC Level of consciousness: awake and alert Pain management: pain level controlled Vital Signs Assessment: post-procedure vital signs reviewed and stable Respiratory status: spontaneous breathing, nonlabored ventilation, respiratory function stable and patient connected to nasal cannula oxygen  Cardiovascular status: stable and blood pressure returned to baseline Postop Assessment: no apparent nausea or vomiting Anesthetic complications: no   No notable events documented.   Last Vitals:  Vitals:   12/16/23 0752 12/16/23 0756  BP: 123/82 126/77  Pulse: (!) 58 (!) 57  Resp: 15 11  Temp: (!) 36.3 C (!) 36.3 C  SpO2: 98% 98%    Last Pain:  Vitals:   12/16/23 0756  TempSrc:   PainSc: 0-No pain                 Lynwood KANDICE Clause

## 2023-12-16 NOTE — Anesthesia Preprocedure Evaluation (Addendum)
 Anesthesia Evaluation  Patient identified by MRN, date of birth, ID band Patient awake    Reviewed: Allergy & Precautions, H&P , NPO status , Patient's Chart, lab work & pertinent test results, reviewed documented beta blocker date and time   Airway Mallampati: II  TM Distance: >3 FB Neck ROM: full    Dental no notable dental hx. (+) Teeth Intact   Pulmonary former smoker   Pulmonary exam normal breath sounds clear to auscultation       Cardiovascular Exercise Tolerance: Good hypertension, On Medications + Peripheral Vascular Disease and +CHF   Rhythm:regular Rate:Normal     Neuro/Psych  Headaches  negative psych ROS   GI/Hepatic negative GI ROS, Neg liver ROS,,,  Endo/Other  diabetes, Well Controlled    Renal/GU      Musculoskeletal   Abdominal   Peds  Hematology negative hematology ROS (+)   Anesthesia Other Findings   Reproductive/Obstetrics negative OB ROS                              Anesthesia Physical Anesthesia Plan  ASA: 3  Anesthesia Plan: MAC   Post-op Pain Management:    Induction:   PONV Risk Score and Plan:   Airway Management Planned:   Additional Equipment:   Intra-op Plan:   Post-operative Plan:   Informed Consent: I have reviewed the patients History and Physical, chart, labs and discussed the procedure including the risks, benefits and alternatives for the proposed anesthesia with the patient or authorized representative who has indicated his/her understanding and acceptance.       Plan Discussed with: CRNA  Anesthesia Plan Comments:          Anesthesia Quick Evaluation

## 2024-01-21 DIAGNOSIS — C44519 Basal cell carcinoma of skin of other part of trunk: Secondary | ICD-10-CM | POA: Diagnosis not present

## 2024-02-05 ENCOUNTER — Ambulatory Visit: Admitting: Family Medicine

## 2024-02-05 ENCOUNTER — Encounter: Payer: Self-pay | Admitting: Family Medicine

## 2024-02-05 VITALS — BP 132/72 | HR 69 | Temp 98.0°F | Ht 71.5 in | Wt 252.8 lb

## 2024-02-05 DIAGNOSIS — I2699 Other pulmonary embolism without acute cor pulmonale: Secondary | ICD-10-CM

## 2024-02-05 DIAGNOSIS — E119 Type 2 diabetes mellitus without complications: Secondary | ICD-10-CM | POA: Diagnosis not present

## 2024-02-05 DIAGNOSIS — Z23 Encounter for immunization: Secondary | ICD-10-CM | POA: Diagnosis not present

## 2024-02-05 DIAGNOSIS — R0989 Other specified symptoms and signs involving the circulatory and respiratory systems: Secondary | ICD-10-CM

## 2024-02-05 LAB — MICROALBUMIN / CREATININE URINE RATIO
Creatinine,U: 102.4 mg/dL
Microalb Creat Ratio: 9.3 mg/g (ref 0.0–30.0)
Microalb, Ur: 1 mg/dL (ref 0.0–1.9)

## 2024-02-05 LAB — POCT GLYCOSYLATED HEMOGLOBIN (HGB A1C): Hemoglobin A1C: 6.6 % — AB (ref 4.0–5.6)

## 2024-02-05 NOTE — Progress Notes (Signed)
 Diabetes:  No meds.  Hypoglycemic episodes: rare at night, x2 in the last year.  Cautions d/w pt.  It self corrected- unclear if a correct reading.  Hyperglycemic episodes: no Feet problems: occ tingling at night.  Blood Sugars averaging: usually ~150s in early AM, lower later in the day, 1-2 hours after meals.   eye exam within last year: yes A1c stable at 6.6   MALB pending.  Flu shot day.    No bleeding on anticoagulation.   He had B cataract surgery with clear improvement.   PMH and SH reviewed  Meds, vitals, and allergies reviewed.   ROS: Per HPI unless specifically indicated in ROS section   GEN: nad, alert and oriented HEENT: mucous membranes moist NECK: supple w/o LA CV: rrr. PULM: ctab, no inc wob ABD: soft, +bs EXT: no edema SKIN: well perfused.  No jaundice.    Diabetic foot exam: Normal inspection No skin breakdown No calluses  nonpalp DP pulses Normal sensation to light touch and monofilament Nails normal

## 2024-02-05 NOTE — Patient Instructions (Addendum)
 Go to the lab on the way out.   If you have mychart we'll likely use that to update you.    Take care.  Glad to see you. Please ask the front about a record release from [facility].  Please request records from 2025.    Plan on recheck in about 6 months at a yearly visit with labs ahead of time.   Let me know if you can't get set up for the leg ultrasounds.

## 2024-02-08 DIAGNOSIS — R0989 Other specified symptoms and signs involving the circulatory and respiratory systems: Secondary | ICD-10-CM | POA: Insufficient documentation

## 2024-02-08 NOTE — Assessment & Plan Note (Signed)
 A1c stable at 6.6   MALB pending.  Flu shot day.   Plan on recheck in about 6 months at a yearly visit with labs ahead of time.  No change in meds today.

## 2024-02-08 NOTE — Assessment & Plan Note (Addendum)
 Normal capillary refill.  Refer for arterial ultrasounds.  He can let me know if he cannot get set up.  See after visit summary.  He agrees to plan.

## 2024-02-09 ENCOUNTER — Ambulatory Visit: Payer: Self-pay | Admitting: Family Medicine

## 2024-02-09 DIAGNOSIS — R0989 Other specified symptoms and signs involving the circulatory and respiratory systems: Secondary | ICD-10-CM

## 2024-02-11 ENCOUNTER — Telehealth: Payer: Self-pay | Admitting: Family Medicine

## 2024-02-11 ENCOUNTER — Ambulatory Visit: Payer: Medicare Other

## 2024-02-11 VITALS — Ht 71.5 in | Wt 252.0 lb

## 2024-02-11 DIAGNOSIS — Z0000 Encounter for general adult medical examination without abnormal findings: Secondary | ICD-10-CM | POA: Diagnosis not present

## 2024-02-11 NOTE — Telephone Encounter (Signed)
 Copied from CRM [id number]. Topic: General - Call Back - No Documentation >> Feb 11, 2024  9:38 AM Tim Hodge wrote: Reason for CRM: Patient was supposed to receive a phone call from the hospital to get an appointment to check the pulse in patients feet. He has not received a call yet. Patient doesn't remember the location or name of place.   [phone] (M)

## 2024-02-11 NOTE — Progress Notes (Signed)
 Subjective:   Tim Hodge is a 69 y.o. who presents for a Medicare Wellness preventive visit.  As a reminder, Annual Wellness Visits don't include a physical exam, and some assessments may be limited, especially if this visit is performed virtually. We may recommend an in-person follow-up visit with your provider if needed.  Visit Complete: Virtual I connected with  Tim Hodge on 02/11/24 by a audio enabled telemedicine application and verified that I am speaking with the correct person using two identifiers.  Patient Location: Home  Provider Location: Office/Clinic  I discussed the limitations of evaluation and management by telemedicine. The patient expressed understanding and agreed to proceed.  Vital Signs: Because this visit was a virtual/telehealth visit, some criteria may be missing or patient reported. Any vitals not documented were not able to be obtained and vitals that have been documented are patient reported.  VideoDeclined- This patient declined Librarian, academic. Therefore the visit was completed with audio only.  Persons Participating in Visit: Patient.  AWV Questionnaire: Yes: Patient Medicare AWV questionnaire was completed by the patient on 02/04/24; I have confirmed that all information answered by patient is correct and no changes since this date.  Cardiac Risk Factors include: advanced age (>54men, >46 women);diabetes mellitus;hypertension;male gender;obesity (BMI >30kg/m2);sedentary lifestyle     Objective:    Today's Vitals   02/04/24 1528 02/11/24 0857  Weight:  252 lb (114.3 kg)  Height:  5' 11.5 (1.816 m)  PainSc: 2     Body mass index is 34.66 kg/m.     02/11/2024    9:05 AM 12/16/2023    6:34 AM 12/02/2023    6:34 AM 03/27/2023    8:05 AM 02/10/2023   10:15 AM 02/25/2022   11:37 AM 02/05/2022    9:33 AM  Advanced Directives  Does Patient Have a Medical Advance Directive? No  No No No No No  Would patient  like information on creating a medical advance directive?  No - Patient declined No - Patient declined No - Patient declined Yes (MAU/Ambulatory/Procedural Areas - Information given) No - Patient declined No - Patient declined    Current Medications (verified) Outpatient Encounter Medications as of 02/11/2024  Medication Sig   apixaban (ELIQUIS) 2.5 MG TABS tablet Take 2.5 mg by mouth 2 (two) times daily.   calcium carbonate (OS-CAL) 1250 (500 Ca) MG chewable tablet Chew 1 tablet by mouth daily.   Continuous Glucose Receiver (DEXCOM G7 RECEIVER) DEVI Use as directed   Continuous Glucose Sensor (DEXCOM G7 SENSOR) MISC Apply sensor every 10 days   ferrous sulfate 325 (65 FE) MG tablet Take by mouth.   magnesium oxide (MAG-OX) 400 MG tablet Take by mouth.   mycophenolate (CELLCEPT) 250 MG capsule Take 750 mg by mouth 2 (two) times daily.   tacrolimus  (PROGRAF ) 1 MG capsule Take 2 mg by mouth 2 (two) times daily.   VEMLIDY 25 MG tablet Take 25 mg by mouth daily.   No facility-administered encounter medications on file as of 02/11/2024.    Allergies (verified) Iodinated contrast media, Flexeril  [cyclobenzaprine ], Iodine , Tramadol , Vioxx [rofecoxib], Metformin and related, and Valium  [diazepam ]   History: Past Medical History:  Diagnosis Date   Acute venous embolism and thrombosis of unspecified deep vessels of lower extremity    Allergy    Blood transfusion without reported diagnosis    Cancer (HCC)    CHF (congestive heart failure) (HCC)    Cirrhosis of liver (HCC)    due to  fatty liver   Clotting disorder    Compression fx, lumbar spine (HCC)    2017   Elevated blood pressure reading without diagnosis of hypertension    Headache    Liver transplant recipient [facility])    Lumbago    Peripheral vascular disease    Polyp of colon    Pulmonary embolism (HCC)    Type II or unspecified type diabetes mellitus without mention of complication, not stated as uncontrolled    Unspecified  arthropathy, ankle and foot    Past Surgical History:  Procedure Laterality Date   CATARACT EXTRACTION W/PHACO Left 12/02/2023   Procedure: PHACOEMULSIFICATION, CATARACT, WITH IOL INSERTION 5.57, 00:43.9;  Surgeon: Jaye Fallow, MD;  Location: [facility];  Service: Ophthalmology;  Laterality: Left;   CATARACT EXTRACTION W/PHACO Right 12/16/2023   Procedure: PHACOEMULSIFICATION, CATARACT, WITH IOL INSERTION 4.92 00:32.4;  Surgeon: Jaye Fallow, MD;  Location: [facility];  Service: Ophthalmology;  Laterality: Right;   COLONOSCOPY WITH PROPOFOL  N/A 11/10/2019   Procedure: COLONOSCOPY WITH PROPOFOL ;  Surgeon: Toledo, Ladell POUR, MD;  Location: [facility];  Service: Gastroenterology;  Laterality: N/A;   CYSTOSCOPY W/ RETROGRADES Bilateral 11/26/2021   Procedure: CYSTOSCOPY WITH RETROGRADE PYELOGRAM;  Surgeon: Penne Knee, MD;  Location: [facility] OR[facility];  Service: Urology;  Laterality: Bilateral;   CYSTOSCOPY WITH BIOPSY N/A 11/26/2021   Procedure: CYSTOSCOPY WITH BLADDER BIOPSY;  Surgeon: Penne Knee, MD;  Location: [facility];  Service: Urology;  Laterality: N/A;   FOOT SURGERY Left    FRACTURE SURGERY     HAND SURGERY Right    IR RADIOLOGIST EVAL & MGMT  11/08/2021   IR RADIOLOGIST EVAL & MGMT  02/11/2022   KYPHOPLASTY N/A 07/11/2016   Procedure: LUMBAR 2 KYPHOPLASTY;  Surgeon: Oneil Priestly, MD;  Location: [facility] OR;  Service: Orthopedics;  Laterality: N/A;  LUMBAR 2 KYPHOPLASTY   KYPHOPLASTY  2021   LAMINECTOMY  ~1998   LIVER TRANSPLANT     RADIOLOGY WITH ANESTHESIA N/A 01/09/2022   Procedure: CT MICROWAVE ABLATION;  Surgeon: Johann Sieving, MD;  Location: [facility];  Service: Radiology;  Laterality: N/A;   Family History  Problem Relation Age of Onset   Breast cancer Mother    Alzheimer's disease Mother    Cancer Mother    Diabetes Mother    Breast cancer Sister    Stroke Father    Colon cancer Neg Hx    Prostate cancer Neg Hx    Social History    Socioeconomic History   Marital status: Married    Spouse name: Not on file   Number of children: Not on file   Years of education: Not on file   Highest education level: 11th grade  Occupational History   Not on file  Tobacco Use   Smoking status: Former    Types: Cigarettes   Smokeless tobacco: Never  Vaping Use   Vaping status: Never Used  Substance and Sexual Activity   Alcohol use: Not Currently   Drug use: No   Sexual activity: Yes  Other Topics Concern   Not on file  Social History Narrative   2 kids, local   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[employer], 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   Married 1979   Social Drivers of Corporate investment banker Strain: Low Risk  (02/04/2024)   Overall Financial Resource Strain (CARDIA)    Difficulty of Paying Living Expenses: Not hard at all  Food Insecurity: No Food Insecurity (02/04/2024)   Hunger Vital Sign    Worried  About Running Out of Food in the Last Year: Never true    Ran Out of Food in the Last Year: Never true  Transportation Needs: No Transportation Needs (02/04/2024)   PRAPARE - Administrator, Civil Service (Medical): No    Lack of Transportation (Non-Medical): No  Physical Activity: Insufficiently Active (02/04/2024)   Exercise Vital Sign    Days of Exercise per Week: 3 days    Minutes of Exercise per Session: 40 min  Stress: No Stress Concern Present (02/04/2024)   [employer] of Occupational Health - Occupational Stress Questionnaire    Feeling of Stress: Not at all  Social Connections: Moderately Isolated (02/04/2024)   Social Connection and Isolation Panel    Frequency of Communication with Friends and Family: More than three times a week    Frequency of Social Gatherings with Friends and Family: More than three times a week    Attends Religious Services: Patient declined    Database administrator or Organizations: No    Attends Engineer, structural: Not on file    Marital Status: Married     Tobacco Counseling Counseling given: Not Answered   Clinical Intake:  Pre-visit preparation completed: Yes  Pain : 0-10 Pain Score: 2  Pain Location: Back Pain Orientation: Lower Pain Descriptors / Indicators: Aching Pain Onset: More than a month ago Pain Frequency: Intermittent     BMI - recorded: 34.66 Nutritional Status: BMI > 30  Obese Nutritional Risks: None Diabetes: Yes CBG done?: Yes (BS is 169 after breakfast) CBG resulted in Enter/ Edit results?: No Did pt. bring in CBG monitor from home?: No  Lab Results  Component Value Date   HGBA1C 6.6 (A) 02/05/2024   HGBA1C 6.1 02/11/2023   HGBA1C 5.8 10/29/2021     How often do you need to have someone help you when you read instructions, pamphlets, or other written materials from your doctor or pharmacy?: 1 - Never  Interpreter Needed?: No  Comments: lives with wife Information entered by :: B.B ordeaux,LPN   Activities of Daily Living     02/04/2024    3:28 PM 12/16/2023    6:34 AM  In your present state of health, do you have any difficulty performing the following activities:  Hearing? 0 0  Vision? 0 0  Difficulty concentrating or making decisions? 0 0  Walking or climbing stairs? 0   Dressing or bathing? 0   Doing errands, shopping? 0   Preparing Food and eating ? N   Using the Toilet? N   In the past six months, have you accidently leaked urine? N   Do you have problems with loss of bowel control? N   Managing your Medications? N   Managing your Finances? N   Housekeeping or managing your Housekeeping? N     Patient Care Team: Cleatus Arlyss RAMAN, MD as PCP - General (Family Medicine) Beuford Anes, MD as Consulting Physician (Orthopedic Surgery) Portia Fireman, OD (Optometry) Maurie Rayfield BIRCH, RN as Oncology Nurse Navigator  I have updated your Care Teams any recent Medical Services you may have received from other providers in the past year.     Assessment:   This is a routine  wellness examination for Tim Hodge.  Hearing/Vision screen Hearing Screening - Comments:: Patient denies any hearing difficulties.   Vision Screening - Comments:: Pt says their vision is good without glasses; cataract surgery 6weeks [id number] is good Dr  Jaye   Goals Addressed  This Visit's Progress     DIET - EAT MORE FRUITS AND VEGETABLES        02/11/24      COMPLETED: No Current Goals (pt-stated)        pt stated        I would like to walk more and be more active        Depression Screen     02/11/2024    9:04 AM 02/05/2024    8:11 AM 02/18/2023   11:10 AM 02/10/2023   10:14 AM 02/05/2022    9:31 AM 01/13/2021   11:46 AM 01/13/2021   11:40 AM  PHQ 2/9 Scores  PHQ - 2 Score 0 0 0 0 0 0 0  PHQ- 9 Score  0 0        Fall Risk     02/05/2024    8:11 AM 02/04/2024    3:28 PM 02/18/2023   11:10 AM 02/10/2023   10:09 AM 02/09/2023    9:49 PM  Fall Risk   Falls in the past year? 0 0 0 0 0  Number falls in past yr: 0  0 0   Injury with Fall? 0  0 0 0  Risk for fall due to : No Fall Risks  No Fall Risks No Fall Risks   Follow up Falls evaluation completed  Falls evaluation completed Falls prevention discussed     MEDICARE RISK AT HOME:  Medicare Risk at Home Any stairs in or around the home?: (Patient-Rptd) Yes If so, are there any without handrails?: (Patient-Rptd) No Home free of loose throw rugs in walkways, pet beds, electrical cords, etc?: (Patient-Rptd) Yes Adequate lighting in your home to reduce risk of falls?: (Patient-Rptd) Yes Life alert?: (Patient-Rptd) No Use of a cane, walker or w/c?: (Patient-Rptd) No Grab bars in the bathroom?: (Patient-Rptd) No Shower chair or bench in shower?: (Patient-Rptd) Yes Elevated toilet seat or a handicapped toilet?: (Patient-Rptd) Yes  TIMED UP AND GO:  Was the test performed?  No  Cognitive Function: 6CIT completed        02/11/2024    9:07 AM 02/10/2023   10:16 AM 02/05/2022    9:33 AM  01/13/2021   11:49 AM  6CIT Screen  What Year? 0 points 0 points 0 points 0 points  What month? 0 points 0 points 0 points 0 points  What time? 0 points 0 points 0 points 0 points  Count back from 20 0 points 0 points 0 points 0 points  Months in reverse 4 points 0 points 0 points 4 points  Repeat phrase 8 points 0 points 10 points 4 points  Total Score 12 points 0 points 10 points 8 points    Immunizations Immunization History  Administered Date(s) Administered   Fluad Quad(high Dose 65+) 01/15/2022   Fluad Trivalent(High Dose 65+) 05/26/2023   Hepb-cpg 01/16/2022, 12/10/2022, 06/03/2023   INFLUENZA, HIGH DOSE SEASONAL PF 02/05/2024   Influenza,inj,Quad PF,6+ Mos 03/06/2015, 02/21/2017, 01/16/2018, 02/05/2019, 03/13/2020   Influenza-Unspecified 02/03/2014   Moderna Sars-Covid-2 Vaccination 07/21/2019, 08/18/2019, 04/04/2020   PNEUMOCOCCAL CONJUGATE-20 06/03/2023   Pneumococcal Polysaccharide-23 04/22/2012   Tdap 04/22/2009, 10/07/2014, 09/17/2021    Screening Tests Health Maintenance  Topic Date Due   Zoster Vaccines- Shingrix (1 of 2) Never done   OPHTHALMOLOGY EXAM  04/26/2023   COVID-19 Vaccine (4 - 2025-26 season) 12/22/2023   Diabetic kidney evaluation - eGFR measurement  03/26/2024   HEMOGLOBIN A1C  08/05/2024   Colonoscopy  11/09/2024   Diabetic  kidney evaluation - Urine ACR  02/04/2025   FOOT EXAM  02/04/2025   Medicare Annual Wellness (AWV)  02/10/2025   DTaP/Tdap/Td (4 - Td or Tdap) 09/18/2031   Pneumococcal Vaccine: 50+ Years  Completed   Influenza Vaccine  Completed   Hepatitis C Screening  Completed   Meningococcal B Vaccine  Aged Out   Hepatitis B Vaccines 19-59 Average Risk  Discontinued    Health Maintenance Items Addressed: Pt   Additional Screening:  Vision Screening: Recommended annual ophthalmology exams for early detection of glaucoma and other disorders of the eye. Is the patient up to date with their annual eye exam?  Yes  Who is the  provider or what is the name of the office in which the patient attends annual eye exams? Dr Portia and Dr Jaye  Dental Screening: Recommended annual dental exams for proper oral hygiene  Community Resource Referral / Chronic Care Management: CRR required this visit?  No   CCM required this visit?  No   Plan:    I have personally reviewed and noted the following in the patient's chart:   Medical and social history Use of alcohol, tobacco or illicit drugs  Current medications and supplements including opioid prescriptions. Patient is not currently taking opioid prescriptions. Functional ability and status Nutritional status Physical activity Advanced directives List of other physicians Hospitalizations, surgeries, and ER visits in previous 12 months Vitals Screenings to include cognitive, depression, and falls Referrals and appointments  In addition, I have reviewed and discussed with patient certain preventive protocols, quality metrics, and best practice recommendations. A written personalized care plan for preventive services as well as general preventive health recommendations were provided to patient.   Tim LITTIE Saris, LPN   [id number]   After Visit Summary: (MyChart) Due to this being a telephonic visit, the after visit summary with patients personalized plan was offered to patient via MyChart   Notes: Nothing significant to report at this time.

## 2024-02-11 NOTE — Patient Instructions (Signed)
 [name],  Thank you for taking the time for your Medicare Wellness Visit. I appreciate your continued commitment to your health goals. Please review the care plan we discussed, and feel free to reach out if I can assist you further.  Medicare recommends these wellness visits once per year to help you and your care team stay ahead of potential health issues. These visits are designed to focus on prevention, allowing your provider to concentrate on managing your acute and chronic conditions during your regular appointments.  Please note that Annual Wellness Visits do not include a physical exam. Some assessments may be limited, especially if the visit was conducted virtually. If needed, we may recommend a separate in-person follow-up with your provider.  Ongoing Care Seeing your primary care provider every 3 to 6 months helps us  monitor your health and provide consistent, personalized care.   Referrals If a referral was made during today's visit and you haven't received any updates within two weeks, please contact the referred provider directly to check on the status.  Recommended Screenings:  Health Maintenance  Topic Date Due   Zoster (Shingles) Vaccine (1 of 2) Never done   Eye exam for diabetics  04/26/2023   COVID-19 Vaccine (4 - 2025-26 season) 12/22/2023   Yearly kidney function blood test for diabetes  03/26/2024   Hemoglobin A1C  08/05/2024   Colon Cancer Screening  11/09/2024   Yearly kidney health urinalysis for diabetes  02/04/2025   Complete foot exam   02/04/2025   Medicare Annual Wellness Visit  02/10/2025   DTaP/Tdap/Td vaccine (4 - Td or Tdap) 09/18/2031   Pneumococcal Vaccine for age over 40  Completed   Flu Shot  Completed   Hepatitis C Screening  Completed   Meningitis B Vaccine  Aged Out   Hepatitis B Vaccine  Discontinued       12/16/2023    6:34 AM  Advanced Directives  Would patient like information on creating a medical advance directive? No - Patient  declined   Advance Care Planning is important because it: Ensures you receive medical care that aligns with your values, goals, and preferences. Provides guidance to your family and loved ones, reducing the emotional burden of decision-making during critical moments.  Vision: Annual vision screenings are recommended for early detection of glaucoma, cataracts, and diabetic retinopathy. These exams can also reveal signs of chronic conditions such as diabetes and high blood pressure.  Dental: Annual dental screenings help detect early signs of oral cancer, gum disease, and other conditions linked to overall health, including heart disease and diabetes.  Please see the attached documents for additional preventive care recommendations.

## 2024-02-12 NOTE — Telephone Encounter (Signed)
 I have sent a message to the Referral Coordinator at [facility] to follow up on this referral/order.   It goes straight to her WQ and they contact the patients directly to schedule.

## 2024-02-13 NOTE — Telephone Encounter (Signed)
 Noted

## 2024-02-13 NOTE — Telephone Encounter (Signed)
 Patient is scheduled on 11/24, They called and left him a VM with appt information.  Location: [facility]  He should also be able to see his appt information in his MyChart.

## 2024-03-15 ENCOUNTER — Ambulatory Visit: Attending: Family Medicine

## 2024-03-15 DIAGNOSIS — R0989 Other specified symptoms and signs involving the circulatory and respiratory systems: Secondary | ICD-10-CM | POA: Diagnosis not present

## 2024-03-17 LAB — VAS US LOWER EXT ART SEG MULTI (SEGMENTALS & LE RAYNAUDS)
Left ABI: 1.24
Right ABI: 1.12

## 2024-03-29 ENCOUNTER — Telehealth: Payer: Self-pay | Admitting: *Deleted

## 2024-03-29 NOTE — Telephone Encounter (Signed)
 A call was made about the pt's most recent vascular ultrasound. Please see result note.

## 2024-03-29 NOTE — Telephone Encounter (Signed)
 Copied from CRM [id number]. Topic: Clinical - Medical Advice >> Mar 29, 2024  1:55 PM Tim Hodge wrote: Reason for CRM: Patient is calling in because she received a call, but I see no notes left.Please call patient back.  Thank you.

## 2024-03-30 ENCOUNTER — Ambulatory Visit: Admitting: Cardiovascular Disease

## 2024-04-02 NOTE — Progress Notes (Signed)
 Cardiology Office Note  Date:  04/05/2024   ID:  Aquan, Kope 22-Mar-1956, MRN [id number]  PCP:  Cleatus Arlyss RAMAN, MD   Chief Complaint  Patient presents with   New Patient (Initial Visit)    Referred Dr. Cleatus for decrease dorsalis pedis pulse.     HPI:  Tim Hodge is a 68 y.o. male with past medical history of: Past Medical History:  Diagnosis Date   Acute venous embolism and thrombosis of unspecified deep vessels of lower extremity    Allergy    Blood transfusion without reported diagnosis    Cancer (HCC)    CHF (congestive heart failure) (HCC)    Cirrhosis of liver (HCC)    due to fatty liver   Clotting disorder    Compression fx, lumbar spine (HCC)    2017   Elevated blood pressure reading without diagnosis of hypertension    Headache    Liver transplant recipient [facility])    Lumbago    Peripheral vascular disease    Polyp of colon    Pulmonary embolism (HCC)    Type II or unspecified type diabetes mellitus without mention of complication, not stated as uncontrolled    Unspecified arthropathy, ankle and foot   Remote smoking, teenager Diabetes, A1c 10.2 down to 6 Chronic renal insufficiency Liver transplant 2023 DVT, PE x 2, on eliquis Broke back 2017 Who presents by referral from Dr. Cleatus for PAD, abnormal TBI's  Recent lower extremity arterial Dopplers reviewed Normal ABIs, abnormal TBI's  Reports having prior surgery on his feet Left foot surgery after accident 1997-1998 Reports his foot was crushed  Chronic back pain  Denies claudication type symptoms on exertion  CT scan aorta pelvis pulled up and reviewed showing mild diffuse aortic atherosclerosis, nothing that appears severe  Prior history diabetes, A1c better controlled in the past several years  Lab work reviewed Prior history of poorly controlled diabetes A1c down to 6.6 from 10.2 Total cholesterol 176 LDL 101  EKG personally reviewed by myself on todays visit EKG  Interpretation Date/Time:  Monday April 05 2024 09:37:48 EST Ventricular Rate:  62 PR Interval:  170 QRS Duration:  104 QT Interval:  402 QTC Calculation: 408 R Axis:   -48  Text Interpretation: Normal sinus rhythm Left axis deviation When compared with ECG of 19-Sep-2021 21:37, No significant change was found Confirmed by Perla Lye [phone]) on 04/05/2024 9:52:37 AM    PMH:   has a past medical history of Acute venous embolism and thrombosis of unspecified deep vessels of lower extremity, Allergy, Blood transfusion without reported diagnosis, Cancer (HCC), CHF (congestive heart failure) (HCC), Cirrhosis of liver (HCC), Clotting disorder, Compression fx, lumbar spine (HCC), Elevated blood pressure reading without diagnosis of hypertension, Headache, Liver transplant recipient [facility]), Lumbago, Peripheral vascular disease, Polyp of colon, Pulmonary embolism (HCC), Type II or unspecified type diabetes mellitus without mention of complication, not stated as uncontrolled, and Unspecified arthropathy, ankle and foot.  PSH:    Past Surgical History:  Procedure Laterality Date   CATARACT EXTRACTION W/PHACO Left 12/02/2023   Procedure: PHACOEMULSIFICATION, CATARACT, WITH IOL INSERTION 5.57, 00:43.9;  Surgeon: Jaye Fallow, MD;  Location: [facility];  Service: Ophthalmology;  Laterality: Left;   CATARACT EXTRACTION W/PHACO Right 12/16/2023   Procedure: PHACOEMULSIFICATION, CATARACT, WITH IOL INSERTION 4.92 00:32.4;  Surgeon: Jaye Fallow, MD;  Location: [facility];  Service: Ophthalmology;  Laterality: Right;   COLONOSCOPY WITH PROPOFOL  N/A 11/10/2019   Procedure: COLONOSCOPY WITH PROPOFOL ;  Surgeon: Toledo, Ladell POUR, MD;  Location: [facility];  Service: Gastroenterology;  Laterality: N/A;   CYSTOSCOPY W/ RETROGRADES Bilateral 11/26/2021   Procedure: CYSTOSCOPY WITH RETROGRADE PYELOGRAM;  Surgeon: Penne Knee, MD;  Location: [facility];  Service: Urology;   Laterality: Bilateral;   CYSTOSCOPY WITH BIOPSY N/A 11/26/2021   Procedure: CYSTOSCOPY WITH BLADDER BIOPSY;  Surgeon: Penne Knee, MD;  Location: [facility];  Service: Urology;  Laterality: N/A;   FOOT SURGERY Left    FRACTURE SURGERY     HAND SURGERY Right    IR RADIOLOGIST EVAL & MGMT  11/08/2021   IR RADIOLOGIST EVAL & MGMT  02/11/2022   KYPHOPLASTY N/A 07/11/2016   Procedure: LUMBAR 2 KYPHOPLASTY;  Surgeon: Oneil Priestly, MD;  Location: [facility];  Service: Orthopedics;  Laterality: N/A;  LUMBAR 2 KYPHOPLASTY   KYPHOPLASTY  2021   LAMINECTOMY  ~1998   LIVER TRANSPLANT     RADIOLOGY WITH ANESTHESIA N/A 01/09/2022   Procedure: CT MICROWAVE ABLATION;  Surgeon: Johann Sieving, MD;  Location: [facility];  Service: Radiology;  Laterality: N/A;    Current Outpatient Medications  Medication Sig Dispense Refill   apixaban (ELIQUIS) 2.5 MG TABS tablet Take 2.5 mg by mouth 2 (two) times daily.     calcium carbonate (OS-CAL) 1250 (500 Ca) MG chewable tablet Chew 1 tablet by mouth daily.     Continuous Glucose Receiver (DEXCOM G7 RECEIVER) DEVI Use as directed 1 each 0   Continuous Glucose Sensor (DEXCOM G7 SENSOR) MISC Apply sensor every 10 days 9 each 3   ferrous sulfate 325 (65 FE) MG tablet Take by mouth.     magnesium oxide (MAG-OX) 400 MG tablet Take by mouth.     mycophenolate (CELLCEPT) 250 MG capsule Take 750 mg by mouth 2 (two) times daily.     tacrolimus  (PROGRAF ) 1 MG capsule Take 2 mg by mouth 2 (two) times daily.     VEMLIDY 25 MG tablet Take 25 mg by mouth daily.     No current facility-administered medications for this visit.    Allergies:   Iodinated contrast media, Flexeril  [cyclobenzaprine ], Iodine , Tramadol , Vioxx [rofecoxib], Metformin and related, and Valium  [diazepam ]   Social History:  The patient  reports that he has quit smoking. His smoking use included cigarettes. He has never used smokeless tobacco. He reports that he does not currently use alcohol. He reports that he  does not use drugs.   Family History:   family history includes Alzheimer's disease in his mother; Breast cancer in his mother and sister; Cancer in his mother; Diabetes in his mother; Stroke in his father.    Review of Systems: Review of Systems  Constitutional: Negative.   HENT: Negative.    Respiratory: Negative.    Cardiovascular: Negative.   Gastrointestinal: Negative.   Musculoskeletal: Negative.   Neurological: Negative.   Psychiatric/Behavioral: Negative.    All other systems reviewed and are negative.   PHYSICAL EXAM: VS:  BP 130/70 (BP Location: Right Arm, Patient Position: Sitting, Cuff Size: Normal)   Pulse 62   Ht 5' 11 (1.803 m)   Wt 262 lb (118.8 kg)   SpO2 97%   BMI 36.54 kg/m  , BMI Body mass index is 36.54 kg/m. GEN: Well nourished, well developed, in no acute distress HEENT: normal Neck: no JVD, carotid bruits, or masses Cardiac: RRR; no murmurs, rubs, or gallops,no edema  Respiratory:  clear to auscultation bilaterally, normal work of breathing GI: soft, nontender, nondistended, + BS MS: no deformity  or atrophy Skin: warm and dry, no rash Neuro:  Strength and sensation are intact Psych: euthymic mood, full affect  Recent Labs: No results found for requested labs within last 365 days.    Lipid Panel Lab Results  Component Value Date   CHOL 227 (H) 02/11/2023   HDL 27.90 (L) 02/11/2023   LDLCALC 127 (H) 02/11/2023   TRIG 361.0 (H) 02/11/2023      Wt Readings from Last 3 Encounters:  04/05/24 262 lb (118.8 kg)  02/11/24 252 lb (114.3 kg)  02/05/24 252 lb 12.8 oz (114.7 kg)     ASSESSMENT AND PLAN:  Problem List Items Addressed This Visit       Cardiology Problems   PAD (peripheral artery disease) - Primary   Relevant Orders   EKG 12-Lead (Completed)   HTN (hypertension)   Relevant Orders   EKG 12-Lead (Completed)     Other   Diabetes mellitus without complication (HCC)   Pedal edema   PAD Mild diffuse aortic atherosclerosis  extending into the iliacs on CT scan 2023 -Denies claudication type symptoms - Non-smoker, A1c better controlled over the past several years - Recommended he start Zetia  10 mg daily to achieve goal LDL less than 70, preferably less than 55 - History of statin intolerance - May need to consider Repatha  Hyperlipidemia Reports statin intolerance -Recommend he start Zetia  10 mg daily He will check to his medication with his transplant team at [facility]  Coronary calcification Very mild coronary calcification noted in the LAD Denies anginal symptoms No plans for ischemic workup  Liver transplant 2 years ago, followed by [facility] , [facility] Reports overall he has been doing well    Signed, Velinda Lunger, M.D., Ph.D. [facility] [place], [place] [phone]

## 2024-04-05 ENCOUNTER — Ambulatory Visit: Attending: Cardiovascular Disease | Admitting: Cardiovascular Disease

## 2024-04-05 ENCOUNTER — Encounter: Payer: Self-pay | Admitting: Cardiovascular Disease

## 2024-04-05 VITALS — BP 130/70 | HR 62 | Ht 71.0 in | Wt 262.0 lb

## 2024-04-05 DIAGNOSIS — I739 Peripheral vascular disease, unspecified: Secondary | ICD-10-CM | POA: Insufficient documentation

## 2024-04-05 DIAGNOSIS — I10 Essential (primary) hypertension: Secondary | ICD-10-CM | POA: Diagnosis not present

## 2024-04-05 DIAGNOSIS — R600 Localized edema: Secondary | ICD-10-CM | POA: Diagnosis not present

## 2024-04-05 DIAGNOSIS — E119 Type 2 diabetes mellitus without complications: Secondary | ICD-10-CM | POA: Diagnosis not present

## 2024-04-05 MED ORDER — EZETIMIBE 10 MG PO TABS: 10.0000 mg | ORAL_TABLET | Freq: Every day | ORAL | 3 refills | Status: AC

## 2024-04-05 NOTE — Patient Instructions (Addendum)
 Medication Instructions:   Please start zetia   (ezetimibe ) 10 mg daily Drop cholesterol 20%  If you need a refill on your cardiac medications before your next appointment, please call your pharmacy.   Lab work: No new labs needed  Testing/Procedures: No new testing needed  Follow-Up: At [facility], you and your health needs are our priority.  As part of our continuing mission to provide you with exceptional heart care, we have created designated Provider Care Teams.  These Care Teams include your primary Cardiologist (physician) and Advanced Practice Providers (APPs -  Physician Assistants and Nurse Practitioners) who all work together to provide you with the care you need, when you need it.  You will need a follow up appointment as needed  Providers on your designated Care Team:   Lonni Meager, NP Bernardino Bring, PA-C [place], [place]  COVID-19 Vaccine Information can be found at: [email or web address] For questions related to vaccine distribution or appointments, please email [email or web address] or call [phone].

## 2024-07-30 ENCOUNTER — Other Ambulatory Visit

## 2024-08-06 ENCOUNTER — Encounter: Admitting: Family Medicine

## 2025-02-11 ENCOUNTER — Ambulatory Visit
